# Patient Record
Sex: Female | Born: 1955 | Race: White | Marital: Married | Smoking: Former smoker
Health system: Northeastern US, Academic
[De-identification: ages and names within clinical notes are randomized; demographics above are authoritative.]

## PROBLEM LIST (undated history)

## (undated) DIAGNOSIS — N390 Urinary tract infection, site not specified: Secondary | ICD-10-CM

## (undated) DIAGNOSIS — N3010 Interstitial cystitis (chronic) without hematuria: Secondary | ICD-10-CM

## (undated) DIAGNOSIS — B019 Varicella without complication: Secondary | ICD-10-CM

## (undated) DIAGNOSIS — N189 Chronic kidney disease, unspecified: Secondary | ICD-10-CM

## (undated) DIAGNOSIS — H3581 Retinal edema: Secondary | ICD-10-CM

## (undated) HISTORY — DX: Chronic kidney disease, unspecified: N18.9

## (undated) HISTORY — PX: BUNIONECTOMY: SHX129

## (undated) HISTORY — DX: Varicella without complication: B01.9

## (undated) HISTORY — DX: Interstitial cystitis (chronic) without hematuria: N30.10

## (undated) HISTORY — PX: HX TONSILLECTOMY/ADENOIDECTOMY: SHX292

## (undated) HISTORY — DX: Retinal edema: H35.81

## (undated) HISTORY — PX: RETINAL DETACHMENT SURGERY: SHX105

## (undated) HISTORY — PX: OTHER SURGICAL HISTORY: SHX169

## (undated) HISTORY — PX: EYE SURGERY: SHX253

## (undated) HISTORY — DX: Urinary tract infection, site not specified: N39.0

---

## 2008-09-11 DIAGNOSIS — N3090 Cystitis, unspecified without hematuria: Secondary | ICD-10-CM | POA: Insufficient documentation

## 2009-05-16 ENCOUNTER — Encounter: Payer: Self-pay | Admitting: Cardiology

## 2009-05-16 HISTORY — PX: OTHER SURGICAL HISTORY: SHX169

## 2009-06-14 ENCOUNTER — Ambulatory Visit
Admit: 2009-06-14 | Discharge: 2009-06-14 | Disposition: A | Discharge status: Home / Self Care | Payer: Self-pay | Source: Ambulatory Visit | Attending: Primary Care | Admitting: Primary Care

## 2009-06-20 ENCOUNTER — Ambulatory Visit
Admit: 2009-06-20 | Discharge: 2009-06-20 | Disposition: A | Discharge status: Home / Self Care | Payer: Self-pay | Source: Ambulatory Visit | Attending: Retina Ophthalmology | Admitting: Retina Ophthalmology

## 2009-06-20 ENCOUNTER — Encounter: Payer: Self-pay | Admitting: Cardiovascular Disease

## 2009-06-20 ENCOUNTER — Ambulatory Visit
Admit: 2009-06-20 | Discharge: 2009-06-20 | Disposition: A | Discharge status: Home / Self Care | Payer: Self-pay | Source: Ambulatory Visit | Admitting: Retina Ophthalmology

## 2009-06-20 HISTORY — PX: OTHER SURGICAL HISTORY: SHX169

## 2009-06-21 ENCOUNTER — Ambulatory Visit
Admit: 2009-06-21 | Discharge: 2009-06-21 | Disposition: A | Discharge status: Home / Self Care | Payer: Self-pay | Source: Ambulatory Visit | Attending: Retina Ophthalmology | Admitting: Retina Ophthalmology

## 2009-06-25 ENCOUNTER — Ambulatory Visit: Payer: Self-pay | Admitting: Retina Ophthalmology

## 2009-07-01 ENCOUNTER — Ambulatory Visit: Payer: Self-pay | Admitting: Urology

## 2009-07-30 ENCOUNTER — Ambulatory Visit: Payer: Self-pay | Admitting: Retina Ophthalmology

## 2009-07-30 HISTORY — PX: OTHER SURGICAL HISTORY: SHX169

## 2009-08-27 ENCOUNTER — Ambulatory Visit: Payer: Self-pay | Admitting: Retina Ophthalmology

## 2009-10-08 ENCOUNTER — Ambulatory Visit: Payer: Self-pay | Admitting: Retina Ophthalmology

## 2009-10-18 ENCOUNTER — Encounter: Payer: Self-pay | Admitting: Gastroenterology

## 2009-10-18 ENCOUNTER — Ambulatory Visit: Payer: Self-pay | Admitting: Primary Care

## 2009-10-19 NOTE — H&P (Signed)
 Reason For Visit   Physical- patient also has cold sx.  cough,body aches, chills.  HPI   This patient is planning eye surgery on next Thursday.  Patient can  walk   up two flights of stairs without resting.  She does not smoke.  This   patient does not have a history of  chest   discomfort,cough,wheeze,edema,lightheadedness,palpitations.  She does not   have a history of heart failure, revascularization,cva,valvular heart   disease or lung disease.  She has never had profuse bleeding from small   wounds,after dental surgery or after surgery,excessive bruising, bleeding   into a muscle, frequent nosebleeds, bleeding into a joint, f amily members   with history of bleeding disorders or who bleed easily.  Medications and   supplements were reviewed.  There are no supplements taken except usana   products biomega, mega antioxidents, chelated minerals, visonex.  There are   no family members with severe fevers after anesthesia or other anesthesia   problems and this patient has had no post operative fevers.  [name] is   a 54 year woman who is here for a physical..  She never had trouble with   her blood pressure.  Her cholesterol has never been checked.  She never had   broken bones as an adult. Her height has not changed, it was 5'2".  Her   periods are regular. She uses partner vasectomy to prevent pregnancy.. She   has no trouble falling asleep and no trouble staying asleep. She has   nocturia : 1-2 times.     She is in a relationship.  She has 2  child, She has  pets 1 dog.  She is   currently sexually active with men.       [name] last had a pap in 2008.  She last had a mammogram in 2009.  She   has never had a dexa.  She last had a colonoscopy at about 46.  She is   supposed to have one every 10 years.      She works asoffice work.  She spends her free time walk dog, skidoo, read,   new house projects.   She always wears her seatbelt.  She last had a   tetanus shot 2002. She has never had a pneumovax.   She has never had hpv   vaccine.  [name] has quit smoking. She quit smoking 30    ago after   starting at age 33 and smoking 1/2 ppd. She drinks  alcohol weekly.  She   typically has 1-2 drinks at one time.  She uses no substances.  She    exercises  by walking two miles a day.     She denies depression or anxiety.  She has no history of interpersonal   violence.       She has not fallen since her last visit to the doctor.  Allergies   No Known Drug Allergy.  Current Meds   Elmiron 100 MG Capsule;TAKE 1 CAPSULE 3 TIMES DAILY.; Rx  Trimethoprim 100 MG Tablet;TAKE 1 TABLET DAILY; Rx  Tamsulosin HCl 0.4 MG Capsule;TAKE 1 CAPSULE DAILY; Rx  Detrol LA 4 MG Capsule Extended Release 24 Hour;TAKE 1 CAPSULE DAILY.; Rx  PrednisoLONE Acetate 1 % Suspension;; RPT  Durezol 0.05 % Emulsion;; RPT  HYDROmorphone HCl 2 MG Tablet;; RPT  Hydrocodone-Acetaminophen 5-500 MG Tablet;; RPT  Atropine Sulfate 1 % Solution;; RPT  Polymyxin B-Trimethoprim 10000-0.1 UNIT/ML-% Solution;; RPT.  Active Problems   Cystitis (595.9).  PSH   Cesarean Section  Hallux Valgus (Bunion) Correction; Bilateral  Tonsillectomy.  Family Hx   Paternal history of Diabetes Mellitus  Family history of Heart Disease  Family history of Hypertension.  Health Mgmt Plan   Colonoscopy every 10 years; for HEALTH MAINTENANCE  Mammogram every 1 year; for HEALTH MAINTENANCE; Overdue  Pap Smear every 3 years; for HEALTH MAINTENANCE; Overdue  Td Immunization every 10 years; for HEALTH MAINTENANCE.  Vital Signs   Recorded by Sprague,Courtney on 18 Oct 2009 09:41 AM  BP:128/72,   HR: 64 b/min,   Weight: 192.3 lb.  Physical Exam   GENERAL APPEARANCE: Normal Habitus.  Well-developed, well groomed.  Appears   stated age.  No acute distress.  Color good.  MENTAL STATUS: Appears alert and oriented.  Affect appropriate.  SKIN: Skin color and turgor normal.  No suspicious lesions, masses, rashes,   or ulcerations.  Nails and hair appear normal.  HEAD: Normocephalic.  EYE: normal  conjunctiva, pupils equal  EAR: External ear without scars, masses or lesions.   Acuity to   conversational tones good.  OROPHARYNX: Moist   NECK: Symmetric, trachea midline.  Full range of motion without pain or   tenderness.  Thyroid nontender without enlargement or masses.  Carotid   pulses normal without bruits.  No cervical lymph adenopathy.  CHEST: Respirations unlabored with normal diaphragmatic excursion.  Chest   wall symmetric with no masses.  Breath sounds clear bilaterally without   wheezes, rubs, rales, or rhonchi.  CV: Normal S1 and S2 without murmur or gallop or click.  Capillary refill   within two seconds.  No clubbing, cyanosis, or edema.    EXTREMITIES: Joints with full range of motion, without tenderness,   crepitance, or contracture.  No obvious joint deformities or effusions.  NEUROLOGICAL: Cranial nerves II through XII intact.  Motor strength   symmetrical with no obvious weakness.  Superficial sensation intact   bilaterally to light touch.  Observed dexterity without ataxia or tremor.    Gait coordinated and smooth.  Results   EKG NSR at 80 PR 0.16 QRS 0.08 STT nl st waves.  Assessment   DISCUSSION 54 year old with VIRAL URI, INTERSTITIAL CYSTITIS, CATARACTS     ASSESSMENT/PLAN      DISCUSSION VIRAL SYNDROME      cough  Xopenex two puffs  QID  MDI 2 puffs QIDspacer given  nighttime cough - Promethazine with codeine one to two teas every four to   six hours while trying to sleep     Ibuprofen 800 mg po Q8 hrs  as needed for discomfort or     Tylenol 650 mg po Q6 hrs  as needed for discomfort     Follow-up immediately for increased work of breathing, being out of it,   pain, or if without gradual improvement after 7 days.     INTERSTIAL CYTITIS - managment per urology, good control currently     CATARACTS - okay for surgery     HEALTH CARE MAINTENANCE  Cardiac Risk Factors -- there is no known coronary disease or no coronary   disease equivalent, no diabetes, no hyperglycemia, no hypertension,  no   family history of coronary artery disease, and no high cholesterol, no   tobacco or history of tobacco.  We will check a BMP/lipids/LFTs.     Vaccines -- last tetanus was 2002,  This  patient is too old for Gardisil and too young  for Pneumovax.       Exposures -- there is no history of sexually transmitted disease, no IV   drug abuse, no inhaled drugs, no incarceration, no homelessness, no   immigration, no group home living, no end-stage renal disease, no   gastrectomy.  Therefore there is no need for a PPD, HIV, hepatitis B or   hepatitis C check.       Female 40 to 64 with normal paps and single partner  Pap and Breast exam, Annual blood pressure, Random blood sugar, annual   lipids, Mammogram and dexa needed.     Patient is to follow-up soon for pap     Educational handouts given.  Nurse, mental health   Electronically signed by: Karen Kays  M.D.; 10/19/2009 1:55 PM EST.

## 2009-10-24 ENCOUNTER — Ambulatory Visit: Payer: Self-pay | Admitting: Retina Ophthalmology

## 2009-10-24 ENCOUNTER — Ambulatory Visit
Admit: 2009-10-24 | Discharge: 2009-10-24 | Disposition: A | Discharge status: Home / Self Care | Payer: Self-pay | Source: Ambulatory Visit | Attending: Retina Ophthalmology | Admitting: Retina Ophthalmology

## 2009-10-24 HISTORY — PX: CATARACT REMOVAL: SUR2

## 2009-10-24 HISTORY — PX: OTHER SURGICAL HISTORY: SHX169

## 2009-10-25 ENCOUNTER — Ambulatory Visit: Payer: Self-pay | Admitting: Retina Ophthalmology

## 2009-10-25 LAB — POCT URINE PREGNANCY (LAB): Preg Test,UR POC: NEGATIVE m[IU]/mL

## 2009-10-28 MED FILL — Atropine Sulfate Ophth Soln 1%: OPHTHALMIC | Qty: 2 | Status: AC

## 2009-10-29 ENCOUNTER — Ambulatory Visit
Admit: 2009-10-29 | Discharge: 2009-10-29 | Disposition: A | Discharge status: Home / Self Care | Payer: Self-pay | Source: Ambulatory Visit | Attending: Primary Care | Admitting: Primary Care

## 2009-10-29 LAB — HEPATIC FUNCTION PANEL
ALT: 27 U/L (ref 0–35)
AST: 27 U/L (ref 0–35)
Albumin: 4.2 g/dL (ref 3.5–5.2)
Alk Phos: 84 U/L (ref 35–105)
Bilirubin,Direct: <0.2 mg/dL (ref 0.0–0.3)
Bilirubin,Total: 0.4 mg/dL (ref 0.0–1.2)
Total Protein: 6.7 g/dL (ref 6.3–7.7)

## 2009-10-29 LAB — LIPID PANEL
Chol/HDL Ratio: 4.5
Cholesterol: 207 mg/dL — AB
HDL: 46 mg/dL
LDL Calculated: 117 mg/dL
Non HDL Cholesterol: 161 mg/dL
Triglycerides: 221 mg/dL — AB

## 2009-10-29 LAB — BASIC METABOLIC PANEL
Anion Gap: 11 (ref 7–16)
CO2: 25 mmol/L (ref 20–28)
Calcium: 9 mg/dL (ref 8.6–10.2)
Chloride: 103 mmol/L (ref 96–108)
Creatinine: 0.76 mg/dL (ref 0.51–0.95)
GFR,Black: >59 *
GFR,Caucasian: >59 *
Glucose: 92 mg/dL (ref 74–106)
Lab: 9 mg/dL (ref 6–20)
Potassium: 4.5 mmol/L (ref 3.3–5.1)
Sodium: 139 mmol/L (ref 133–145)

## 2009-10-30 LAB — HEMOGLOBIN A1C: Hemoglobin A1C: 6 % (ref 4.0–6.0)

## 2009-11-01 ENCOUNTER — Ambulatory Visit: Payer: Self-pay | Admitting: Retina Ophthalmology

## 2009-11-04 ENCOUNTER — Ambulatory Visit: Payer: Self-pay | Admitting: Primary Care

## 2009-11-04 NOTE — Progress Notes (Signed)
Reason For Visit   Veronica Bishop is a 54 year who presents for well Gyn visit.  Current Meds   Xopenex HFA 45 MCG/ACT Aerosol;INHALE 2 PUFFS EVERY 6 HOURS AS NEEDED.; Rx  Promethazine-Codeine 6.25-10 MG/5ML Syrup;1 to 2 teaspoons every four to   six hours while trying to sleep for cough; Rx  Detrol LA 4 MG Capsule Extended Release 24 Hour;TAKE 1 CAPSULE DAILY.; Rx  Tamsulosin HCl 0.4 MG Capsule;TAKE 1 CAPSULE DAILY; Rx  Elmiron 100 MG Capsule;TAKE 1 CAPSULE 3 TIMES DAILY.; Rx  Trimethoprim 100 MG Tablet;TAKE 1 TABLET DAILY; Rx.  Allergies   No Known Drug Allergy.  OB/GYN Hx   CATAMENIA:   --Cycle:   --Flow:      MENOPAUSE:   ; natural     SEXUAL HISTORY:  active   --# partners last 6 months: 1   --Current # of partners: 1   --Habits: female; vaginal   --Symptoms: none     STD HISTORY: none  HIV HISTORY: never   HIV RISK FACTORS: none      GU SYMPTOMS: none              --dysuria       --Incontinence - mixed   ; normal     BREAST SYMPTOMS:none  ; normal     ENDO SYMPTOMS: none.  Family Hx   Paternal history of Diabetes Mellitus  Family history of Heart Disease  Family history of Hypertension.  Vital Signs   Recorded by amccoy on 04 Nov 2009 07:55 AM  BP:120/62,   HR: 71 b/min,   Height: 62 in, Weight: 194.4 lb, BMI: 35.6 kg/m2,   O2 Sat: 96 (%SpO2).  Physical Exam   GENERAL APPEARANCE: Normal Habitus.  Well-developed, well groomed.  Appears   stated age.  No acute distress.  Color good.  MENTAL STATUS: Appears alert and oriented.  Affect appropriate.  SKIN: Skin color and turgor normal.  No suspicious lesions, masses, rashes,   or ulcerations.  Nails and hair appear normal.  HEAD: Normocephalic.  EYE: normal conjunctiva, pupils equal and reactive, fundi benign  EAR: External ear without scars, masses or lesions.  External auditory   canal intact, clear, and without lesions.  Tympanic membranes intact with   normal light reflex and landmarks.  Acuity to conversational tones good.  MOUTH: Teeth in good repair.  Gums pink without  lesions.  Normal appearing   mucosa, palate, and tongue.  OROPHARYNX: Moist without exudate, erythema, or swelling.  NECK: Symmetric, trachea midline.  Full range of motion without pain or   tenderness.  Thyroid nontender without enlargement or masses.  Carotid   pulses normal without bruits.  No cervical lymph adenopathy.  CHEST: Respirations unlabored with normal diaphragmatic excursion.  Chest   wall symmetric with no masses.  Breath sounds clear bilaterally without   wheezes, rubs, rales, or rhonchi.  BREAST: normal without masses or discharge, without skin changes  CV: Normal S1 and S2 without murmur or gallop or click.  Capillary refill   within two seconds.  No clubbing, cyanosis, or edema.  No varicosities.    Radial, femoral, dorsalis pedis, and posterior tibial pulses full and   symmetrical.  GI/ABDOMEN: Abdomen soft with normal bowel sounds.  No guarding or rebound.    No palpable masses or tenderness.  Liver and spleen are without tenderness   or enlargement.  No aortic widening.  No inguinal adenopathy.  GU: Normal vulva and introitus without masses lesions, rashes or  swelling.    Vaginal mucosa and cervix pink and moist without exudate, lesions, or   ulcerations.  Uterus and adnexae palpable and not enlarged, nontender, and   without masses.    RECTAL: Perineum and anus without lesions, masses or hemorrhoids.    EXTREMITIES: Joints with full range of motion, without tenderness,   crepitance, or contracture.  No obvious joint deformities or effusions.  NEUROLOGICAL: Cranial nerves II through XII intact.  Motor strength   symmetrical with no obvious weakness.  Superficial sensation intact   bilaterally to light touch.  Observed dexterity without ataxia or tremor.    Gait coordinated and smooth.     .  Results   BASIC METABOLIC PROFILE - BMP   29 Oct 2009 08:41 AM  -   SODIUM: 139 mmol/L  -   UREA NITROGEN: 9 mg/dL  -   CREATININE: 1.61 mg/dL  -   GLUCOSE: 92 mg/dL  -   CALCIUM: 9.0 mg/dL  HEPATIC  FUNCTION PROFILE - LIVER   29 Oct 2009 08:41 AM  -   AST: 27 u/l  -   ALT: 27 u/l  LIPID PROFILE - LIPID   29 Oct 2009 08:41 AM  -   CHOLESTEROL: 207 mg/dL  -   TRIGLYCERIDES: 096 mg/dL  -   HDL: 46 mg/dL  -   LDL (CALC): 045 mg/dL  -   CHOL/HDL RATIO: 4.5   -   NON HDL CHOLESTEROL: 161 mg/dL  HEMOGLOBIN W0J - [id number]   29 Oct 2009 08:41 AM  -   HGB A1C: 6.0 %.  Assessment/Plan   DISCUSSION 54 year old with INTERSTITIAL CYSTITIS, RETINAL DETATCHMENTS,   CATARACTS     pap done.       preDM - encouraged to watch carbs and exercise.  Will recheck HgA1c and   lipids in six months.  LDL goal would be less than 90.  currently 117.  Signature   Electronically signed by: Karen Kays  M.D.; 11/04/2009 11:27 AM EST.

## 2009-11-06 LAB — GYN CYTOLOGY

## 2009-12-09 ENCOUNTER — Ambulatory Visit
Admit: 2009-12-09 | Discharge: 2009-12-09 | Disposition: A | Discharge status: Home / Self Care | Payer: Self-pay | Source: Ambulatory Visit | Attending: Urology | Admitting: Urology

## 2009-12-10 ENCOUNTER — Ambulatory Visit: Payer: Self-pay | Admitting: Retina Ophthalmology

## 2009-12-11 LAB — AEROBIC CULTURE

## 2009-12-30 ENCOUNTER — Ambulatory Visit: Payer: Self-pay | Admitting: Urology

## 2009-12-31 LAB — AEROBIC CULTURE: Aerobic Culture: 0

## 2010-01-17 ENCOUNTER — Ambulatory Visit: Payer: Self-pay

## 2010-01-17 ENCOUNTER — Ambulatory Visit: Payer: Self-pay | Admitting: Retina Ophthalmology

## 2010-02-26 ENCOUNTER — Ambulatory Visit
Admit: 2010-02-26 | Discharge: 2010-02-26 | Disposition: A | Discharge status: Home / Self Care | Payer: Self-pay | Source: Ambulatory Visit | Attending: Urology | Admitting: Urology

## 2010-02-28 ENCOUNTER — Ambulatory Visit: Payer: Self-pay | Admitting: Retina Ophthalmology

## 2010-02-28 LAB — AEROBIC CULTURE

## 2010-03-13 ENCOUNTER — Ambulatory Visit
Admit: 2010-03-13 | Discharge: 2010-03-13 | Disposition: A | Discharge status: Home / Self Care | Payer: Self-pay | Source: Ambulatory Visit | Attending: Urology | Admitting: Urology

## 2010-03-15 LAB — AEROBIC CULTURE

## 2010-04-03 ENCOUNTER — Ambulatory Visit: Payer: Self-pay | Admitting: Primary Care

## 2010-04-03 ENCOUNTER — Ambulatory Visit
Admit: 2010-04-03 | Discharge: 2010-04-03 | Disposition: A | Discharge status: Home / Self Care | Payer: Self-pay | Source: Ambulatory Visit | Attending: Primary Care | Admitting: Primary Care

## 2010-04-03 LAB — BASIC METABOLIC PANEL
Anion Gap: 8 (ref 7–16)
CO2: 27 mmol/L (ref 20–28)
Calcium: 9.2 mg/dL (ref 8.6–10.2)
Chloride: 105 mmol/L (ref 96–108)
Creatinine: 0.77 mg/dL (ref 0.51–0.95)
GFR,Black: >59 *
GFR,Caucasian: >59 *
Glucose: 88 mg/dL (ref 74–106)
Lab: 9 mg/dL (ref 6–20)
Potassium: 4.4 mmol/L (ref 3.3–5.1)
Sodium: 140 mmol/L (ref 133–145)

## 2010-04-04 LAB — HEMOGLOBIN A1C: Hemoglobin A1C: 5.9 % (ref 4.0–6.0)

## 2010-04-11 ENCOUNTER — Ambulatory Visit: Payer: Self-pay | Admitting: Primary Care

## 2010-04-12 NOTE — Progress Notes (Signed)
Reason For Visit   Follow up lab results.  HPI   Daughter got engaged.  Moving to [place].  Got laid off in May.  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  Had anaphylaxis.    She thinks it was the soap in a towel.     This patient is here to follow up on the following chronic problems:     DYSLIPIDEMIA   She is watching her diet.     preDIABETES    She saw ophthalmology and is on multiple eye drops.  She has trouble with   her vision.  She is exercising more.  She is watching her diet carefully,   her fiance is watching his diet due to htn.        She sees Dr. Ron Agee for her bladder problems and is developing increasing   microbladder and will have to have urethral dilitation but needs dense   sedation for this.  Allergies   Sulfa Drugs.  Current Meds   Promethazine-Codeine 6.25-10 MG/5ML Syrup;1 to 2 teaspoons every four to   six hours while trying to sleep for cough; Rx  Xopenex HFA 45 MCG/ACT Aerosol;INHALE 2 PUFFS EVERY 6 HOURS AS NEEDED.; Rx  Ciprofloxacin HCl 500 MG Tablet;TAKE 1 TABLET EVERY 12 HOURS DAILY.; Rx  Ciprofloxacin HCl 500 MG Tablet;TAKE 1 TABLET EVERY 12 HOURS; Rx  Ciprofloxacin HCl 500 MG Tablet;TAKE 1 TABLET EVERY 12 HOURS; Rx  Nitrofurantoin Monohyd Macro 100 MG Capsule;TAKE 1 CAPSULE EVERY 12 HOURS   DAILY.; Rx  Phenazopyridine HCl 200 MG Tablet;TAKE 1 TABLET 3 TIMES DAILY AFTER MEALS.;   Rx  Trimethoprim 100 MG Tablet;TAKE 1 TABLET DAILY; Rx  Ketorolac Tromethamine 0.5 % Solution;; RPT  Tamsulosin HCl 0.4 MG Capsule;TAKE 1 CAPSULE DAILY; Rx  Detrol LA 4 MG Capsule Extended Release 24 Hour;TAKE 1 CAPSULE DAILY; Rx  Elmiron 100 MG Capsule;TAKE 1 CAPSULE 3 TIMES DAILY.; Rx.  Active Problems   Cystitis (595.9).  ROS   This patient denies side effects  Does NOT check blood pressure.  Has NO chest pain  Has NO sob  Labs were drawn.  Has nocturia zero to one times.  No foot pain, no broken skin or sores on the feet.  .  Vital Signs   Recorded by Martorana,Christina on 11 Apr 2010 02:06 PM  BP:122/72,   HR: 80 b/min,  L Radial, Normal,   Height: 62 in, Weight: 194.2 lb, BMI: 35.5 kg/m2.  Results   BASIC METABOLIC PROFILE - BMP   03 Apr 2010 09:43 AM  -   UREA NITROGEN: 9 mg/dL  -   CREATININE: 1.61 mg/dL  -   GLUCOSE: 88 mg/dL  HEMOGLOBIN W9U - EAV4U   03 Apr 2010 09:43 AM  -   HGB A1C: 5.9 %  BASIC METABOLIC PROFILE - BMP   29 Oct 2009 08:41 AM  -   UREA NITROGEN: 9 mg/dL  -   CREATININE: 9.81 mg/dL  HEPATIC FUNCTION PROFILE - LIVER   29 Oct 2009 08:41 AM  -   AST: 27 u/l  -   ALT: 27 u/l  LIPID PROFILE - LIPID   29 Oct 2009 08:41 AM  -   CHOLESTEROL: 207 mg/dL  -   TRIGLYCERIDES: 191 mg/dL  -   HDL: 46 mg/dL  -   LDL (CALC): 478 mg/dL  -   CHOL/HDL RATIO: 4.5   -   NON HDL CHOLESTEROL: 161 mg/dL  HEMOGLOBIN A1C - HBA1C   29 Oct 2009 08:41 AM  -   HGB A1C: 6.0 %.  Assessment   DISCUSSION 54 YO with ANAPHYLAXIS, CHRONIC CYSTITIS, DYSLIPIDEMIA, preDM     ANAPHYLAXIS - epipen prescribed.  It isn't clear what she is allergic to.     CHRONIC CYSTITIS - evaluation per Dr. Ron Agee; may need dilitation of   urethra     DYSLIPIDEMIA - triglycerides are a bit high and watch diet and exercise.     pre DM - watch diet and exercise.  Slight improvement 5.9 from 6.0.    Recheck in six months.     HCM - needs mamogram.  Signature   Electronically signed by: Karen Kays  M.D.; 04/12/2010 4:48 PM EST.

## 2010-04-14 ENCOUNTER — Ambulatory Visit
Admit: 2010-04-14 | Discharge: 2010-04-14 | Disposition: A | Discharge status: Home / Self Care | Payer: Self-pay | Source: Ambulatory Visit | Attending: Urology | Admitting: Urology

## 2010-04-15 LAB — AEROBIC CULTURE

## 2010-05-01 ENCOUNTER — Ambulatory Visit
Admit: 2010-05-01 | Discharge: 2010-05-01 | Disposition: A | Discharge status: Home / Self Care | Payer: Self-pay | Source: Ambulatory Visit | Attending: Urology | Admitting: Urology

## 2010-05-04 LAB — AEROBIC CULTURE

## 2010-05-14 ENCOUNTER — Ambulatory Visit: Payer: Self-pay | Admitting: Urology

## 2010-05-15 LAB — AEROBIC CULTURE: Aerobic Culture: 0

## 2010-05-30 ENCOUNTER — Ambulatory Visit: Payer: Self-pay | Admitting: Retina Ophthalmology

## 2010-05-30 HISTORY — PX: OTHER SURGICAL HISTORY: SHX169

## 2010-06-25 ENCOUNTER — Ambulatory Visit: Payer: Self-pay | Admitting: Urology

## 2010-07-02 ENCOUNTER — Ambulatory Visit: Payer: Self-pay | Admitting: Urology

## 2010-07-11 ENCOUNTER — Ambulatory Visit: Payer: Self-pay | Admitting: Retina Ophthalmology

## 2010-07-11 ENCOUNTER — Encounter: Payer: Self-pay | Admitting: Retina Ophthalmology

## 2010-07-21 ENCOUNTER — Ambulatory Visit
Admit: 2010-07-21 | Discharge: 2010-07-21 | Disposition: A | Discharge status: Home / Self Care | Payer: Self-pay | Source: Ambulatory Visit | Attending: Urology | Admitting: Urology

## 2010-07-22 LAB — AEROBIC CULTURE: Aerobic Culture: 0

## 2010-08-11 ENCOUNTER — Ambulatory Visit: Payer: Self-pay | Admitting: Urology

## 2010-08-12 LAB — AEROBIC CULTURE: Aerobic Culture: 0

## 2010-09-01 NOTE — Miscellaneous (Unsigned)
 Continuity of Care Record  Created: todo  From: ,   From:   From: [employer], EHR v10.2.7.53  To: Warnell Bureau  Purpose: Patient Use;       Problems  Diagnosis: Cystitis (595.9)     Family History  Paternal history of Diabetes Mellitus (V18.0)   Family history of Heart Disease (V17.49)   Family history of Hypertension (V17.49)     Alerts  Allergy - Sulfa Drugs     Medications  ALPRAZolam 0.25 MG Tablet; 1 tablet night before procedure.  Repeat morning   of procedure. ; Rx   Cephalexin 250 MG Capsule; TAKE 1 TABLET ONCE DAILY. ; Rx   Detrol LA 4 MG Capsule Extended Release 24 Hour; TAKE 1 CAPSULE DAILY. ; Rx   Elmiron 100 MG Capsule; TAKE 1 CAPSULE 3 TIMES DAILY. ; Rx   EpiPen 0.3 MG/0.3ML Device; INJECT 0.3ML INTRAMUSCULARLY AS DIRECTED ; Rx   Ketorolac Tromethamine 0.5 % Solution ; RPT   Monistat 3 200-2 MG-% (9GM) Kit; INSERT 1 APPLICATOR DAILY ; Rx   Nitrofurantoin Monohyd Macro 100 MG Capsule; TAKE 1 CAPSULE TWICE DAILY. ;   Rx   Promethazine-Codeine 6.25-10 MG/5ML Syrup; 1 to 2 teaspoons every four to   six hours while trying to sleep for cough ; Rx   Tamsulosin HCl 0.4 MG Capsule; TAKE 1 CAPSULE DAILY ; Rx   Xopenex HFA 45 MCG/ACT Aerosol; INHALE 2 PUFFS EVERY 6 HOURS AS NEEDED. ;   Rx     Immunizations  Tdap (Adacel)

## 2010-09-01 NOTE — Letter (Signed)
May 14, 2010    Karen Kays, MD  [address]  [address]  [place], [place]  [address]      RE:   Veronica Bishop, Veronica Bishop  DOB:  1956-01-12  Unit#: [id number]    Dear Dr. Erling Cruz:    I saw [name] in the office on May 14, 2010, for follow-up of her  urinary tract infections and interstitial cystitis.  She was feeling that  she did need to be dilated.  Because of her tremendous anxiety over this  procedure, I did have her premedicate with Xanax.  She wound up tolerating  urethral dilation quite well.  Findings showed no evidence of any specific  intravesical pathology.    She is not on Elmiron at this point due to cost and lack of perceived  benefit.  She is using tamsulosin, which she does find to be helpful.  Urine cultures have recently shown recurrent infections with different  organisms, including enterococcus, klebsiella, and E. coli.  She is on  Trimpex every other day.  This obviously is not helping prevent infection.  I have suggested to her that she discontinue Trimpex and begin Keflex at  250 mg daily.  I have sent a repeat urine culture.  She is currently on  Macrobid for an established urinary tract infection.  She will begin the  Keflex once Trimpex is completed.    Thank you again, Dr. Erling Cruz, for the opportunity to continue to assist in  [name]'s care.  I will keep you advised of her progress.      With Best Personal Regards,              Electronically Signed and Finalized by  [name], MD 05/19/2010 05:42  ____________________________________  [name], MD      DD:   05/15/2010  DT:   05/15/2010 12:45 P  DVI:  [id number]  [id number]    cc:   Karen Kays, MD

## 2010-09-01 NOTE — Letter (Signed)
 August 11, 2010    Karen Kays, MD  [address]  [address]  [place], [place]  [address]      RE:   Veronica Bishop, Veronica Bishop  DOB:  1956-08-23  Unit#: [id number]    Dear Dr. Erling Cruz:    I saw Veronica Bishop in the office on 08/11/2010 for followup of her urinary  tract infections and her interstitial cystitis.  She has had no  breakthrough infections on Keflex at 250 mg once daily.  We will,  therefore, begin tapering her antibiotics.  She will drop down to every  other day at this point.  If after 1 month she is doing well, she will  decrease this further to Mondays and Thursdays.    With respect to her interstitial cystitis, she very definitely feels that  hydrodistention of her bladder was helpful.  She is currently using  tamsulosin and Detrol, both of which she finds useful.  She was requesting  a repeat trial of Elmiron.  I have provided her with a prescription.  I  will follow up with her in 3 months.    Thank you very much, Dr. Erling Cruz, for the opportunity to continue to assist  in [name] care.  I will continue to update you on her progress.      Sincerely,              Electronically Signed and Finalized by  [name], MD 08/14/2010 21:07  ____________________________________  [name], MD      DD:   08/12/2010  DT:   08/13/2010 10:56 A  DVI:  [id number]  [id number]    cc:   Karen Kays, MD

## 2010-09-01 NOTE — Letter (Signed)
Dec 30, 2009    Karen Kays, MD  [address]  [address]  [place], [place]  [address]      RE:   Veronica Bishop, [name]  DOB:  Sep 22, 1955  Unit#: [id number]    Dear Dr. Erling Cruz:    [name] was in the office today in follow-up for interstitial cystitis,  overactive bladder, elevated residual urine, and urinary tract infections.  She is, at this point, on a combination of Elmiron at 100 mg 3 times daily,  Detrol LA 4 mg once daily, and tamsulosin at 0.4 mg daily.  This  combination is keeping her symptoms under good control.  Of the 3  medications, she finds the Detrol to be the most critical.    She is experiencing rare episodes of dyspareunia.  Frequency and urgency  are under good control if she remains on the medications.  Nocturia is  averaging once per night.  Since her last visit with me in November, she  has had only a single urinary tract infection with E. coli on December 09, 2009.  This did follow sexual activity, she believes.  She has been using  Trimpex at 100 mg every other day.    Physical examination reveals no CVA tenderness.   Her abdomen is soft and  benign.  There is no evidence of bladder distention.  Her residual urine by  bladder scan was 103 cc, which is basically stable.    I have suggested to [name] that she continue the medications as  outlined above.  She does go on camping trips.  I have provided her with  prescriptions for Cipro, Macrobid, and Pyridium for those times, in case  she develops an infection while out of the [place] area.    Thank you again, Dr. Erling Cruz, for the opportunity to continue to assist in  Veronica Bishop's care.      With Best Personal Regards,              Electronically Signed and Finalized by  [name], MD 01/01/2010 06:48  ____________________________________  [name], MD      DD:   12/30/2009  DT:   12/31/2009  1:53 P  DVI:  [id number]  [id number]    cc:   Karen Kays, MD

## 2010-09-10 ENCOUNTER — Encounter: Payer: Self-pay | Admitting: Primary Care

## 2010-09-10 ENCOUNTER — Ambulatory Visit: Payer: Self-pay | Admitting: Primary Care

## 2010-09-14 NOTE — Progress Notes (Signed)
Message   Patient is here for swollen gland no sore  throat, and feeling of run down,   ears are plugged.  HPI   Veronica Bishop has popping ears, swollen glands pain with opening mouth and with   swallowing.  She has no fever, no runny nose, no headache.  She has no   cough, no n/v/d.  Current Meds   Promethazine-Codeine 6.25-10 MG/5ML Syrup;1 to 2 teaspoons every four to   six hours while trying to sleep for cough; Rx  Xopenex HFA 45 MCG/ACT Aerosol;INHALE 2 PUFFS EVERY 6 HOURS AS NEEDED.; Rx  Elmiron 100 MG Capsule;TAKE 1 CAPSULE 3 TIMES DAILY.; Rx  Tamsulosin HCl 0.4 MG Capsule;TAKE 1 CAPSULE DAILY; Rx  Detrol LA 4 MG Capsule Extended Release 24 Hour;TAKE 1 CAPSULE DAILY.; Rx  Cephalexin 250 MG Capsule;TAKE 1 TABLET ONCE DAILY.; Rx  Monistat 3 200-2 MG-% (9GM) Kit;INSERT 1 APPLICATOR DAILY; Rx  ALPRAZolam 0.25 MG Tablet;1 tablet night before procedure.  Repeat morning   of procedure.; Rx  Ketorolac Tromethamine 0.5 % Solution;; RPT  EpiPen 0.3 MG/0.3ML DEVI;INJECT 0.3ML INTRAMUSCULARLY AS DIRECTED; Rx.  Active Problems   Cystitis (595.9).  Family Hx   Paternal history of Diabetes Mellitus  Family history of Heart Disease  Family history of Hypertension.  Vital Signs   Recorded by amccoy on 10 Sep 2010 08:09 AM  BP:110/70,   HR: 81 b/min,   Height: 62 in, Weight: 197.8 lb, BMI: 36.2 kg/m2,   O2 Sat: 97 (%SpO2).  Physical Exam   GENERAL APPEARANCE: appears stated age, tired  EYES: conjunctiva benign   EARS: canals normal TM gray and shiny  MOUTH: wet, no exudate, oropharynx clear. Parotid glands swollen and tender   bilaterally.  NECK: supple, 2 to 2 cm adenapathy in the anterior cervical chain  LUNGS: Clear to auscultation, no wheezing or crackles. No retractions.    HEART: regular rhythm and rate with no murmurs, JVP flat  EXTREMITIES: Without clubbing, cyanosis, or edema  NEUROLOGIC: Alert and oriented times three, cranial nerves II through XII   intact by observation, motor/sensory exam appear normal, normal gait.  SKIN:   no rash  PSYCH: alert, well groomed, good eye contact.  Assessment   DISCUSSION 55 yo with bilateral PAROTITIS cw VIRAL ETIOLOGY     Ibuprofen 800 tid prn, fu for fever, redness of glands, increased pain     HCM - tetanus given, needs mammogram.  Health Mgmt Plan   HIV Testing-declined every 1 year; for HEALTH MAINTENANCE.  Signature   Electronically signed by: Karen Kays  M.D.; 09/14/2010 4:06 PM EST.

## 2010-10-10 ENCOUNTER — Ambulatory Visit: Payer: Self-pay | Admitting: Retina Ophthalmology

## 2010-11-10 ENCOUNTER — Ambulatory Visit: Payer: Self-pay | Admitting: Urology

## 2010-12-05 ENCOUNTER — Ambulatory Visit: Payer: Self-pay | Admitting: Retina Ophthalmology

## 2010-12-25 ENCOUNTER — Ambulatory Visit: Payer: Self-pay

## 2010-12-25 ENCOUNTER — Encounter: Payer: Self-pay | Admitting: Retina Ophthalmology

## 2010-12-28 LAB — AEROBIC CULTURE

## 2011-01-02 ENCOUNTER — Ambulatory Visit: Payer: Self-pay | Admitting: Retina Ophthalmology

## 2011-02-16 ENCOUNTER — Encounter: Payer: Self-pay | Admitting: Urology

## 2011-02-16 ENCOUNTER — Ambulatory Visit: Payer: Self-pay | Admitting: Urology

## 2011-02-16 DIAGNOSIS — N3010 Interstitial cystitis (chronic) without hematuria: Secondary | ICD-10-CM

## 2011-02-16 DIAGNOSIS — N3281 Overactive bladder: Secondary | ICD-10-CM

## 2011-02-16 DIAGNOSIS — N390 Urinary tract infection, site not specified: Secondary | ICD-10-CM

## 2011-02-16 LAB — POCT URINALYSIS DIPSTICK
Bilirubin,Ur: NEGATIVE
Blood,UA: NEGATIVE
Glucose,UA: NORMAL
Ketones, UA: NEGATIVE
Leuk Esterase,UA: NEGATIVE
Lot #: 21002501
Nitrite,UA POCT: NEGATIVE
PH,Ur: 5
Protein,UA: NEGATIVE
Specific gravity,UA POCT: 1.025 (ref 1.002–1.030)
Urobilinogen,UA: NORMAL

## 2011-02-16 LAB — POCT BLADDER SCAN PVR: Residual mL: 126

## 2011-02-16 MED ORDER — NITROFURANTOIN MONOHYD MACRO 100 MG PO CAPS *I*
100.0000 mg | ORAL_CAPSULE | Freq: Two times a day (BID) | ORAL | Status: DC
Start: 2011-02-16 — End: 2011-06-29

## 2011-02-16 NOTE — Progress Notes (Signed)
Visit Diagnosis(es) or CC:  UTIs, IC, OAB    HPI:  Had two UTIs in [place] in February/March.  Put on antibiotics.  Did not improve.  When returned to [place], we placed her on macrobid.  "I've been fine since."  "I think they didn't check the cultures."  Using antibiotic after intercourse only.  Latest culture showed E-Coli and Enterococcus, both of which were sensitive to macrodantin.  Also on elmiron, tamsulosin, and detrol.  Voiding symptoms are "truly minimal.  This is working."  On keflex 250 mgs after intercourse only.        PMH/PSH:  No changes reported      FH:  No new cancer diagnoses  SH:  Getting married on 02-21-11.  Going to [place] for honeymoon      Review of Systems:    Systemic: Denies recent weight loss, weight gain, or fatigue.  Eyes: Denies change in vision.  ENT: Denies hearing problems, or loss. Denies swollen glands or stiff neck.  Denies sore throat.  Respiratory: Denies recent cold, flu or upper respiratory illness. Denies cough or dyspnea.  Denies SOB.  Heart: Denies recent "heart trouble," chest pain, or palpitations.  GI: Denies constipation, diarrhea, acid reflux, or bloody stool.  GU: Denies urogenital complaints, apart from as documented in HPI.  Denies hematuria.  Musculoskeletal: Denies any new muscle or joint pain, stiffness, or arthritis.  Skin: Denies rash, ulcers, or skin problems.  Neuro: Denies numbness or tingling of the extremities.  Denies dizziness or headaches.  Pysch: Denies depression, anxiety, or suicidal thoughts.  Endocrine: Denies diabetes, excessive thirst, or excessive urination.  Hematology: Denies recent bleeding, bruising, or anemia.  Allergy/Immunology: Denies runny nose, allergic rhinitis, or itching eyes.     Objective:    GENERAL:  No acute distress.  Well developed, well nourished.  NEUROLOGIC:  Oriented to person, place, time, and situation  PSYCHIATRIC:  Normal mood and affect  HEENT:  Normocephalic, atraumatic.  Sclerae anicteric.  Nose/mouth  without lesions.  NECK:  Trachea midline.  No JVD in sitting position.    SKIN:  Normal color, turgor, texture, hydration  LUNGS:  Respirations unlabored.    HEART:  Regular rate and rhythm    BACK/ORTHO:  No costovertebral angle tenderness.  No obvious back deformities.  No tenderness of the axial skeleton.    ABDOMEN:  Abdomen soft, nontender, nondistended, and without masses.  No evidence of bladder distention.  No inguinal hernias.  EXTREMITIES:  No evidence of cyanosis, clubbing, or edema. Calves nontender    PVR   PVR = 126 ccs       Assessment:   IC - compensated   OAB - compensated   UTIs - persistent      Plan:   Check urine culture   Rx for macrobid to take to [place].   Continue other medications above.      Follow-Up:   3 months.    LOS:      [id number], [id number]    Time spent in counseling and coordination of care:

## 2011-02-17 ENCOUNTER — Encounter: Payer: Self-pay | Admitting: Urology

## 2011-02-17 DIAGNOSIS — N3281 Overactive bladder: Secondary | ICD-10-CM | POA: Insufficient documentation

## 2011-02-17 DIAGNOSIS — N390 Urinary tract infection, site not specified: Secondary | ICD-10-CM | POA: Insufficient documentation

## 2011-02-17 LAB — AEROBIC CULTURE: Aerobic Culture: 0

## 2011-02-20 ENCOUNTER — Encounter: Payer: Self-pay | Admitting: Retina Ophthalmology

## 2011-02-20 ENCOUNTER — Ambulatory Visit: Payer: Self-pay | Admitting: Retina Ophthalmology

## 2011-02-20 DIAGNOSIS — H35352 Cystoid macular degeneration, left eye: Secondary | ICD-10-CM | POA: Insufficient documentation

## 2011-02-20 NOTE — Assessment & Plan Note (Signed)
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   NO RESPONSE TO TOPICALS OR IVT.  Visual potential about 20/150  F/u 4-6 weeks check photos, FA, possible Avastin OS

## 2011-03-24 ENCOUNTER — Ambulatory Visit: Payer: Self-pay

## 2011-03-24 DIAGNOSIS — N390 Urinary tract infection, site not specified: Secondary | ICD-10-CM

## 2011-03-24 DIAGNOSIS — Z1389 Encounter for screening for other disorder: Secondary | ICD-10-CM

## 2011-03-24 LAB — POCT URINALYSIS DIPSTICK
Bilirubin,Ur: NEGATIVE
Blood,UA POCT: NEGATIVE
Glucose,UA POCT: NORMAL
Ketones,UA POCT: NEGATIVE
Leuk Esterase,UA POCT: 2 — AB
Lot #: 21155402
Nitrite,UA POCT: NEGATIVE
PH,UA POCT: 5 (ref 5–8)
Specific gravity,UA POCT: 1.02 (ref 1.002–1.03)
Urobilinogen,UA: NORMAL

## 2011-03-25 LAB — AEROBIC CULTURE: Aerobic Culture: 0

## 2011-03-27 ENCOUNTER — Ambulatory Visit: Payer: Self-pay | Admitting: Retina Ophthalmology

## 2011-03-27 ENCOUNTER — Encounter: Payer: Self-pay | Admitting: Retina Ophthalmology

## 2011-03-27 ENCOUNTER — Ambulatory Visit: Payer: Self-pay

## 2011-03-27 DIAGNOSIS — H35352 Cystoid macular degeneration, left eye: Secondary | ICD-10-CM

## 2011-03-27 DIAGNOSIS — H527 Unspecified disorder of refraction: Secondary | ICD-10-CM

## 2011-03-27 HISTORY — DX: Unspecified disorder of refraction: H52.7

## 2011-03-27 NOTE — Progress Notes (Signed)
Subjective:   Subjective8/10/2010   Chief Complaint   Patient presents with   . Other     Pt reported Va the same+ sometimes see like a window shade coming down VA OS Started in the last 2 months         Current Outpatient Rx   Name Route Sig Dispense Refill   . NITROFURANTOIN MONOHYD MACRO 100 MG PO CAPS   *SH* Oral Take 1 capsule (100 mg total) by mouth 2 times daily   30 capsule 3   . CEPHALEXIN 250 MG PO CAPS   *SH* Oral TAKE 1 TABLET ONCE DAILY. 90 4     Created by Conversion   . DETROL LA 4 MG PO CP24 Oral TAKE 1 CAPSULE DAILY. 90 4     Created by Conversion   . TAMSULOSIN HCL 0.4 MG PO CAPS   *SH* Oral TAKE 1 CAPSULE DAILY 90 4     Created by Conversion   . ELMIRON 100 MG PO CAPS Oral TAKE 1 CAPSULE 3 TIMES DAILY. 270 4     Created by Conversion   . MONISTAT 3 200-2 MG-% (9GM) VA KIT Vaginal INSERT 1 APPLICATOR DAILY 7 2     Created by Conversion   . ALPRAZOLAM 0.25 MG PO TABS   *SH* Oral 1 tablet night before procedure.  Repeat morning of procedure. 2 0     Created by Conversion   . EPIPEN 0.3 MG/0.3ML IJ DEVI Injection INJECT 0.3ML INTRAMUSCULARLY AS DIRECTED 2 5     Created by Conversion   . KETOROLAC TROMETHAMINE 0.5 % OP SOLN   *SH* Ophthalmic Apply  to eye. 5 0     Dispense as written.     Created by Conversion   . PROMETHAZINE-CODEINE 6.25-10 MG/5ML PO SYRP *A* Oral 1 to 2 teaspoons every four to six hours while trying to sleep for cough 300 0     Created by Conversion   . XOPENEX HFA 45 MCG/ACT IN AERO Inhalation INHALE 2 PUFFS EVERY 6 HOURS AS NEEDED. 15 0     Created by Conversion      Sulfa drugs   Past Medical History   Diagnosis Date   . Macular edema      PERSISITENT PSEUDOPHAKIC OS   . Cataract    . Refractive error 03/27/2011      Past Surgical History   Procedure Date   . Hx tonsillectomy/adenoidectomy      Tonsillectomy Conversion Data    . Cesarean section, classic      Cesarean Section Conversion Data    . Bunionectomy      Hallux Valgus (Bunion) Correction Conversion Data    . Cataract  removal 10/24/09     PC IOL OS   . Retinal detachment surgery      X 3   . 25g ppv/ivt/afx/el/22%sf6 os 05/16/09   . Sb/20g ppv/mo/pfc/rtx/afx/15% c3 f8 os 06/20/09   . Barrier lpc os 07/30/09   . Phaco/iol/25g ppv/pcox/mp/afx/icg/ilmx/14% c3 f8 os 10/24/09   . Intra vitreal triesence os 05/30/10     4MG       History   Smoking status   . Former Smoker   . Types: Cigarettes   . Quit date: 02/15/1981   Smokeless tobacco   . Not on file      History   Alcohol Use   . Yes      History   Drug Use No      Specialty Problems  Ophthalmology Problems    Cataract        Cystoid macular edema of left eye               ROS     Positive for: Eyes ( phakic ME OS, RD x3, NVS Cat OD), Allergic/Imm (   sulfa)    Negative for: Constitutional, Gastrointestinal, Neurological, Skin,   Genitourinary, Musculoskeletal, HENT, Endocrine, Cardiovascular,   Respiratory, Psychiatric, Heme/Lymph       Objective:   Objective  Filed Vitals:    03/27/11 1041   BP: 119/78   Pulse: 83   Height: 1.575 m (5\' 2" )   Weight: 81.647 kg (180 lb)       Base Ophthalmology Exam        Visual Acuity    Right Left   Dist cc 20/20 20/200 -1   Method: Snellen - Linear   Correction: Glasses   Tonometry    Right Left   Pressure 20 17   Method: Tonopen   Time: 10:47 AM   Dilation   Left eye: 2.5% Phenylephrine, 1.0% Tropicamide @ 10:47 AM   Pupils    Pupils Dark   Right PERRLA 4   Left PERRLA 4.5      Main Ophthalmology Exam        External Exam    Right Left   External Normal Normal   Slit Lamp Exam    Right Left   Lids/Lashes Normal Normal   Conjunctiva/Sclera White and quiet White and quiet   Cornea Clear Clear   Anterior Chamber Deep and quiet Deep and quiet   Iris Round and reactive Round and reactive   Lens 1+ Nuclear sclerosis Posterior chamber intraocular lens   Vitreous Normal clear   Fundus Exam    Right Left   Disc Normal Normal   C/D Ratio 0.3 0.5   Macula Normal SR band, decreased FME   Vessels Normal attenuated   Periphery Normal inf 180 Retx;  attached       Neuro/Psych   Oriented x3: Yes   Mood/Affect: Normal                  No annotated images are attached to the encounter.         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   Poor reponse to topical gtts and IVT  Spontaneous improvement today  F/u 28m  Consider Avastin if worsens in future  Visual potential about 20/150

## 2011-03-27 NOTE — Assessment & Plan Note (Signed)
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   Poor reponse to topical gtts and IVT  Spontaneous improvement today  F/u 34m  Consider Avastin if worsens in future  Visual potential about 20/150

## 2011-05-18 ENCOUNTER — Ambulatory Visit: Payer: Self-pay | Admitting: Urology

## 2011-05-29 ENCOUNTER — Encounter: Payer: Self-pay | Admitting: Gastroenterology

## 2011-05-29 ENCOUNTER — Encounter: Payer: Self-pay | Admitting: Retina Ophthalmology

## 2011-05-29 NOTE — Progress Notes (Signed)
 This encounter was created in error - please disregard.

## 2011-06-17 ENCOUNTER — Other Ambulatory Visit: Payer: Self-pay

## 2011-06-17 MED ORDER — TOLTERODINE TARTRATE 4 MG PO CP24 *I*
ORAL_CAPSULE | ORAL | Status: DC
Start: 2011-06-17 — End: 2011-11-20

## 2011-06-17 NOTE — Telephone Encounter (Signed)
Pt needs new prescription for Detrol LA 4MG  (90 day supply) sent to [facility] ([place]/ph: [phone]). Pt cannot go through [facility] anymore. Pt can be reached at [phone].

## 2011-06-26 ENCOUNTER — Encounter: Payer: Self-pay | Admitting: Gastroenterology

## 2011-06-26 ENCOUNTER — Ambulatory Visit: Payer: Self-pay | Admitting: Retina Ophthalmology

## 2011-06-26 ENCOUNTER — Encounter: Payer: Self-pay | Admitting: Primary Care

## 2011-06-29 ENCOUNTER — Ambulatory Visit: Payer: Self-pay | Admitting: Urology

## 2011-06-29 ENCOUNTER — Encounter: Payer: Self-pay | Admitting: Urology

## 2011-06-29 VITALS — BP 126/81 | HR 83 | Ht 62.0 in | Wt 185.0 lb

## 2011-06-29 DIAGNOSIS — N3010 Interstitial cystitis (chronic) without hematuria: Secondary | ICD-10-CM

## 2011-06-29 DIAGNOSIS — R338 Other retention of urine: Secondary | ICD-10-CM

## 2011-06-29 DIAGNOSIS — N3281 Overactive bladder: Secondary | ICD-10-CM

## 2011-06-29 DIAGNOSIS — Z1389 Encounter for screening for other disorder: Secondary | ICD-10-CM

## 2011-06-29 DIAGNOSIS — N390 Urinary tract infection, site not specified: Secondary | ICD-10-CM

## 2011-06-29 LAB — POCT URINALYSIS DIPSTICK
Blood,UA POCT: NEGATIVE
Glucose,UA POCT: NORMAL
Ketones,UA POCT: NEGATIVE
Leuk Esterase,UA POCT: NEGATIVE
Lot #: 21421202
Nitrite,UA POCT: POSITIVE — AB
PH,UA POCT: 7 (ref 5–8)
Specific gravity,UA POCT: 1.015 (ref 1.002–1.03)

## 2011-06-29 LAB — POCT BLADDER SCAN PVR

## 2011-06-29 MED ORDER — NITROFURANTOIN MONOHYD MACRO 100 MG PO CAPS *I*
100.0000 mg | ORAL_CAPSULE | Freq: Two times a day (BID) | ORAL | Status: DC
Start: 2011-06-29 — End: 2012-02-02

## 2011-06-29 MED ORDER — TOLTERODINE TARTRATE 4 MG PO CP24 *I*
4.0000 mg | ORAL_CAPSULE | Freq: Every day | ORAL | Status: DC
Start: 2011-06-29 — End: 2011-11-20

## 2011-06-29 MED ORDER — TAMSULOSIN HCL 0.4 MG PO CAPS *I*
0.4000 mg | ORAL_CAPSULE | Freq: Every evening | ORAL | Status: DC
Start: 2011-06-29 — End: 2012-07-18

## 2011-06-29 MED ORDER — CEPHALEXIN 250 MG PO CAPS *I*
ORAL_CAPSULE | ORAL | Status: DC
Start: 2011-06-29 — End: 2012-02-02

## 2011-06-29 MED ORDER — CIPROFLOXACIN HCL 500 MG PO TABS *I*
500.0000 mg | ORAL_TABLET | Freq: Two times a day (BID) | ORAL | Status: DC
Start: 2011-06-29 — End: 2012-02-02

## 2011-06-29 NOTE — Progress Notes (Signed)
Visit Diagnosis(es) or CC:  UTIs, OAB Symptoms, IC    HPI:  Has had no UTIs since her last visit.  About one month ago she had typical symptoms, but she treated that on her own with macrobid.    Using antibiotic after intercourse only - keflex 250 mgs.  Uses Macrobid or Cipro for breakthrough infections.  In [place] most of the year.    Also on elmiron, tamsulosin, and detrol.  "I know all of these medications help.  If I miss any of them, I'm in trouble."  Voiding symptoms are "truly minimal.  This is working."    Some recent increased urgency and pelvic discomfort when on the treadmill.  Following an IC diet.     PMH/PSH:  No changes reported    FH:  No new cancer diagnoses  SH:  No changes reported    Review of Systems:    Systemic: Denies recent weight loss, weight gain, or fatigue.  Eyes: Denies change in vision.  ENT: Denies hearing problems, or loss. Denies swollen glands or stiff neck.  Denies sore throat.  Respiratory: Denies recent cold, flu or upper respiratory illness. Denies cough or dyspnea.  Denies SOB.  Heart: Denies recent "heart trouble," chest pain, or palpitations.  GI: Denies constipation, diarrhea, acid reflux, or bloody stool.  GU: Denies urogenital complaints, apart from as documented in HPI.  Denies hematuria.  Musculoskeletal: Denies any new muscle or joint pain, stiffness, or arthritis.  Skin: Denies rash, ulcers, or skin problems.  Neuro: Denies numbness or tingling of the extremities.  Denies dizziness or headaches.  Pysch: Denies depression, anxiety, or suicidal thoughts.  Endocrine: Denies diabetes, excessive thirst, or excessive urination.  Hematology: Denies recent bleeding, bruising, or anemia.  Allergy/Immunology: Denies runny nose, allergic rhinitis, or itching eyes.     Objective:    GENERAL:  No acute distress.  Well developed, well nourished.  NEUROLOGIC:  Oriented to person, place, time, and situation  PSYCHIATRIC:  Normal mood and affect  HEENT:  Normocephalic,  atraumatic.  Sclerae anicteric.  Nose/mouth without lesions.  NECK:  Trachea midline.  No JVD in sitting position.    SKIN:  Normal color, turgor, texture, hydration  LUNGS:  Respirations unlabored.    HEART:  Regular rate and rhythm    BACK/ORTHO:  No costovertebral angle tenderness.  No obvious back deformities.  No tenderness of the axial skeleton.    ABDOMEN:  Abdomen soft, nontender, nondistended, and without masses.  No evidence of bladder distention.  No inguinal hernias.     PVR:   106 ccs    Assessment:   UTIs    Clear currently   IC    Compensated satisfactorily   OAB    Compensated satisfactorily      Plan:   UTIs    Check urine culture    If negative, consider changing Detrol   IC    Continue above medications for now   OAB    Continue above medications for now    RXS.   For Detrol, Keflex, Macrobid, Cipro, Tamsulosin    Follow-Up:   6 months    LOS:      99213, [id number]    Time spent in counseling and coordination of care:

## 2011-06-30 ENCOUNTER — Encounter: Payer: Self-pay | Admitting: Urology

## 2011-06-30 DIAGNOSIS — N3010 Interstitial cystitis (chronic) without hematuria: Secondary | ICD-10-CM | POA: Insufficient documentation

## 2011-06-30 DIAGNOSIS — N3281 Overactive bladder: Secondary | ICD-10-CM | POA: Insufficient documentation

## 2011-06-30 LAB — AEROBIC CULTURE

## 2011-07-01 ENCOUNTER — Telehealth: Payer: Self-pay

## 2011-07-01 NOTE — Telephone Encounter (Signed)
I discussed the scripts that are at the pharmacy waiting for the patient.  All set.

## 2011-07-01 NOTE — Telephone Encounter (Signed)
Patient informed, script called in.

## 2011-07-01 NOTE — Telephone Encounter (Signed)
Please look at this result, it is positive.  Thanks, [employer]

## 2011-07-01 NOTE — Telephone Encounter (Signed)
Veronica Bishop-patient wants to know if her results are in from her urine sample? Patient can be reached at [phone]

## 2011-07-01 NOTE — Telephone Encounter (Signed)
Veronica Bishop with [facility]) wondering why another Ciprofloxacin prescription was called in. Veronica Bishop states there are multiple antibiotics still waiting for pt at Pharmacy and there are drug interactions. Please call to clarify: [phone]

## 2011-07-01 NOTE — Telephone Encounter (Signed)
If no allergic, Cipro 500 BID for 7 days.  Thanks,  Theodoro Grist

## 2011-08-07 ENCOUNTER — Ambulatory Visit: Payer: Self-pay | Admitting: Retina Ophthalmology

## 2011-08-14 ENCOUNTER — Encounter: Payer: Self-pay | Admitting: Retina Ophthalmology

## 2011-08-14 ENCOUNTER — Ambulatory Visit: Payer: Self-pay

## 2011-08-14 ENCOUNTER — Ambulatory Visit: Payer: Self-pay | Admitting: Retina Ophthalmology

## 2011-08-14 VITALS — BP 131/82 | HR 82 | Ht 62.0 in | Wt 185.0 lb

## 2011-08-14 DIAGNOSIS — H35352 Cystoid macular degeneration, left eye: Secondary | ICD-10-CM

## 2011-08-14 MED ORDER — BEVACIZUMAB 1.25 MG/0.05ML OPHTHALMIC INJ *I*
1.2500 mg | Freq: Once | INTRAMUSCULAR | Status: AC
Start: 2011-08-14 — End: 2011-08-14
  Administered 2011-08-14: 1.25 mg via INTRAVITREAL

## 2011-08-19 ENCOUNTER — Telehealth: Payer: Self-pay

## 2011-09-18 ENCOUNTER — Ambulatory Visit: Payer: Self-pay

## 2011-09-18 ENCOUNTER — Ambulatory Visit: Payer: Self-pay | Admitting: Retina Ophthalmology

## 2011-09-18 ENCOUNTER — Encounter: Payer: Self-pay | Admitting: Retina Ophthalmology

## 2011-09-18 VITALS — BP 130/77 | HR 80 | Ht 62.0 in | Wt 185.0 lb

## 2011-09-18 DIAGNOSIS — H35352 Cystoid macular degeneration, left eye: Secondary | ICD-10-CM

## 2011-10-02 ENCOUNTER — Telehealth: Payer: Self-pay | Admitting: Urology

## 2011-10-02 NOTE — Telephone Encounter (Signed)
Veronica Bishop,   It will probably be a week or more before I can get her authorization done. If she would like to pick up samples, we have placed one months worth under the front desk. Thanks!

## 2011-10-02 NOTE — Telephone Encounter (Signed)
Pt will pick up smaples

## 2011-10-02 NOTE — Telephone Encounter (Signed)
Detrol LA 4mg  pt needs prior auth, she is also going out on town and has no meds.  thanks

## 2011-10-08 ENCOUNTER — Other Ambulatory Visit: Payer: Self-pay

## 2011-10-08 NOTE — Telephone Encounter (Signed)
Husband came by office to pick up samples while they wait for Detrol LA to be approved, called [facility]. To speak with Kennyth Arnold because first messages was between [place] and everyone was in with patients, spoke to Dr. Silvestre Gunner and he said to give patient Toviaz 4mg  and let them know this medication isn't as long lasting but a good substitute while they're waiting for the Detrol to be approved. Samples were picked up

## 2011-10-16 ENCOUNTER — Telehealth: Payer: Self-pay | Admitting: Urology

## 2011-10-16 NOTE — Telephone Encounter (Signed)
Husband Riley Lam called to check status of Detrol Prior Authorization. Per the pharmacy there is no authorization to fill the script. Hanley has only 5 pills left of the samples she received. Please contact Ruthell Rummage at [phone].

## 2011-10-16 NOTE — Telephone Encounter (Signed)
Veronica Bishop,   This patient believed she needed prior authorization for her Detrol. When I called her insurance company, they said they do not cover Detrol, that she needs to use Vesicare or Gelnique. Is this appropriate?   Thanks,   Kennyth Arnold

## 2011-10-16 NOTE — Telephone Encounter (Signed)
Veronica Bishop,     I just spoke with this patient on the phone.  She was calling because she was wondering why she was given Tovias (if that is the correct name) when she is supposed to be getting Detrol.  I told her we did not carry Detrol samples in the office (she had Riley Lam pick the samples up from [place] when they were supposed to go to [facility], so we gave them Tovias as an alternative).  She is down to 5 pills and is wondering why she has not heard anything yet.  Looking at the note below, it seems her insurance will not cover the Detrol.  I told her that one of the providers will be looking into an alternative and getting back to her probably this evening or Monday.  I am not sure how you want to proceed if Detrol is not covered.  Please address.    Thanks,  [employer]

## 2011-10-16 NOTE — Telephone Encounter (Signed)
Veronica Bishop,    Please inform the patient about the requirements from her insurance company.  She could have samples of either.  Let her know of the options.  She could try Vesicare 5 mg samples, one pill once a day.  She also has the option of Gelnique.  Explain to her that this is a topical medicine the gets absorbed through the skin. Sometimes this has less of a chance of causing dry mouth and constipation. If she wants to try that she would apply 3 pumps (one at a time) to the skin each day.  She can pickup samples of either.  If they work that we would provide her with a prescription.  If she goes with the Vesicare week and also increase the dose in the future if that does not work.    Thanks.

## 2011-10-19 NOTE — Telephone Encounter (Signed)
Pt is going to continue Toviaz for now, she does have some detrol in case she needs it and the Bari Edward is not working. Pt will call and let us know how she is doing.

## 2011-10-19 NOTE — Telephone Encounter (Signed)
lmom for pt to call the office °

## 2011-11-20 ENCOUNTER — Telehealth: Payer: Self-pay | Admitting: Urology

## 2011-11-20 ENCOUNTER — Other Ambulatory Visit: Payer: Self-pay

## 2011-11-20 MED ORDER — FESOTERODINE FUMARATE 4 MG PO TB24 *I*
4.0000 mg | ORAL_TABLET | Freq: Every day | ORAL | Status: DC
Start: 2011-11-20 — End: 2012-08-22

## 2011-11-20 NOTE — Telephone Encounter (Signed)
Sent refill encounter to [facility]

## 2011-11-20 NOTE — Addendum Note (Signed)
Addended by: Frankey Shown on: 11/20/2011 02:48 PM     Modules accepted: Orders

## 2011-11-20 NOTE — Telephone Encounter (Signed)
Patient called requesting prescription for Toviaz 4 mg to be [facility], please send today.

## 2011-11-20 NOTE — Telephone Encounter (Signed)
Per pharmacist Gala Murdoch needs prior auth or step therapy.  The step Therapy is Vesicare, then Gelnique.  I have left a message for patient to call back

## 2011-11-20 NOTE — Telephone Encounter (Signed)
Dr. Ron Agee, [employer] insurance company wants her to step Therapy Vesicare, and Gelnique.  I have set aside Vesicare 5mg  for her to pick up at [facility] on Monday.  If this works for her she will call for a script (that should be covered...) FYI  Thanks, [employer]

## 2011-11-23 ENCOUNTER — Ambulatory Visit: Payer: Self-pay

## 2011-11-23 DIAGNOSIS — N390 Urinary tract infection, site not specified: Secondary | ICD-10-CM

## 2011-11-25 ENCOUNTER — Telehealth: Payer: Self-pay | Admitting: Urology

## 2011-11-25 ENCOUNTER — Telehealth: Payer: Self-pay

## 2011-11-25 NOTE — Telephone Encounter (Signed)
Cancelled this prior authorization due to this patient trying Vesicare

## 2011-11-25 NOTE — Telephone Encounter (Signed)
Veronica Bishop called for her urine results, they are in the preliminary status.   She is going to start some Macrobid she has on hand as she is in [place] right now.  She would like me to call her husband Veronica Bishop tomorrow with the final result and he will bring the medication to her when he meets her in [place] on Saturday.

## 2011-11-25 NOTE — Telephone Encounter (Signed)
See previous encounter. Pharmacy called regarding the prior auth for toviaz, please call with the status of this. [phone]

## 2011-11-26 ENCOUNTER — Other Ambulatory Visit: Payer: Self-pay

## 2011-11-26 LAB — AEROBIC CULTURE

## 2011-11-26 MED ORDER — NITROFURANTOIN MONOHYD MACRO 100 MG PO CAPS *I*
100.0000 mg | ORAL_CAPSULE | Freq: Two times a day (BID) | ORAL | Status: DC
Start: 2011-11-26 — End: 2012-02-02

## 2011-11-26 NOTE — Telephone Encounter (Signed)
Please review her positive urine culture results and advise. [facility]

## 2011-11-26 NOTE — Telephone Encounter (Signed)
Continue MACROBID 100 mg po bid for 7 days total.  See if she has enough for 7 days. If not, I can fax in the remainder to her pharmacy.    Thanks.

## 2011-11-26 NOTE — Telephone Encounter (Signed)
I sent script to Theodoro Grist to Sign.  Will call patient's husband in am.

## 2011-11-27 NOTE — Telephone Encounter (Signed)
Script sent to pharmacy. Doug informed

## 2011-12-29 ENCOUNTER — Telehealth: Payer: Self-pay | Admitting: Urology

## 2011-12-29 NOTE — Telephone Encounter (Signed)
Patient is complaining that Vesicare that was previously prescribed is not working and would like to receive something else to try. Patient requests samples to try before being prescribed a new medication due to insurance changes. Please call  .

## 2011-12-29 NOTE — Telephone Encounter (Signed)
LMOM for patient to call the office. TB/RN

## 2011-12-31 NOTE — Telephone Encounter (Signed)
Spoke with pt she was taking Vesicare 5 mg, with no results. Pt will increase this to Vesicare 10 mg and see how she does, as she has to do the step therapy in order to cover the Detrol. Rosanne made aware and will leave samples at the front desk for pt to pick up. Pt will call the office with any further questions or concerns.

## 2012-01-11 ENCOUNTER — Ambulatory Visit: Payer: Self-pay | Admitting: Urology

## 2012-01-26 ENCOUNTER — Telehealth: Payer: Self-pay | Admitting: Urology

## 2012-01-26 NOTE — Telephone Encounter (Signed)
Per the step therapy, patient will try the Gelnique next.  I have set samples of  Gelnique and Toviaz 4mg  aside for pick up

## 2012-01-26 NOTE — Telephone Encounter (Signed)
Patient is requesting samples of Toviaz . Insurance company will not cover the Veronica Bishop and [name] is not working. Patients insurance company needs her to try different medications before they will approve the Toviaz. Other medication samples might be needed as well.    Please call to advise patient. Patient would like to know if her husband could stop by later this week to pick up the samples.

## 2012-02-02 ENCOUNTER — Ambulatory Visit: Payer: Self-pay

## 2012-02-02 ENCOUNTER — Telehealth: Payer: Self-pay

## 2012-02-02 DIAGNOSIS — N390 Urinary tract infection, site not specified: Secondary | ICD-10-CM

## 2012-02-02 NOTE — Telephone Encounter (Signed)
Patient would like a call with her urine culture results on Thursday.  She is out of town and has Macrobid and Cipro on hand

## 2012-02-02 NOTE — Progress Notes (Signed)
Patient dropped urine off for dip and send.  Urine dips trace leuks and positive nitrites.  She is symptomatic and would like to have Macrobid called into her pharmacy as she is leaving town for 3 weeks.  Ok's per Dr. Lorel Monaco

## 2012-02-03 LAB — AEROBIC CULTURE: Aerobic Culture: 0

## 2012-02-04 NOTE — Telephone Encounter (Signed)
Left message telling patient urine culture did not grow, it is negative

## 2012-02-24 ENCOUNTER — Ambulatory Visit: Payer: Self-pay | Admitting: Urology

## 2012-02-24 ENCOUNTER — Encounter: Payer: Self-pay | Admitting: Urology

## 2012-02-24 VITALS — BP 147/83 | HR 78 | Ht 62.0 in | Wt 186.0 lb

## 2012-02-24 LAB — POCT URINALYSIS DIPSTICK
Blood,UA POCT: NEGATIVE
Glucose,UA POCT: NORMAL
Ketones,UA POCT: NEGATIVE
Leuk Esterase,UA POCT: 1 — AB
Lot #: 21927302
Nitrite,UA POCT: NEGATIVE
PH,UA POCT: 6 (ref 5–8)
Protein,UA POCT: NEGATIVE mg/dL
Specific gravity,UA POCT: 1.015 (ref 1.002–1.03)

## 2012-02-24 MED ORDER — CIPROFLOXACIN HCL 500 MG PO TABS *I*
500.0000 mg | ORAL_TABLET | Freq: Two times a day (BID) | ORAL | Status: DC
Start: 2012-02-24 — End: 2012-08-22

## 2012-02-24 MED ORDER — NITROFURANTOIN MONOHYD MACRO 100 MG PO CAPS *I*
100.0000 mg | ORAL_CAPSULE | Freq: Two times a day (BID) | ORAL | Status: DC
Start: 2012-02-24 — End: 2012-08-22

## 2012-02-24 MED ORDER — CEPHALEXIN 250 MG PO CAPS *I*
ORAL_CAPSULE | ORAL | Status: DC
Start: 2012-02-24 — End: 2012-08-22

## 2012-02-24 NOTE — Progress Notes (Signed)
Visit Diagnosis(es) or CC: UTIs, OAB, IC    HPI:  The patient comes to the office today in follow up for the diagnoses listed above in Visit Diagnosis(es).     UTIs  She has had no infections since her last visit.  She is on no regular antibiotic prophylaxis, although she is using antibiotics after intercourse (Keflex).  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  She goes to [place] often, so she likes to have the medication handy.   OAB/IC  Generally doing very well.  She is on flomax once daily only at this point, which she finds very helpful.  She has been off elmiron.  She feels, however, that she is having increased bladder discomfort.  Many anti-cholinergics and anti-muscarinics have been unsuccessful or have been poorly tolerated.  Gala Murdoch did work fairly well, but Training and development officer was an issue.  Tricyclics have had some favorable impact, but tolerance has been limited.      PMH/PSH:   Root Canal this morning    FH:     No new cancer diagnoses      SH:    No changes reported    Review of Systems:    Systemic: Denies recent weight loss, weight gain, or fatigue.  Eyes: Denies change in vision.  Ears: Denies change in hearing.    Neck: Denies stiff neck, pain with head rotation  Respiratory: Denies recent cold, flu or upper respiratory illness. Denies cough or dyspnea.  Denies SOB.  Heart: Denies recent "heart trouble," chest pain, or palpitations.  GI: Denies change in bowel habits (New onset constipation or diarrhea).  Denies new or worsening acid reflux.  Denies very dark or bloody stool.  GU: Denies urogenital complaints, apart from as documented in HPI.    Musculoskeletal: Denies any new muscle or joint pain, stiffness, or arthritis.  Skin: Denies new rash or other skin problems.  Neuro: Denies numbness or tingling of the extremities.  Denies dizziness or headaches.     Objective:    GENERAL:  No acute distress.  Well developed, well  nourished.  NEUROLOGIC:  Oriented to person, place, time, and situation  PSYCHIATRIC:  Normal mood and affect  HEENT:  Normocephalic, atraumatic.  Sclerae anicteric.  Nose/mouth without lesions.  NECK:  Trachea midline.  No JVD in sitting position.    SKIN:  Normal color, turgor, texture, hydration  LUNGS:  Respirations unlabored.    HEART:  Regular rate and rhythm    BACK/ORTHO:  No CVA tenderness.  No obvious back deformities.  No tenderness of the axial skeleton.    ABDOMEN:  Soft, nontender, nondistended, and without masses.  No evidence of bladder distention.  No inguinal hernias.  EXTREMITIES:  No evidence of cyanosis or clubbing.  Edema not evident. Calves non-tender    PVR:   100 ccs      ASSESSMENT:   UTIs    Well controlled   OAB/IC    Inadequately compensated at present  PLAN:   Check urine culture   Continue Keflex 250 mgs after intercourse   New prescriptions for Keflex along with Cipro and Macrobid   Trial of Myrbetriq 25    I reviewed the mechanism of action, drug interactions, and common side effects of this medication.    FOLLOW-UP:   By phone in one month    LOS:      99213, [id number]    Time spent  in counseling and coordination of care (if used for LOS):

## 2012-02-25 ENCOUNTER — Encounter: Payer: Self-pay | Admitting: Urology

## 2012-02-25 DIAGNOSIS — N3010 Interstitial cystitis (chronic) without hematuria: Secondary | ICD-10-CM | POA: Insufficient documentation

## 2012-02-25 LAB — AEROBIC CULTURE

## 2012-03-24 ENCOUNTER — Telehealth: Payer: Self-pay | Admitting: Urology

## 2012-03-24 ENCOUNTER — Other Ambulatory Visit: Payer: Self-pay

## 2012-03-24 MED ORDER — MIRABEGRON ER 25 MG PO TB24 *I*
25.0000 mg | ORAL_TABLET | Freq: Every day | ORAL | Status: DC
Start: 2012-03-24 — End: 2012-07-11

## 2012-03-24 NOTE — Telephone Encounter (Signed)
Refill encounter sent to [facility]

## 2012-03-24 NOTE — Telephone Encounter (Signed)
Patient states Dr. Ron Agee gave her samples of Myrbetrig extended release 25 mg tablets to take once a day. Patient states this medication is working well and would like [facility]. Please call once it has been sent if possible.

## 2012-03-25 ENCOUNTER — Telehealth: Payer: Self-pay | Admitting: Urology

## 2012-03-25 NOTE — Telephone Encounter (Signed)
Patient would like to pick up a sample of Mybetrig extended release 25 mg tablet today. She will leaving on a plane on Monday and needs this. In the mean time can you also please do a Prior Authorization and call [phone] and then they will fax a form to be filled out. Please fax letter of Neccesity, that this medication is working and fax it back. Patient states Veronica Bishop knows about this.Then [facility]   [phone]

## 2012-03-25 NOTE — Telephone Encounter (Signed)
Erroneous encounter-disregard

## 2012-04-22 ENCOUNTER — Ambulatory Visit: Payer: Self-pay | Admitting: Retina Ophthalmology

## 2012-04-26 ENCOUNTER — Encounter: Payer: Self-pay | Admitting: Gastroenterology

## 2012-04-26 LAB — HM MAMMOGRAPHY

## 2012-04-29 ENCOUNTER — Encounter: Payer: Self-pay | Admitting: Primary Care

## 2012-05-20 ENCOUNTER — Ambulatory Visit: Payer: Self-pay | Admitting: Retina Ophthalmology

## 2012-05-20 ENCOUNTER — Encounter: Payer: Self-pay | Admitting: Retina Ophthalmology

## 2012-05-20 DIAGNOSIS — H35352 Cystoid macular degeneration, left eye: Secondary | ICD-10-CM

## 2012-05-20 NOTE — Assessment & Plan Note (Signed)
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   Spontaneous improvement from June to August 2012  Now with severe increase in ME and drop in vision  Poor reponse to topical gtts and IVT  Poor response to Avastin  X 1 OS (08/13/12)  rec observation from now on  F/u 6 month OCT and for re-evaluation.  if worsens in future  Visual potential about 20/150    Hx RD repair OS  RD repair X 3 (3/11)  See above    PCIOL OS  -PCO OS, follow for now    Lattice OD  - no tears, SSx of RD explained and understood

## 2012-05-20 NOTE — Progress Notes (Signed)
Subjective:   Subjective9/27/2013   Chief Complaint   Patient presents with   . 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   --S/P Avastin X 1 OS (08/13/12)  --RD repair X 3 (3/11)  --PCIOL OS, PCO OS      Pt states vision is the same. No changes since last visit.        Current Outpatient Rx   Name  Route  Sig  Dispense  Refill   . mirabegron (MYRBETRIQ) 25 MG 24 hr tablet    Oral    Take 1 tablet (25 mg total) by mouth daily    30 tablet    5     . nitrofurantoin monohydrate macrocrystal (MACROBID) 100 MG capsule    Oral    Take 1 capsule (100 mg total) by mouth 2 times daily    14 capsule    6     . tamsulosin (FLOMAX) 0.4 MG    Oral    Take 1 capsule (0.4 mg total) by mouth every evening      90 capsule    3     . tamsulosin (FLOMAX) 0.4 MG    Oral    TAKE 1 CAPSULE DAILY    90    4       Created by Conversion     . epinephrine (EPIPEN) 0.3 MG/0.3ML DEVI    Injection    INJECT 0.3ML INTRAMUSCULARLY AS DIRECTED    2    5       Created by Conversion     . cephalexin (KEFLEX) 250 MG capsule        One daily as directed    30 capsule    12     . ciprofloxacin (CIPRO) 500 MG tablet    Oral    Take 1 tablet (500 mg total) by mouth 2 times daily    14 tablet    6     . fesoterodine (TOVIAZ) 4 MG 24 hr tablet    Oral    Take 1 tablet (4 mg total) by mouth daily    90 tablet    3     . pentosan polysulfate (ELMIRON) 100 MG capsule    Oral    TAKE 1 CAPSULE 3 TIMES DAILY.    270    4       Created by Conversion        Sulfa drugs and No known latex allergy   No birth history on file.  Past Medical History   Diagnosis Date   . Macular edema      PERSISITENT PSEUDOPHAKIC OS   . Cataract    . Refractive error 03/27/2011      Past Surgical History   Procedure Laterality Date   . Hx tonsillectomy/adenoidectomy       Tonsillectomy Conversion Data    . Cesarean section, classic       Cesarean Section Conversion Data    .  Bunionectomy       Hallux Valgus (Bunion) Correction Conversion Data    . Cataract removal  10/24/09     PC IOL OS   . Retinal detachment surgery       X 3   . 25g ppv/ivt/afx/el/22%sf6 os  05/16/09   . Sb/20g ppv/mo/pfc/rtx/afx/15% c3 f8  os  06/20/09   . Barrier lpc os  07/30/09   . Phaco/iol/25g ppv/pcox/mp/afx/icg/ilmx/14% c3 f8 os  10/24/09   . Intra vitreal triesence os  05/30/10     4MG    . Avastin #1 os 08/14/11 consent 08/14/11 maxitrol        History   Smoking status   . Former Smoker   . Types: Cigarettes   . Quit date: 02/15/1981   Smokeless tobacco   . Not on file      History   Alcohol Use   . Yes      History   Drug Use No      Specialty Problems       Ophthalmology Problems    Cataract        Cystoid macular edema of left eye        Refractive error               ROS    Positive for: Eyes    Negative for: Constitutional, Gastrointestinal, Neurological, Skin,   Genitourinary, Musculoskeletal, HENT, Endocrine, Cardiovascular,   Respiratory, Psychiatric, Allergic/Imm, Heme/Lymph       Objective:   ObjectiveThere were no vitals filed for this visit.    Base Eye Exam       Visual Acuity    Right Left   Dist cc 20/20 20/400   Method: Snellen - Linear    Comments: OS- letter E flashes in and out.   Tonometry    Right Left   Pressure 20 14   Method: Tonopen   Time: 1:30 PM   Neuro/Psych   Oriented x3: Yes   Mood/Affect: Normal   Dilation   Both eyes: 2.5% Phenylephrine, 1.0% Tropicamide @ 1:30 PM      Slit Lamp and Fundus Exam       External Exam    Right Left   External Normal Normal   Slit Lamp Exam    Right Left   Lids/Lashes Normal Normal   Conjunctiva/Sclera White and quiet White and quiet   Cornea Clear Clear   Anterior Chamber Deep and quiet Deep and quiet   Iris Round and reactive Round and reactive   Lens 1+ Nuclear sclerosis Posterior chamber intraocular lens; 3+ PCO   Vitreous Normal clear   Fundus Exam    Right Left   Disc Normal Normal   C/D Ratio 0.3 0.5   Macula Normal SR band, stable FME   Vessels  Normal attenuated   Periphery Lattice ST and IT, no tears inf 180 Retx; attached                  No annotated images are attached to the encounter.         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   Spontaneous improvement from June to August 2012  Now with severe increase in ME and drop in vision  Poor reponse to topical gtts and IVT  Poor response to Avastin  X 1 OS (08/13/12)  rec observation from now on  F/u 6 month OCT and for re-evaluation.  if worsens in future  Visual potential about 20/150    Hx RD repair OS  RD repair X 3 (3/11)  See above    PCIOL OS  -PCO OS, follow for now    Lattice OD  - no tears, SSx of RD explained and understood

## 2012-07-11 ENCOUNTER — Other Ambulatory Visit: Payer: Self-pay

## 2012-07-11 MED ORDER — MIRABEGRON ER 25 MG PO TB24 *I*
25.0000 mg | ORAL_TABLET | Freq: Every day | ORAL | Status: DC
Start: 2012-07-11 — End: 2013-11-06

## 2012-07-18 ENCOUNTER — Other Ambulatory Visit: Payer: Self-pay | Admitting: Urology

## 2012-08-22 ENCOUNTER — Encounter: Payer: Self-pay | Admitting: Urology

## 2012-08-22 ENCOUNTER — Ambulatory Visit: Payer: Self-pay | Admitting: Urology

## 2012-08-22 VITALS — BP 140/92 | Ht 62.0 in | Wt 195.0 lb

## 2012-08-22 DIAGNOSIS — Z1389 Encounter for screening for other disorder: Secondary | ICD-10-CM

## 2012-08-22 DIAGNOSIS — N390 Urinary tract infection, site not specified: Secondary | ICD-10-CM

## 2012-08-22 DIAGNOSIS — R339 Retention of urine, unspecified: Secondary | ICD-10-CM | POA: Insufficient documentation

## 2012-08-22 DIAGNOSIS — N3281 Overactive bladder: Secondary | ICD-10-CM

## 2012-08-22 DIAGNOSIS — N3010 Interstitial cystitis (chronic) without hematuria: Secondary | ICD-10-CM

## 2012-08-22 LAB — POCT URINALYSIS DIPSTICK
Blood,UA POCT: NEGATIVE
Glucose,UA POCT: NORMAL
Ketones,UA POCT: NEGATIVE
Leuk Esterase,UA POCT: NEGATIVE
Lot #: 22078502
Nitrite,UA POCT: NEGATIVE
PH,UA POCT: 5 (ref 5–8)
Protein,UA POCT: NEGATIVE mg/dL
Specific gravity,UA POCT: 1.015 (ref 1.002–1.03)

## 2012-08-22 NOTE — Progress Notes (Signed)
Visit Diagnosis(es) or CC: UTIs, OAB, IC    HPI:  The patient comes to the office today in follow up for the above diagnosis (-es).   OAB/IC  "I'm doing great on that Myrbetriq."  "I'm so much better."  "I have no pain."  "I have can have sex without pain."  She is also on tamsulosin once daily.   UTIs  "I have had no infections."  She is on no antibiotic prophylaxis.  She likes having the Cipro and Macrobid on hand, however.    PMH:    No changes reported    PSH:  No changes reported    FH:   No new cancer diagnoses      SH:  No changes reported    Review of Systems:    Systemic: Denies recent weight loss, weight gain, fatigue, fever, chills.  Neuro: Denies headache, dizziness.  Heart:: Denies chest pain or palpitations.  Respiratory: Denies shortness of breath.  G.I.: Denies change in bowel habits, blood in stool, reflux.  GU: Denies urogenital complaints, apart from what is documented in the HPI.  Musculoskeletal: Denies new muscle or joint pain.  Right foot heal spur.    Eyes: Denies change in vision.  Ears: Denies change in hearing.    Neck: Denies stiff neck or neck pain.  Skin: Denies rash.  Psych: Denies depression.     Objective:    GENERAL:  No acute distress.  Well developed, well nourished.  NEUROLOGIC:  Oriented to person, place, time, and situation  PSYCHIATRIC:  Normal mood and affect  HEENT:  NC/AT.  Sclerae anicteric.  Nose/mouth/ears without obvious lesions.  NECK:  Trachea midline.  No JVD in sitting position.    SKIN:  Normal color, turgor, texture, hydration  LUNGS:  Respirations unlabored.    HEART:  Regular rate and rhythm    BACK/ORTHO:  No CVA tenderness.  No obvious back deformities.  No tenderness of the axial skeleton.    ABDOMEN:  Soft, nontender, nondistended, and without masses.  No evidence of bladder distention.  No inguinal hernias.         PVR:   102 ccs      ASSESSMENT:   UTIs    There is no clinical evidence of disease at this time.    OAB/IC    Well controlled with  Myrbetriq   Elevated PVR    Elevated but stable  PLAN:   Continue myrbetriq   Check urine culture      FOLLOW-UP:   One year rather than six months    LOS:      99213, 16109    Time spent in counseling and coordination of care (if used for LOS):

## 2012-08-23 LAB — AEROBIC CULTURE

## 2012-11-18 ENCOUNTER — Ambulatory Visit: Payer: Self-pay | Admitting: Retina Ophthalmology

## 2012-11-18 ENCOUNTER — Encounter: Payer: Self-pay | Admitting: Retina Ophthalmology

## 2012-11-18 DIAGNOSIS — H35352 Cystoid macular degeneration, left eye: Secondary | ICD-10-CM

## 2012-11-18 NOTE — Assessment & Plan Note (Signed)
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   Spontaneous improvement from June to August 2012  Now with severe increase in ME and drop in vision  Poor reponse to topical gtts and IVT  Poor response to Avastin  X 1 OS (08/13/12)  rec observation from now on  F/u 12 month OCT and for re-evaluation.  if worsens in future  Visual potential about 20/150    Hx RD repair OS  RD repair X 3 (3/11)  See above    PCIOL OS  -PCO OS, follow for now    Lattice OD  - no tears, SSx of RD explained and understood

## 2012-11-18 NOTE — Progress Notes (Signed)
Subjective:   Subjective3/28/2014   Chief Complaint   Patient presents with   . 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   --S/P Avastin X 1 OS (08/13/12)   --RD repair X 3 (3/11)   --PCIOL OS, PCO OS   Lattice OD    Pt states vision is the same as last visit. Flashes in OS once in a while.           Current Outpatient Rx   Name  Route  Sig  Dispense  Refill   . tamsulosin (FLOMAX) 0.4 MG        TAKE 1 CAPSULE (0.4 MG TOTAL) BY MOUTH EVERY EVENING    90 capsule    2     . mirabegron (MYRBETRIQ) 25 MG 24 hr tablet    Oral    Take 1 tablet (25 mg total) by mouth daily    30 tablet    5     . epinephrine (EPIPEN) 0.3 MG/0.3ML DEVI    Injection    INJECT 0.3ML INTRAMUSCULARLY AS DIRECTED    2    5       Created by Conversion        Sulfa drugs and No known latex allergy   No birth history on file.  Past Medical History   Diagnosis Date   . Macular edema      PERSISITENT PSEUDOPHAKIC OS   . Cataract    . Refractive error 03/27/2011      Past Surgical History   Procedure Laterality Date   . Hx tonsillectomy/adenoidectomy       Tonsillectomy Conversion Data    . Cesarean section, classic       Cesarean Section Conversion Data    . Bunionectomy       Hallux Valgus (Bunion) Correction Conversion Data    . Cataract removal  10/24/09     PC IOL OS   . Retinal detachment surgery       X 3   . 25g ppv/ivt/afx/el/22%sf6 os  05/16/09   . Sb/20g ppv/mo/pfc/rtx/afx/15% c3 f8 os  06/20/09   . Barrier lpc os  07/30/09   . Phaco/iol/25g ppv/pcox/mp/afx/icg/ilmx/14% c3 f8 os  10/24/09   . Intra vitreal triesence os  05/30/10     4MG    . Avastin #1 os 08/14/11 consent 08/14/11 maxitrol        History   Smoking status   . Former Smoker   . Types: Cigarettes   . Quit date: 02/15/1981   Smokeless tobacco   . Not on file      History   Alcohol Use   . Yes      History   Drug Use No      Specialty Problems      ICD-9-CM       Ophthalmology  Problems    Cataract        Cystoid macular edema of left eye        Refractive error               ROS    Positive for: Eyes    Negative for: Constitutional, Gastrointestinal, Neurological, Skin,   Genitourinary, Musculoskeletal, HENT, Endocrine, Cardiovascular,   Respiratory, Psychiatric, Allergic/Imm, Heme/Lymph       Objective:   ObjectiveThere were no vitals  filed for this visit.    Base Eye Exam       Visual Acuity    Right Left   Dist cc 20/20 20/400   Method: Snellen - Linear   Correction: Glasses    Comments: OS could make out the letter E a little bit.   Tonometry    Right Left   Pressure 16 15   Method: Tonopen   Time: 3:24 PM   Pupils    React APD   Right  None   Left Minimal None   Neuro/Psych   Oriented x3: Yes   Mood/Affect: Normal   Dilation   Both eyes: 2.5% Phenylephrine, 1.0% Tropicamide @ 3:24 PM      Slit Lamp and Fundus Exam       External Exam    Right Left   External Normal Normal   Slit Lamp Exam    Right Left   Lids/Lashes Normal Normal   Conjunctiva/Sclera White and quiet White and quiet   Cornea Clear Clear   Anterior Chamber Deep and quiet Deep and quiet   Iris Round and reactive Round and reactive   Lens 1+ Nuclear sclerosis Posterior chamber intraocular lens; 3+ PCO   Vitreous Normal clear   Fundus Exam    Right Left   Disc Normal Normal   C/D Ratio 0.3 0.5   Macula Normal SR band, increased FME   Vessels Normal attenuated   Periphery Lattice ST and IT, no tears inf 180 Retx; attached      Refraction       Wearing Rx    Sphere Cylinder Axis Add   Right -0.75 +0.75 142 +2.50   Left +2.50  180 +2.00                  No annotated images are attached to the encounter.         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   Spontaneous improvement from June to August 2012  Now with severe increase in ME and drop in vision  Poor reponse to topical gtts and IVT  Poor response to Avastin  X 1 OS (08/13/12)  rec  observation from now on  F/u 12 month OCT and for re-evaluation.  if worsens in future  Visual potential about 20/150    Hx RD repair OS  RD repair X 3 (3/11)  See above    PCIOL OS  -PCO OS, follow for now    Lattice OD  - no tears, SSx of RD explained and understood

## 2012-12-01 ENCOUNTER — Encounter: Payer: Self-pay | Admitting: Primary Care

## 2012-12-02 ENCOUNTER — Ambulatory Visit: Payer: Self-pay | Admitting: Primary Care

## 2012-12-02 ENCOUNTER — Encounter: Payer: Self-pay | Admitting: Primary Care

## 2012-12-02 VITALS — BP 128/76 | HR 89 | Resp 16 | Ht 62.0 in | Wt 192.6 lb

## 2012-12-02 DIAGNOSIS — Z139 Encounter for screening, unspecified: Secondary | ICD-10-CM

## 2012-12-02 DIAGNOSIS — Z8249 Family history of ischemic heart disease and other diseases of the circulatory system: Secondary | ICD-10-CM

## 2012-12-02 DIAGNOSIS — L821 Other seborrheic keratosis: Secondary | ICD-10-CM

## 2012-12-02 LAB — HEPATIC FUNCTION PANEL
ALT: 21 U/L (ref 0–35)
AST: 23 U/L (ref 0–35)
Albumin: 4.4 g/dL (ref 3.5–5.2)
Alk Phos: 82 U/L (ref 35–105)
Bilirubin,Direct: <0.2 mg/dL (ref 0.0–0.3)
Bilirubin,Total: 0.4 mg/dL (ref 0.0–1.2)
Total Protein: 6.8 g/dL (ref 6.3–7.7)

## 2012-12-02 LAB — LIPID PANEL
Chol/HDL Ratio: 3.5
Cholesterol: 227 mg/dL — AB
HDL: 64 mg/dL
LDL Calculated: 135 mg/dL
Non HDL Cholesterol: 163 mg/dL
Triglycerides: 138 mg/dL

## 2012-12-02 LAB — BASIC METABOLIC PANEL
Anion Gap: 5 — ABNORMAL LOW (ref 7–16)
CO2: 32 mmol/L — ABNORMAL HIGH (ref 20–28)
Calcium: 9.9 mg/dL (ref 8.6–10.2)
Chloride: 103 mmol/L (ref 96–108)
Creatinine: 0.78 mg/dL (ref 0.51–0.95)
GFR,Black: 99 *
GFR,Caucasian: 85 *
Glucose: 88 mg/dL (ref 60–99)
Lab: 10 mg/dL (ref 6–20)
Potassium: 4.6 mmol/L (ref 3.3–5.1)
Sodium: 140 mmol/L (ref 133–145)

## 2012-12-02 NOTE — Progress Notes (Signed)
Concern about HTN  Veronica Bishop is a 57 y.o. year who has no headache or palpitations.    She has no chest pains or sob.    She does check her blood pressure and  does watch her salt.    Her blood pressure was up so she started to swim daily and it came down.  She has a wrist cuff and it is high on the wrist cuff.      Veronica Bishop has no family members with dialysis or kidney transplant.  She has no diabetes,  kidney stones, use of daily NSAID.  Her parents died of stroke.    SKIN  She has has a skin mark that her friend told her should be checked.    Patients meds and allergies are reviewed today and there are no changes in family history.  See electronic record for details.    BP 128/76  Pulse 89  Resp 16  Ht 1.575 m (5\' 2" )  Wt 87.363 kg (192 lb 9.6 oz)  BMI 35.22 kg/m2  SpO2 96% Body mass index is 35.22 kg/(m^2).    GENERAL APPEARANCE: appears stated age, well appearing, no apparent distress.  EYES: conjunctiva benign, anicteric  MOUTH: wet, no exudate, oropharynx clear.  NECK: supple, no carotid bruits, no unusual lymphadenopathy, no thyromegaly or mass  LUNGS: Clear to auscultation, no wheezing or crackles. No retractions.  HEART: regular rhythm and rate with no murmurs, JVP flat  EXTREMITIES: Without clubbing, cyanosis, or edema  NEUROLOGIC: Alert and oriented times three, cranial nerves II through XII intact by observation, motor/sensory exam appear normal, normal gait.  SKIN:  no rash; 2cm x 2 cm multicolored dry, raised plaque on left wrist  PSYCH: cheerful, animated, well groomed, good eye contact.    DISCUSSION 57 y.o. year old female with HTN when checked with home wrist cuff    HTN   BP: 128/76 mmHg   --According to JNC 7 guidelines target BP:   less than 130/80  --patient currently is at goal.   Plan to reach goal includes:   Lifestyle modifications not needed   exercise plan   --Medication management: no changes made   --Follow up in 6 months     SKIN PLAQUE - appears to seb keratosis but  patient is concerned about melanoma.  Referred to derm    HCM  Immunization History   Administered Date(s) Administered   . Tdap 09/10/2010     Health Maintenance   Topic Date Due   . Hepatitis C Screening Offered  04/10/2005   . Colon Cancer Screening 10 Year Colonoscopy  08/26/2013   . Imm-influenza 9 Y + Up  04/24/2013   . Breast Cancer Screening  04/26/2013   . Hiv Testing Offered  Addressed       NEEDS Hep C check, pap    Follow up one year .    Greater than 50% of 25 minutes spent in discussion of pathophysiology and management of HTN

## 2012-12-03 LAB — HEMOGLOBIN A1C: Hemoglobin A1C: 5.4 % (ref 4.0–6.0)

## 2012-12-05 LAB — VITAMIN D
25-OH VIT D2: <4 ng/mL
25-OH VIT D3: 30 ng/mL
25-OH Vit Total: 30 ng/mL (ref 30–60)

## 2012-12-08 ENCOUNTER — Encounter: Payer: Self-pay | Admitting: Primary Care

## 2012-12-15 ENCOUNTER — Encounter: Payer: Self-pay | Admitting: Primary Care

## 2012-12-16 ENCOUNTER — Encounter: Payer: Self-pay | Admitting: Primary Care

## 2012-12-16 ENCOUNTER — Ambulatory Visit: Payer: Self-pay | Admitting: Primary Care

## 2012-12-16 ENCOUNTER — Telehealth: Payer: Self-pay

## 2012-12-16 VITALS — BP 128/76 | HR 75 | Temp 97.1°F | Resp 16 | Ht 62.0 in | Wt 195.0 lb

## 2012-12-16 DIAGNOSIS — Z0000 Encounter for general adult medical examination without abnormal findings: Secondary | ICD-10-CM

## 2012-12-16 DIAGNOSIS — IMO0001 Reserved for inherently not codable concepts without codable children: Secondary | ICD-10-CM

## 2012-12-16 DIAGNOSIS — M722 Plantar fascial fibromatosis: Secondary | ICD-10-CM

## 2012-12-16 DIAGNOSIS — Z139 Encounter for screening, unspecified: Secondary | ICD-10-CM

## 2012-12-16 MED ORDER — EPINEPHRINE 0.3 MG/0.3ML IJ SOAJ *I*
INTRAMUSCULAR | Status: DC
Start: 2012-12-16 — End: 2020-07-05

## 2012-12-16 MED ORDER — CLINDAMYCIN HCL 300 MG PO CAPS *I*
300.0000 mg | ORAL_CAPSULE | Freq: Three times a day (TID) | ORAL | Status: DC
Start: 2012-12-16 — End: 2013-01-02
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
## 2012-12-30 ENCOUNTER — Ambulatory Visit: Payer: Self-pay | Admitting: Dermatology

## 2012-12-30 ENCOUNTER — Encounter: Payer: Self-pay | Admitting: Dermatology

## 2012-12-30 VITALS — BP 128/78 | Ht 62.0 in | Wt 192.0 lb

## 2012-12-30 DIAGNOSIS — D489 Neoplasm of uncertain behavior, unspecified: Secondary | ICD-10-CM

## 2012-12-30 NOTE — Patient Instructions (Signed)
Postoperative Care of the Biopsy site:    • Keep the original dressing on and dry for 24-48 hours.  • Remove the dressing and wash with mild soap and water.  • Apply a thin layer of Vaseline/Aquaphor ointment (If you have steri-strips over the site do not use any ointment.)  • Cover the wound with a Band-aid or a piece of Telfa held in place with a Band-Aid or tape.  • The wound should be cleaned and the dressing changed once a day for one to two weeks.      Plain Tylenol or Extra Strength Tylenol should relieve any pain you may have as a result of the biopsy.      If you experience bleeding, apply firm pressure with gauze over the area for 15 minutes.    For emergencies after hours or on the weekend call the [facility] office at [phone] and a provider will contact you.

## 2012-12-30 NOTE — Progress Notes (Deleted)
Consulting MD: Karen Kays, MD    Chief Complaint   Patient presents with   . Skin Exam     spot L arm         HPI: Pt is a 57 y.o. female who presents for evaluation and treatment of     ROS: Pt is otherwise in normal state of health. No other skin concerns.      Allergies:  Allergies   Allergen Reactions   . Sulfa Drugs      Created by Conversion - 0;    . No Known Latex Allergy        Current Medications:  Patient's Medications   New Prescriptions    No medications on file   Previous Medications    ASPIRIN 81 MG TABLET    Take 81 mg by mouth daily    CLINDAMYCIN (CLEOCIN) 300 MG CAPSULE    Take 1 capsule (300 mg total) by mouth 3 times daily    EPINEPHRINE (EPIPEN) 0.3 MG/0.3ML SOAJ INJECTION    INJECT 0.3ML INTRAMUSCULARLY AS DIRECTED    MIRABEGRON (MYRBETRIQ) 25 MG 24 HR TABLET    Take 1 tablet (25 mg total) by mouth daily    NAPROXEN SODIUM (ANAPROX) 220 MG TABLET    Take 220 mg by mouth daily    TAMSULOSIN (FLOMAX) 0.4 MG    TAKE 1 CAPSULE (0.4 MG TOTAL) BY MOUTH EVERY EVENING   Modified Medications    No medications on file   Discontinued Medications    No medications on file         PMH:   Active Ambulatory Problems     Diagnosis Date Noted   . Cystitis 09/11/2008   . Overactive bladder 02/17/2011   . UTI (lower urinary tract infection) 02/17/2011   . Cataract    . Cystoid macular edema of left eye 02/20/2011   . Refractive error 03/27/2011   . IC (interstitial cystitis) 06/30/2011   . OAB (overactive bladder) 06/30/2011   . Interstitial cystitis 02/25/2012   . Urinary tract infection, site not specified 02/25/2012   . Incomplete bladder emptying 08/22/2012     Resolved Ambulatory Problems     Diagnosis Date Noted   . No Resolved Ambulatory Problems     Past Medical History   Diagnosis Date   . Macular edema    . Chronic kidney disease    . Varicella          Past Surgical History   Procedure Laterality Date   . Hx tonsillectomy/adenoidectomy       Tonsillectomy Conversion Data    . Cesarean section,  classic       Cesarean Section Conversion Data    . Bunionectomy       Hallux Valgus (Bunion) Correction Conversion Data    . Cataract removal  10/24/09     PC IOL OS   . Retinal detachment surgery       X 3   . 25g ppv/ivt/afx/el/22%sf6 os  05/16/09   . Sb/20g ppv/mo/pfc/rtx/afx/15% c3 f8 os  06/20/09   . Barrier lpc os  07/30/09   . Phaco/iol/25g ppv/pcox/mp/afx/icg/ilmx/14% c3 f8 os  10/24/09   . Intra vitreal triesence os  05/30/10     4MG    . Avastin #1 os 08/14/11 consent 08/14/11 maxitrol           FH:  Family History   Problem Relation Age of Onset   . Conversion Other      [id number]  Mellitus^250.00^Active^   . Conversion Other      [id number]^BJYNW E5977304.9^Active^   . Conversion Other      [id number]^Hypertension^401.9^Active^   . Glaucoma Mother    . Stroke Mother    . Glaucoma Father    . Cataracts Father    . Retinal detachment Father    . Diabetes Father    . Heart disease Father    . Stroke Father      Denies family history of skin cancer or skin conditions.    SocH:  History     Social History   . Marital Status: Married     Spouse Name: N/A     Number of Children: N/A   . Years of Education: N/A     Occupational History   . Not on file.     Social History Main Topics   . Smoking status: Former Smoker     Types: Cigarettes     Quit date: 02/15/1981   . Smokeless tobacco: Not on file   . Alcohol Use: Yes   . Drug Use: No   . Sexually Active: Not on file     Other Topics Concern   . Not on file     Social History Narrative   . No narrative on file         PE  Filed Vitals:    12/30/12 1528   BP: 128/78   Height: 1.575 m (5\' 2" )   Weight: 87.091 kg (192 lb)     General: Awake and alert   All of the following were examined and found to be within normal limits except as noted below:  -Face/Neck/Scalp:  -Chest/Abdomen/Back:  -BUE/hands:  -BLE/feet:  -Buttocks/Groin:  -Nails/Hair:      Barriers to learning: None.    Assessment/Plan:  1.         Francisco Capuchin, MD

## 2012-12-30 NOTE — Progress Notes (Addendum)
Consulting MD: Karen Kays, MD    Chief Complaint   Patient presents with   . Skin Exam     spot L arm       HPI: Pt is a 57 y.o. female who presents for evaluation and treatment of a spot on her left arm.  Patient denies any personal or family history of skin cancer but has had a dark spot on her left arm for the last few years.  Her friend who has melanoma saw the spot and advised her to come for evaluation.  She has no other areas of concern.    ROS: Pt is otherwise in normal state of health    Allergies:  Allergies   Allergen Reactions   . Sulfa Drugs      Created by Conversion - 0;    . No Known Latex Allergy        Current Medications:  Patient's Medications   New Prescriptions    No medications on file   Previous Medications    ASPIRIN 81 MG TABLET    Take 81 mg by mouth daily    CLINDAMYCIN (CLEOCIN) 300 MG CAPSULE    Take 1 capsule (300 mg total) by mouth 3 times daily    EPINEPHRINE (EPIPEN) 0.3 MG/0.3ML SOAJ INJECTION    INJECT 0.3ML INTRAMUSCULARLY AS DIRECTED    MIRABEGRON (MYRBETRIQ) 25 MG 24 HR TABLET    Take 1 tablet (25 mg total) by mouth daily    NAPROXEN SODIUM (ANAPROX) 220 MG TABLET    Take 220 mg by mouth daily    TAMSULOSIN (FLOMAX) 0.4 MG    TAKE 1 CAPSULE (0.4 MG TOTAL) BY MOUTH EVERY EVENING   Modified Medications    No medications on file   Discontinued Medications    No medications on file         PMH:   Active Ambulatory Problems     Diagnosis Date Noted   . Cystitis 09/11/2008   . Overactive bladder 02/17/2011   . UTI (lower urinary tract infection) 02/17/2011   . Cataract    . Cystoid macular edema of left eye 02/20/2011   . Refractive error 03/27/2011   . IC (interstitial cystitis) 06/30/2011   . OAB (overactive bladder) 06/30/2011   . Interstitial cystitis 02/25/2012   . Urinary tract infection, site not specified 02/25/2012   . Incomplete bladder emptying 08/22/2012     Resolved Ambulatory Problems     Diagnosis Date Noted   . No Resolved Ambulatory Problems     Past Medical  History   Diagnosis Date   . Macular edema    . Chronic kidney disease    . Varicella          Past Surgical History   Procedure Laterality Date   . Hx tonsillectomy/adenoidectomy       Tonsillectomy Conversion Data    . Cesarean section, classic       Cesarean Section Conversion Data    . Bunionectomy       Hallux Valgus (Bunion) Correction Conversion Data    . Cataract removal  10/24/09     PC IOL OS   . Retinal detachment surgery       X 3   . 25g ppv/ivt/afx/el/22%sf6 os  05/16/09   . Sb/20g ppv/mo/pfc/rtx/afx/15% c3 f8 os  06/20/09   . Barrier lpc os  07/30/09   . Phaco/iol/25g ppv/pcox/mp/afx/icg/ilmx/14% c3 f8 os  10/24/09   . Intra vitreal triesence os  05/30/10  4MG    . Avastin #1 os 08/14/11 consent 08/14/11 maxitrol           FH:  Family History   Problem Relation Age of Onset   . Conversion Other      [id number]^WGNFAOZH Mellitus^250.00^Active^   . Conversion Other      [id number]^NGEXB E5977304.9^Active^   . Conversion Other      20100326^Hypertension^401.9^Active^   . Glaucoma Mother    . Stroke Mother    . Glaucoma Father    . Cataracts Father    . Retinal detachment Father    . Diabetes Father    . Heart disease Father    . Stroke Father      Denies family history of skin cancer or skin conditions.    SocH:  History     Social History   . Marital Status: Married     Spouse Name: N/A     Number of Children: N/A   . Years of Education: N/A     Occupational History   . Not on file.     Social History Main Topics   . Smoking status: Former Smoker     Types: Cigarettes     Quit date: 02/15/1981   . Smokeless tobacco: Not on file   . Alcohol Use: Yes   . Drug Use: No   . Sexually Active: Not on file     Other Topics Concern   . Not on file     Social History Narrative   . No narrative on file         PE  Filed Vitals:    12/30/12 1528   BP: 128/78   Height: 1.575 m (5\' 2" )   Weight: 87.091 kg (192 lb)     General: Awake and alert   All of the following were examined and found to be within normal limits except  as noted below:  -Face/Neck/Scalp:  -Chest/Abdomen/Back:  -BUE/hands: on the left forearm there is a 5 x 6 mm light brown macule with irregular border  -BLE/feet:  -Buttocks/Groin:  -Nails/Hair:    Barriers to learning: None.    Assessment/Plan:  1.  Likely thin seborrheic keratosis of the left forearm  -discussed likely diagnosis but given the irregular borders would like to rule out possible melanoma  -Shave biopsy recommended for definitive diagnosis.  -Procedure: Informed consent obtained.  Area was cleansed with hydrogen peroxide and Hibiclens. 2 cc Lidocaine with 1% epinephrine was used for anesthesia.  Shave biopsy was performed with a gillette blade, aluminum chloride was used for hemostasis.  Wound was covered with Aquaphor and a bandage.  Pt tolerated the procedure well. Wound care instructions were reviewed.   -We will call the patient with the results.    Time-Out       Procedure : Shave Biopsy  Site: left forearm  Preprocedure verification conducted prior to Time Out? Yes  Time Out completed at: 4:30  Start time: 4:30  Stop time: 4:35 PM  Procedure Timeout Participants Self, nurse  Preprocedure verification conducted prior to Time Out? Yes  Time Out completed at: 4:30    Consents Obtained Yes  Correct Patient (Use 2 Identifiers) Yes  Correct Procedure Yes  Correct Level Yes  Correct Site Yes  Site Marked? Yes  Correct Patient Position: Yes  Correct Equipment/Implants Available Yes  Imaging Studies Available: N/A    Procedure Prep/Precautions:   Appropriate Hand Hygiene Used: Yes  Procedure site prep indicated? Yes  Chlorhexidine Skin Prep  Used and Allowed to Dry : Yes  Full Barrier in Place and Maintained Throughout Procedure Yes    Follow-up as needed    Amy Teresa Coombs, MD          I saw and evaluated the patient. I agree with the resident's/fellow's findings and plan of care as documented above.    Hermine Messick, MD    I was present and I participated during the critical and key portions of this  procedure, and I was immediately available during the remainder of the procedure.    Hermine Messick, MD

## 2013-01-02 ENCOUNTER — Ambulatory Visit: Payer: Self-pay | Admitting: Orthopedic Surgery

## 2013-01-02 ENCOUNTER — Encounter: Payer: Self-pay | Admitting: Orthopedic Surgery

## 2013-01-02 VITALS — BP 151/87 | HR 67 | Ht 62.0 in | Wt 192.0 lb

## 2013-01-02 DIAGNOSIS — M722 Plantar fascial fibromatosis: Secondary | ICD-10-CM | POA: Insufficient documentation

## 2013-01-02 DIAGNOSIS — M25579 Pain in unspecified ankle and joints of unspecified foot: Secondary | ICD-10-CM

## 2013-01-02 DIAGNOSIS — M6700 Short Achilles tendon (acquired), unspecified ankle: Secondary | ICD-10-CM | POA: Insufficient documentation

## 2013-01-02 NOTE — Progress Notes (Signed)
Orthopaedic Visit:  Consult / New Problem  Veronica Bishop, MRN: [id number]  Date of visit: 01/02/2013    CC: Right foot and heel  pain  -2nd opinion, referred by Dr Erling Cruz    Subjective:     Date of visit: 01/02/2013  Symptoms duration:  Feb 2013, and again worsened in Jan 2014  Injury:  None, but more severe acute pain in Jan 2014  Made worse by:  walking  Made better by:  rest  Treatments to date:  Podiatrist nearer to home, 2 injections last nov 2013 - some temp improvement; hard orthotics which do not seem to help  Exercise type /ability to do:  Likes to walk, but cannot - bike and swim okay  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      Objective:    Alert and oriented x 3.  Pleasant, cooperative, appropriate.  Right and Left lower extremities: Palpable pulses, neurovascularly intact.    Calf soft and nontender.  RIGHT LEG/FOOT:  Swelling:  None at ankle or foot.  Tenderness:  Plantar medial heel over origin plantar fascia.  Plantar fascia:  Firm without evidence of significant attenuation.  Medial hindfoot:  Mild tenderness or swelling distal tarsal tunnel / abductor hallucis muscle.  Equinus contracture:  Positive Silverskold test.    Calcaneal squeeze test: Negative.          Imaging studies:   01-02-13 foot:  No stress FX or sig djd - posterior and plantar heel spurs      Impression:  57 y.o. female, right foot and heel pain.  Consistent with acute on chronic proximal plantar fasciitis - possible component of neuritis - distal tarsal tunnel - but lesser component.  Also AT contracture, contributing  I ordered the staff to fit the patient with:       Plan:    1. Immobilization:None  2. Weight bearing status:WB  3. DVT  prophylaxis:None  4. Anti-inflammatory / Pain medication:  Ice, otc nsaids 1-2 weeks   5. Orthotics:  Switch to soft OTC rather than hard 3/4 orthotics she has  6. Therapy:  Home plantar fascia and AT stretch instructed  7. Exercise:  Bike, swim, okay - may start walking in 4-6 weeks if improving  8. Bone Health: Vitamin D3:             Calcium:   9. Work Status: Patient working - Yes.    10. Follow - up: prn 6-8 weeks if not improving .     For the next visit:  Follow-Up Needs / I am Ordering:  1. X-rays next visit: .  2. Room:  3. Cast:      Jenna Luo, MD  Professor of Orthopaedics  [facility] [employer] [facility] [employer]

## 2013-01-04 LAB — SURGICAL PATHOLOGY

## 2013-08-07 ENCOUNTER — Other Ambulatory Visit: Payer: Self-pay | Admitting: Urology

## 2013-08-15 ENCOUNTER — Ambulatory Visit
Admit: 2013-08-15 | Discharge: 2013-08-15 | Disposition: A | Discharge status: Home / Self Care | Payer: Self-pay | Source: Ambulatory Visit | Attending: Urology | Admitting: Urology

## 2013-08-15 ENCOUNTER — Other Ambulatory Visit: Payer: Self-pay | Admitting: Urology

## 2013-08-15 ENCOUNTER — Telehealth: Payer: Self-pay | Admitting: Urology

## 2013-08-15 DIAGNOSIS — N390 Urinary tract infection, site not specified: Secondary | ICD-10-CM

## 2013-08-15 NOTE — Telephone Encounter (Signed)
Macrobid BID for 7 days.  Dave

## 2013-08-15 NOTE — Telephone Encounter (Signed)
That's fine.  Cipro 250 mgs BID for 7 days.  Theodoro Grist

## 2013-08-15 NOTE — Telephone Encounter (Signed)
Pt. With UTI sx. Running a low grade fever, burning, frequency going to do a C & S at [facility] asking to be treated?? Allergic to sulfa

## 2013-08-15 NOTE — Telephone Encounter (Signed)
Patient informed. Script called in.

## 2013-08-15 NOTE — Telephone Encounter (Signed)
Patient states [place] was [place] and [place] [employer].  Can we call Cipro instead? Veronica Bishop

## 2013-08-15 NOTE — Telephone Encounter (Signed)
Patient calling stating she has UTI with Burning. Pt. Would like to drop off urine sample today. Please call patient at  [phone].

## 2013-08-16 LAB — AEROBIC CULTURE: Aerobic Culture: 0

## 2013-08-21 ENCOUNTER — Ambulatory Visit: Payer: Self-pay | Admitting: Primary Care

## 2013-08-21 ENCOUNTER — Encounter: Payer: Self-pay | Admitting: Primary Care

## 2013-08-21 ENCOUNTER — Ambulatory Visit: Payer: Self-pay | Admitting: Urology

## 2013-08-21 VITALS — BP 140/80 | HR 74 | Ht 62.0 in | Wt 202.8 lb

## 2013-08-21 DIAGNOSIS — M7750 Other enthesopathy of unspecified foot: Secondary | ICD-10-CM

## 2013-08-21 NOTE — Progress Notes (Signed)
Ms. Stoyer dropped an object from her fridge on her foot on Thanksgiving.  She has pain after walking but is better after resting.  She has not tried ice but has elevated her foot and used Ibuprofen with some improvement.  She has been careful with which shoes she wears but her foot always hurts especially at the end of the day.      Patients meds and allergies are reviewed today and there are no changes in family history.  See electronic record for details.    BP 140/80  Pulse 74  Ht 1.575 m (5\' 2" )  Wt 91.989 kg (202 lb 12.8 oz)  BMI 37.08 kg/m2  LMP 06/01/2013 Body mass index is 37.08 kg/(m^2).    GENERAL APPEARANCE: appears stated age, well appearing, no apparent distress.  EYES: conjunctiva benign,   MOUTH: wet.  EXTREMITIES: Without clubbing, cyanosis, or edema; Pain with movement under pressure of 2nd flexor tendon on right foot  NEUROLOGIC: Motor strength bilaterally 5/5 and symmetrical in all large muscle groups of the limbs.  Normal sensory ability to light touch in the extremities.  Gait appears normal.   SKIN:  no rash  PSYCH: cheerful, animated, well groomed, good eye contact.    DISCUSSION 57 y.o. year old female with FOOT INJURY    FLEXOR TENDONITIS - pain to palpation of mid foot flexor tendon of the 2nd toe on the left foot.  Elevate, ice, avoid tight shoes.      HCM  Immunization History   Administered Date(s) Administered   . Influenza Injectable Quadrivalent with preservati 08/21/2013   . Tdap 09/10/2010     Health Maintenance   Topic Date Due   . Hepatitis C Screening Offered  1956/03/16   . Imm-influenza (#1) 04/24/2013   . Breast Cancer Screening  04/26/2013   . Colon Cancer Screening 10 Year Colonoscopy  08/26/2013   . Hiv Testing Offered  Addressed       NEEDS MAMMOGRAM, COLONOSCOPY, HEPATITIS C and othe labs  DECLINES HIV TESTING     Follow up twomonths  If not better and as needed  .

## 2013-08-30 ENCOUNTER — Telehealth: Payer: Self-pay | Admitting: Urology

## 2013-08-30 NOTE — Telephone Encounter (Signed)
Left message to reschedule the 09/06/13 appt. In the [place] office to 09/13/13 in the [place] office

## 2013-08-31 NOTE — Telephone Encounter (Signed)
Scheduled patient for 09/20/13 in the [place] location.  Left a vm with the date and time change for the patient.

## 2013-08-31 NOTE — Telephone Encounter (Signed)
Veronica Bishop returning call to office regarding messages she received. Veronica Bishop advised will not be able to make bumped appointment that was rescheduled for 09/13/13 because she will be out of town. Veronica Bishop would like a call back to see if there is any way she could be squeezed in for an appointment at either [place] or [facility] between 1/26-1/30 as she will be in town on those days. Please call patient back to discuss at [phone].

## 2013-09-06 ENCOUNTER — Ambulatory Visit: Payer: Self-pay | Admitting: Urology

## 2013-09-13 ENCOUNTER — Ambulatory Visit: Payer: Self-pay | Admitting: Urology

## 2013-09-20 ENCOUNTER — Ambulatory Visit: Payer: Self-pay | Admitting: Urology

## 2013-09-20 ENCOUNTER — Encounter: Payer: Self-pay | Admitting: Urology

## 2013-09-20 VITALS — BP 122/74 | Ht 62.0 in | Wt 192.0 lb

## 2013-09-20 DIAGNOSIS — R339 Retention of urine, unspecified: Secondary | ICD-10-CM

## 2013-09-20 DIAGNOSIS — N3281 Overactive bladder: Secondary | ICD-10-CM

## 2013-09-20 DIAGNOSIS — N3010 Interstitial cystitis (chronic) without hematuria: Secondary | ICD-10-CM

## 2013-09-20 DIAGNOSIS — N390 Urinary tract infection, site not specified: Secondary | ICD-10-CM

## 2013-09-20 DIAGNOSIS — Z1389 Encounter for screening for other disorder: Secondary | ICD-10-CM

## 2013-09-20 LAB — URINALYSIS WITH MICROSCOPIC
Blood,UA: NEGATIVE
Ketones, UA: NEGATIVE
Nitrite,UA: NEGATIVE
Protein,UA: NEGATIVE mg/dL
RBC,UA: NONE SEEN /hpf (ref 0–2)
Specific Gravity,UA: 1.021 (ref 1.002–1.030)
WBC,UA: 7 /hpf — AB (ref 0–5)
pH,UA: 5 (ref 5.0–8.0)

## 2013-09-20 LAB — POCT URINALYSIS DIPSTICK
Blood,UA POCT: NEGATIVE
Glucose,UA POCT: NORMAL
Ketones,UA POCT: NEGATIVE
Leuk Esterase,UA POCT: NEGATIVE
Lot #: 23068903
Nitrite,UA POCT: NEGATIVE
PH,UA POCT: 5 (ref 5–8)
Protein,UA POCT: NEGATIVE mg/dL
Specific gravity,UA POCT: 1.02 (ref 1.002–1.03)

## 2013-09-20 MED ORDER — NITROFURANTOIN MONOHYD MACRO 100 MG PO CAPS *I*
100.0000 mg | ORAL_CAPSULE | Freq: Two times a day (BID) | ORAL | Status: DC
Start: 2013-09-20 — End: 2013-11-06

## 2013-09-20 MED ORDER — CIPROFLOXACIN HCL 250 MG PO TABS *I*
250.0000 mg | ORAL_TABLET | Freq: Two times a day (BID) | ORAL | Status: DC
Start: 2013-09-20 — End: 2013-11-06

## 2013-09-20 NOTE — Progress Notes (Signed)
Visit Diagnosis(es) or CC: UTIs, OAB, IC, Elevated PVR    HPI:  The patient comes to the office today in follow up for the above diagnosis (-es).   OAB/IC  She is on Myrbertriq 25 at present.  She feels the Detrol worked better, but her Market researcher cover it.    She is also on tamsulosin.   She finds this of critical importance for her symptoms.  She is having more discomfort during intercourse at this point.  It is definitely related to pressure on the anterior vaginal wall.  Sexual positions that minimize this are much form comfortable for her.   UTIs  Since her last visit she has had only one infection.  She self-medicated with Cipro (she was in [place]).  She had recent symptoms as well, but a culture was negative (08-15-13).  She is on no antibiotic prophylaxis.  She again likes having the Cipro and Macrobid on hand.    PMH:    No changes reported    PSH:  No changes reported    FH:   No new cancer diagnoses      SH:  No changes reported    Review of Systems:    Systemic: Denies recent weight loss, weight gain, fatigue, fever, chills.  Neuro: Denies headache, dizziness.  Heart:: Denies chest pain or palpitations.  Respiratory: Denies shortness of breath.  G.I.: Denies change in bowel habits, blood in stool, reflux.  GU: Denies urogenital complaints, apart from what is documented in the HPI.  Musculoskeletal: Denies new muscle or joint pain.  Right foot heal spur improved.   Now having left foot pain.    Eyes: Denies change in vision.  Ears: Denies change in hearing.    Neck: Denies stiff neck or neck pain.  Skin: Denies rash.  Psych: Denies depression.     Objective:    GENERAL:  No acute distress.  Well developed, well nourished.  NEUROLOGIC:  Oriented to person, place, time, and situation  PSYCHIATRIC:  Normal mood and affect  HEENT:  NC/AT.  Sclerae anicteric.  Nose/mouth/ears without obvious lesions.  NECK:  Trachea midline.  No JVD in sitting position.    SKIN:  Normal color, turgor, texture,  hydration  LUNGS:  Respirations unlabored.    HEART:  Regular rate and rhythm    BACK/ORTHO:  No CVA tenderness.  No obvious back deformities.  No tenderness of the axial skeleton.    ABDOMEN:  Soft, nontender, nondistended, and without masses.  No evidence of bladder distention.  No inguinal hernias.     PVR:   55 ccs    CF 102 ccs      ASSESSMENT:   UTIs    There is no clinical evidence of disease at this time.     Infrequent recurrences    She does not want prophylaxis    She has no current symptoms   OAB/IC    Less well controlled with Myrbetriq 25    She cannot go back on Detrol   Elevated PVR    Much improved  PLAN:   Continue Myrbetriq but increase to 50 mgs    Lot No.: [id number]    Exp. Date: 11/2015   If ineffective, will try switching her back to Detrol   Check urine culture, cytology      FOLLOW-UP:   One month by phone   One year    LOS:      [place], [phone]    Time spent in counseling  and coordination of care (if used for LOS):

## 2013-09-21 ENCOUNTER — Encounter: Payer: Self-pay | Admitting: Urology

## 2013-09-22 LAB — MEDICAL CYTOLOGY

## 2013-09-23 LAB — AEROBIC CULTURE

## 2013-09-29 ENCOUNTER — Telehealth: Payer: Self-pay | Admitting: Urology

## 2013-09-29 NOTE — Telephone Encounter (Signed)
Pharmacy is just calling to see if pt. was taking both nitrofurantoin and cipro together and i told them no.

## 2013-09-29 NOTE — Telephone Encounter (Signed)
[facility] is calling to speak to someone about a possible drug interaction.  Please call [phone].

## 2013-10-16 ENCOUNTER — Telehealth: Payer: Self-pay | Admitting: Urology

## 2013-10-16 DIAGNOSIS — N390 Urinary tract infection, site not specified: Secondary | ICD-10-CM

## 2013-10-16 NOTE — Telephone Encounter (Signed)
Please have her bring in a specimen for culture first.  In the meantime, I'd like her to continue macrobid.  Thanks,  Waunita Schooner

## 2013-10-16 NOTE — Telephone Encounter (Signed)
Veronica Bishop calling because she had a UTI diagnosed at the end of January and was out of town. Patient already had some macrobid on her so she took that twice a day for 7 days as per Rosanne but still does not feel like the UTI has gone away due to dysuria and urinary frequency. Patient states that due to bacteria in urine, Rosanne stated macrobid may not clear up infection totally so Veronica Bishop is calling back to see if she should be prescribed another type of antibiotic to treat the UTI now to fully get rid of all symptoms. Patient can be reached back to discuss at [phone].

## 2013-10-16 NOTE — Telephone Encounter (Signed)
Patient informed, she will drop of a urine for culture to [facility] lab in [place].  Order placed

## 2013-10-16 NOTE — Telephone Encounter (Signed)
Dr. Vernon Prey, please look at last urine culture result, it was "I" for Nitrofurantoin which she took, she is still symptomatic,  Do you want to just prescribe something else or have her get another urine for culture first? Veronica Bishop

## 2013-10-17 ENCOUNTER — Ambulatory Visit
Admit: 2013-10-17 | Discharge: 2013-10-17 | Disposition: A | Discharge status: Home / Self Care | Payer: Self-pay | Source: Ambulatory Visit | Attending: Primary Care | Admitting: Primary Care

## 2013-10-17 DIAGNOSIS — N390 Urinary tract infection, site not specified: Secondary | ICD-10-CM

## 2013-10-17 DIAGNOSIS — Z139 Encounter for screening, unspecified: Secondary | ICD-10-CM

## 2013-10-18 LAB — HEPATITIS C ANTIBODY: Hep C Ab: NEGATIVE

## 2013-10-19 LAB — AEROBIC CULTURE

## 2013-10-20 ENCOUNTER — Telehealth: Payer: Self-pay | Admitting: Urology

## 2013-10-20 NOTE — Telephone Encounter (Signed)
Pt. Aware of positive urine and macrobid being called to pharmacy .Marland Kitchen She is willing to have the IM injrctions on her return to [place] if the [place] doesn't work.

## 2013-10-20 NOTE — Telephone Encounter (Signed)
Ms. Pryer is requesting a return call to review her recent test results.    What type of test was done? Urine (Clean catch, voided, midstream)       Where was the test done? [facility]    On what day was the test done?  10/17/2013    Please return the patients call at [phone]    Pharmacy confirmed.

## 2013-10-31 NOTE — Patient Instructions (Signed)
·   Did you know that if you are insured, a SCREENING mammogram is no cost to you, due to the Affordable Care Act?  · If you have no insurance there is FREE screening available.*    *Free screening is for woman age 57 and older and women age 18 to 39 at high risk per their provider or who are experiencing symptoms - [phone].

## 2013-11-01 ENCOUNTER — Ambulatory Visit: Payer: Self-pay | Admitting: Primary Care

## 2013-11-01 ENCOUNTER — Telehealth: Payer: Self-pay | Admitting: Primary Care

## 2013-11-01 ENCOUNTER — Encounter: Payer: Self-pay | Admitting: Primary Care

## 2013-11-01 ENCOUNTER — Telehealth: Payer: Self-pay | Admitting: Urology

## 2013-11-01 VITALS — BP 118/70 | HR 80 | Ht 62.0 in | Wt 188.6 lb

## 2013-11-01 DIAGNOSIS — J069 Acute upper respiratory infection, unspecified: Secondary | ICD-10-CM

## 2013-11-01 DIAGNOSIS — Z139 Encounter for screening, unspecified: Secondary | ICD-10-CM

## 2013-11-01 DIAGNOSIS — R300 Dysuria: Secondary | ICD-10-CM

## 2013-11-01 DIAGNOSIS — R350 Frequency of micturition: Secondary | ICD-10-CM

## 2013-11-01 LAB — URINALYSIS WITH MICROSCOPIC
Ketones, UA: NEGATIVE
Nitrite,UA: POSITIVE — AB
Protein,UA: 30 mg/dL — AB
RBC,UA: 8 /hpf — AB (ref 0–2)
Specific Gravity,UA: 1.015 (ref 1.002–1.030)
WBC,UA: 149 /hpf — ABNORMAL HIGH (ref 0–5)
pH,UA: 5 (ref 5.0–8.0)

## 2013-11-01 MED ORDER — CEFUROXIME AXETIL 250 MG PO TABS *I*
250.0000 mg | ORAL_TABLET | Freq: Two times a day (BID) | ORAL | Status: DC
Start: 2013-11-01 — End: 2013-11-06

## 2013-11-01 MED ORDER — PROMETHAZINE-CODEINE 6.25-10 MG/5ML PO SYRP *A*
ORAL_SOLUTION | ORAL | Status: AC
Start: 2013-11-01 — End: 2013-11-15

## 2013-11-01 MED ORDER — ZOSTER VACCINE LIVE 19400 UNT/0.65ML SC SOLR *I*
19400.0000 [IU] | Freq: Once | SUBCUTANEOUS | Status: DC
Start: 2013-11-01 — End: 2013-11-01

## 2013-11-01 MED ORDER — ZOSTER VACCINE LIVE 19400 UNT/0.65ML SC SOLR *I*
19400.0000 [IU] | Freq: Once | SUBCUTANEOUS | Status: AC
Start: 2013-11-01 — End: 2013-11-01

## 2013-11-01 NOTE — Telephone Encounter (Signed)
Anaily called to advise that myrbetriq 50 mg is working and would like a script sent to [facility]. Please contact Sabria at [phone].

## 2013-11-01 NOTE — Progress Notes (Signed)
Veronica Bishop is a 58 y.o. year old who has been ill since Wednesday last week.    She did get flu shot .  She has had a cough and runny/stuffy nose.  She has had no fever and no chills.  This was not measured.   She has no myalgia.     She has had sore throat.  She is hoarse.  She has headache and no earache.  Veronica Bishop has had no nausea, vomiting or diarrhea.  She has not been eating as well and has been sleeping more than usual.   She has been taking nyquil with some symptom improvement.  She does not smoke.  Veronica Bishop has traveled recently to [place].    Pain with urination and frequency but no back pain or fever.  She has frequent UTIs.  She was given septra last time but didn't take it because she is allergic.    Shingles shot    Patients meds and allergies are reviewed today and there are no changes in family history.  See electronic record for details.    BP 118/70    Pulse 80    Ht 1.575 m (5\' 2" )    Wt 85.548 kg (188 lb 9.6 oz)    BMI 34.49 kg/m2       GENERAL APPEARANCE: appears stated age, TIRED  EYES: conjunctiva benign   EARS: canals normal TM gray and shiny  MOUTH: wet, no exudate, oropharynx clear. Nose STUFFY  NECK: supple, 2 to 2 cm adenapathy in the anterior cervical chain  LUNGS: Clear to auscultation, no WHEEZING or crackles. No retractions.    HEART: regular rhythm and rate with no murmurs, JVP flat  BACK: no CVA tenderness  EXTREMITIES: Without clubbing, cyanosis, or edema  NEUROLOGIC: Alert and oriented times three, cranial nerves II through XII intact by observation, motor/sensory exam appear normal, normal gait.  SKIN:  no rash  PSYCH: alert, well groomed, good eye contact.    DISCUSSION  58 y.o. year old with nonfocal complaints and exam    VIRAL SYNDROME  cw adenovirus    Nighttime cough - Promethazine with codeine 1 to 2 teas every four to six hours while trying to sleep. (pt informed that this medicine can cause addiction, constipation, sleepiness)    DISCOMFORT  Ibuprofen 800 tid  as needed or  Tylenol 500 mg two every five hours as needed    Follow-up immediately for increased work of breathing, being out of it, excessive pain or if without gradual improvement after 7 days.    RECURRENT UTI  She is taking urostat so we will do microscopic and treat if suggestive while we wait for culture as she will be going to [place] again    [place] and FAMILY with SHINGLES  She would like the shot.    HCM  Immunization History   Administered Date(s) Administered    Influenza Injectable Quadrivalent with preservati 08/21/2013    Tdap 09/10/2010     Health Maintenance   Topic Date Due    CERVICAL CANCER SCREEN PAP EVERY 1 YEAR  04/10/1970    BREAST CANCER SCREENING  04/26/2013    COLON CANCER SCREENING 10 YEAR COLONOSCOPY  08/26/2013    HIV TESTING OFFERED  Addressed    IMM-INFLUENZA  Completed    HEPATITIS C SCREENING OFFERED  Completed       NEEDS mammogram and colonoscopy    Follow up as needed .

## 2013-11-01 NOTE — Telephone Encounter (Signed)
Let her know that she does have a UTI and I have sent in a prescription to the pharmacy

## 2013-11-02 ENCOUNTER — Telehealth: Payer: Self-pay | Admitting: Urology

## 2013-11-02 ENCOUNTER — Other Ambulatory Visit: Payer: Self-pay | Admitting: Urology

## 2013-11-02 MED ORDER — MIRABEGRON ER 50 MG PO TB24 *I*
50.0000 mg | ORAL_TABLET | Freq: Every day | ORAL | Status: DC
Start: 2013-11-02 — End: 2013-11-06

## 2013-11-02 NOTE — Telephone Encounter (Signed)
If she is not doing better, I have no objection to her trying keflex.  The culture was not tested against keflex, so I can't guarantee her that it will work.  It was sensitive to cefazolin, which is similar to keflex.  Veronica Bishop

## 2013-11-02 NOTE — Telephone Encounter (Signed)
I called this patient, she states she cancelled a business trip for next week because she is scheduled to go to [place] in a couple of weeks, she is still symptomatic of a UTI, and would like a definitive treatment.  She is willing to come in every day next week for IM injections if needed.  (of note she also currently has a virus, which there is not treatment for, accept to wait it out)  Please advise and I will arrange.  [employer]

## 2013-11-02 NOTE — Telephone Encounter (Signed)
She can come in for IM gentamicin or Amikacin, whichever one we have.  Veronica Bishop

## 2013-11-02 NOTE — Telephone Encounter (Signed)
Dr. Vernon Prey, please read below and look at chart.  I did RHIO on her there are no recent urine cultures, but there is a U/A from 11/01/13.  Veronica Bishop

## 2013-11-02 NOTE — Telephone Encounter (Signed)
Patient calling to advise she saw Dr Josue Hector yesterday and she did a urinalysis. She told patient to try Cephalexin due to Sulfa Drug allergy, but patient was told by Dr Vernon Prey that the only other thing that would work is injections. She is requesting a nurse or Dr Vernon Prey to look at her results (in [place]) and to return her call at [phone] today to advise if Cephalexin will work or if she should just start injections.

## 2013-11-02 NOTE — Telephone Encounter (Signed)
Sent to [facility] for refill

## 2013-11-02 NOTE — Telephone Encounter (Signed)
Patient is aware, she wanted Dr. Vernon Prey to get results as this has been going on.

## 2013-11-02 NOTE — Telephone Encounter (Signed)
Jamilyn called to advise that myrbetriq 50 mg is working and would like a script sent to [facility]. Please contact Jacquelynne at [phone].

## 2013-11-02 NOTE — Telephone Encounter (Signed)
Patient informed, transferred to [facility] to book appointments.

## 2013-11-06 ENCOUNTER — Encounter: Payer: Self-pay | Admitting: Urology

## 2013-11-06 ENCOUNTER — Ambulatory Visit: Payer: Self-pay | Admitting: Urology

## 2013-11-06 VITALS — BP 153/81 | HR 68 | Ht 62.0 in | Wt 188.0 lb

## 2013-11-06 DIAGNOSIS — N390 Urinary tract infection, site not specified: Secondary | ICD-10-CM

## 2013-11-06 DIAGNOSIS — Z1389 Encounter for screening for other disorder: Secondary | ICD-10-CM

## 2013-11-06 LAB — POCT URINALYSIS DIPSTICK
Blood,UA POCT: NEGATIVE
Glucose,UA POCT: NORMAL
Ketones,UA POCT: NEGATIVE
Leuk Esterase,UA POCT: 1 — AB
Lot #: 23068903
Nitrite,UA POCT: POSITIVE — AB
PH,UA POCT: 5 (ref 5–8)
Protein,UA POCT: NEGATIVE mg/dL
Specific gravity,UA POCT: 1.02 (ref 1.002–1.03)

## 2013-11-06 MED ORDER — MIRABEGRON ER 50 MG PO TB24 *I*
50.0000 mg | ORAL_TABLET | Freq: Every day | ORAL | Status: DC
Start: 2013-11-06 — End: 2014-09-25

## 2013-11-06 MED ORDER — CEPHALEXIN 500 MG PO CAPS *I*
500.0000 mg | ORAL_CAPSULE | Freq: Four times a day (QID) | ORAL | Status: AC
Start: 2013-11-06 — End: 2013-11-13

## 2013-11-06 NOTE — Progress Notes (Addendum)
Interim visit  PriorDiagnosis(es) or CC: UTIs, OAB, IC, Elevated PVR    HPI:  Diagnosed with UTI on 09/20/13.  She was prescribed Macrobid.  Culture showed intermediate resistance.  He reported feeling better after the first several days but then symptoms returned.  She continued Macrobid for an additional 10 days.  Report she continues with symptoms including burning and frequency.  Denies fevers and hematuria.     OAB/IC  She is on Myrbertriq 50 at present.   Reports this has made a difference.  She is also on tamsulosin.   She finds this of critical importance for her symptoms.  She is having more discomfort during intercourse at this point.  It is definitely related to pressure on the anterior vaginal wall.  Sexual positions that minimize this are much form comfortable for her.   UTIs  She self-medicated with Cipro (she was in [place]).  She is also self medicated with Macrobid in the past.     Objective:    PHYSICAL EXAM:  Generally healthy appearing. No apparent distress.  Pleasant, with an appropriate mood and affect.  Alert and oriented x3. No gross cranial nerve deficits.  Normal skin color without diaphoresis.  Respirations unlabored.  No lower extremity edema.  Appropriate strength in both upper and lower extremities.     Recent Results (from the past 24 hour(s))   POCT URINALYSIS DIPSTICK    Collection Time     11/06/13  5:12 PM       Result Value Range    Specific gravity,UA POCT 1.020  1.002 - 1.03    PH,UA POCT 5.0  5 - 8    Leuk Esterase,UA POCT +1 (*) Negative    Nitrite,UA POCT Positive (*) Negative    Protein,UA POCT Negative  Negative mg/dL    Glucose,UA POCT Normal  Normal    Ketones,UA POCT Negative  Negative    Urobilinogen,UA        Bilirubin,Ur    Negative    Blood,UA POCT Negative  Negative    Exp date 09/2014      Lot # [id number]         (A culture was not obtained for the following sample)   Ref. Range 11/01/2013 18:57   RBC,UA Latest Range: 0-2 /hpf 8 (A)   WBC,UA Latest Range: 0-5 /hpf 149  (H)   Bacteria,UA No range found 4+ (A)   Squam Epithel,UA Latest Range: 0-1+  3+ (A)   Renal Epithel,UA Latest Range: NONE  1+ (A)     Prior Urine Cultures   10/17/13:  >100 K./mL E. Coli.   09/20/13:  >100 K./mL E. Coli.    PVR:   Prior visit values:    18 ccs    CF 102 ccs      ASSESSMENT:   UTIs    Unsure of Macrobid had an effect on her most recent culture    Intermediate resistance to Macrobid    She understands she could have a completely different organism or strain at this point    She would like to avoid IM injection therapy if possible    I reviewed treatment with oral cephalosporins based on prior sensitivities   OAB/IC    Less well controlled with Myrbetriq 25    She cannot go back on Detrol   Elevated PVR    Much improved  PLAN:  Urine culture.  Begin cephalexin 500 mg by mouth 4 times a day.  Proper fluid intake was reviewed.  If she has progressive worsening in symptoms within the next 24-48 hours she will contact the office.  Await culture and sensitivity results within the next 48 hours.  If no improvement by then or if culture is resistant to cephalosporins then would begin IM gentamicin therapy on 11/08/13 x 5 days.   If this is the case patient would be taught self injection.  The patient was placed with the above plan.    Continue Myrbetriq 50 mg.  90 day supply prescription sent to her pharmacy.    FOLLOW-UP:   As previously scheduled, pending above.    The appointment was concluded after confirming with the patient that they fully understand the above treatment plan and denied having any further questions or concerns.  They were instructed to call if any issues develop prior to their next appointment.     Dictated using Systems analyst. To expedite communication, some inadvertent typographical and transcription errors generated by the transcription software may have been missed despite a reasonable effort to identify and correct them.    Addendum 12/13/13    Spoke with the  patient on the phone.  She has had recurrent UTIs.  Her E. Coli cultures have been lower colony counts, however, she has remained symptomatic.  She'll be placed on Macrobid 100 mg by mouth twice a day x7 days.  She has been compliant with previous recommendations.  Has noticed UTIs recur related to intercourse.  Urine culture ordered for post antibiotic sample.  Ordered retroperitoneal ultrasound.   Notify patient of results.  Will consider postcoital antibiotics in the future.

## 2013-11-06 NOTE — Patient Instructions (Signed)
PREVENTING URINARY TRACT INFECTIONS     Stay well hydrated so that you are urinating a good volume every few hours during the day.   If sexually active, urinate after intercourse.   Daily genital hygiene. Wash with soap and water using a wash cloth.   Do this more often if you are incontinent of urine or stool.    Do not use antibacterial soaps or wipes around the genitals.   Correct constipation if present.   Daily probiotic Yogurt such as Activia, [place] Yogurt, or Marathon Oil.   Vitamin C 250 mg (over-the-counter) 2-3 times a day, with food.   Consider Ester-C.   Cranberry pills (over-the-counter) as directed on the label. Do not take if you are on Coumadin or Warfarin.        DIETARY/ BEHAVIORAL RECOMMENDATIONS TO HELP MAINTAIN URINARY TRACT HEALTH    PROPER FLUID INTAKE   Drink approximately 8-10 cups of fluid per day (1 cup = 8 oz).    Try to get approximately 16 ounces within the first 30 minutes you are awake.   Any non-caffeinated/non-alcoholic fluid is OK  (water, juice, milk, lemonade, etc.)   However, some acidic beverages and foods can also be irritants.   Helps dilute urine to ease irritation, improving pain, urgency and spasms.   Helps treat and prevent urinary tract infections.   Improve flow of your urine stream.    Avoid Caffeine and Alcohol   Caffeine is commonly found in coffee, tea, soda and chocolate.   Alcoholic beverages include beer, wine, and liquor.   Caffeine and alcohol are both diuretics (making the kidney product more urine) and stimulants to the bladder.  For men they are irritants to the prostate as well.   Even decaffeinated coffee, tea, sodas, and artifical sweeteners can act as irritants.    Bland Diet   Foods that are spicy or acidic may also cause bladder irritation.   There are many other foods that could irritate the bladder. If you keep a diary of when you have a irritative urinary symptoms and also write down what you have had to ear or drink  during the previous couple of meals, you may find a correlation.    Avoid late evening fluid intake   The more you drink before bed the more likely you will need to urinate at night.   Generally, 2 hours before bed is enough time,  Sips between then are OK.    Timed urinating   Try to urinate about every 2-3 hrs during the day.  This can help avoid urgency and incontinence by keeping lower bladder volumes.   This will also help in the case of urinary retention.

## 2013-11-07 ENCOUNTER — Ambulatory Visit: Payer: Self-pay | Admitting: Urology

## 2013-11-08 ENCOUNTER — Ambulatory Visit: Payer: Self-pay | Admitting: Urology

## 2013-11-08 LAB — AEROBIC CULTURE

## 2013-11-09 ENCOUNTER — Ambulatory Visit: Payer: Self-pay | Admitting: Urology

## 2013-11-10 ENCOUNTER — Ambulatory Visit: Payer: Self-pay | Admitting: Urology

## 2013-11-19 ENCOUNTER — Telehealth: Payer: Self-pay | Admitting: Urology

## 2013-11-19 ENCOUNTER — Other Ambulatory Visit: Payer: Self-pay | Admitting: Urology

## 2013-11-19 DIAGNOSIS — N390 Urinary tract infection, site not specified: Secondary | ICD-10-CM

## 2013-11-19 MED ORDER — CEPHALEXIN 500 MG PO CAPS *I*
500.0000 mg | ORAL_CAPSULE | Freq: Four times a day (QID) | ORAL | Status: AC
Start: 2013-11-19 — End: 2013-11-29

## 2013-11-19 MED ORDER — CEFIXIME 400 MG PO CAPS *I*
400.0000 mg | ORAL_CAPSULE | Freq: Every day | ORAL | Status: DC
Start: 2013-11-19 — End: 2013-11-19

## 2013-11-19 NOTE — Telephone Encounter (Signed)
I called again. Spoke with patient.  Frequent UTIs. Recent UTI not respond to [place].  Did feel much better with Keflex.  Completed Keflex 1 week ago.  Again "symptomatic" (bruning with and after void, urgency) and heading out of country tomorrow x 1 week.      She is requesting order for culture for her to do tomorrow.  Also, will Rx antibiotics (which she does NOT plan on taking unless culture supports that Rx.  In interim, she will take phenazopyridine).    Already tried Keflex QID x 7 days.  Based on prior culture, will Rx Suprax to see if it clears the UTI.   -------------------------    Please follow up on final culture result and contact patient.  She will not be taking the Rx unless instructed to do so.

## 2013-11-19 NOTE — Telephone Encounter (Signed)
Paged by patient regarding UTI.  No answer.  Left VM message stating I would call again in a few minutes.

## 2013-11-19 NOTE — Telephone Encounter (Signed)
Patient paged again, as she had been contacted by pharmacy because Suprax is not available.  They do have Keflex.  Therefore, based on our prior discussion, will replace Rx for Suprax with 10 days course of Keflex to be taken only if culture confirms it to be appropriate.

## 2013-11-20 ENCOUNTER — Ambulatory Visit
Admit: 2013-11-20 | Discharge: 2013-11-20 | Disposition: A | Discharge status: Home / Self Care | Payer: Self-pay | Source: Ambulatory Visit | Attending: Urology | Admitting: Urology

## 2013-11-20 DIAGNOSIS — N390 Urinary tract infection, site not specified: Secondary | ICD-10-CM

## 2013-11-20 LAB — URINALYSIS WITH MICROSCOPIC
Blood,UA: NEGATIVE
Ketones, UA: NEGATIVE
Leuk Esterase,UA: NEGATIVE
Nitrite,UA: NEGATIVE
Protein,UA: NEGATIVE mg/dL
RBC,UA: 1 /hpf (ref 0–2)
Specific Gravity,UA: 1.008 (ref 1.002–1.030)
WBC,UA: 1 /hpf (ref 0–5)
pH,UA: 7 (ref 5.0–8.0)

## 2013-11-20 NOTE — Telephone Encounter (Signed)
Called [facility] in [place]. Patient picked up a Keflex prescription yesterday.

## 2013-11-20 NOTE — Telephone Encounter (Signed)
Left msg for pt to call back

## 2013-11-22 ENCOUNTER — Telehealth: Payer: Self-pay | Admitting: Urology

## 2013-11-22 NOTE — Telephone Encounter (Signed)
Left message letting pt know she needs to pick up rx for Keflex if she hasnt already.

## 2013-11-22 NOTE — Telephone Encounter (Signed)
Please see telephone encounter from 3.29.15. Patient is returning nurse call. Please contact at number provided.

## 2013-11-23 LAB — AEROBIC CULTURE

## 2013-11-24 ENCOUNTER — Ambulatory Visit: Payer: Self-pay | Admitting: Retina Ophthalmology

## 2013-11-24 ENCOUNTER — Ambulatory Visit: Payer: Self-pay

## 2013-11-24 ENCOUNTER — Encounter: Payer: Self-pay | Admitting: Retina Ophthalmology

## 2013-11-24 DIAGNOSIS — H35352 Cystoid macular degeneration, left eye: Secondary | ICD-10-CM

## 2013-11-24 NOTE — Assessment & Plan Note (Signed)
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

## 2013-11-24 NOTE — Progress Notes (Signed)
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   --S/P Avastin X 1 OS (08/13/12)   --RD repair X 3 (3/11)   --PCIOL OS, PCO OS   Lattice OD     Pt states vision is stable. (-) pain, (+) flashes OS, longstanding, (+)   floaters, longstanding        Current Outpatient Rx   Name  Route  Sig  Dispense  Refill    mirabegron (MYRBETRIQ) 50 MG 24 hr tablet    Oral    Take 1 tablet (50 mg total) by mouth daily    90 tablet    3      tamsulosin (FLOMAX) 0.4 MG        TAKE 1 CAPSULE (0.4 MG TOTAL) BY MOUTH EVERY EVENING    90 capsule    2      naproxen sodium (ANAPROX) 220 MG tablet    Oral    Take 220 mg by mouth daily              aspirin 81 MG tablet    Oral    Take 81 mg by mouth daily              EPINEPHrine (EPIPEN) 0.3 MG/0.3ML SOAJ injection        INJECT 0.3ML INTRAMUSCULARLY AS DIRECTED    1 Device    5       Created by Conversion      cephalexin (KEFLEX) 500 MG capsule    Oral    Take 1 capsule (500 mg total) by mouth 4 times daily for 10 days    40 capsule    0        Sulfa drugs and No known latex allergy   No birth history on file.  Past Medical History   Diagnosis Date    Macular edema      PERSISITENT PSEUDOPHAKIC OS    Cataract     Refractive error 03/27/2011    Chronic kidney disease     Varicella     IC (interstitial cystitis)       Past Surgical History   Procedure Laterality Date    Hx tonsillectomy/adenoidectomy       Tonsillectomy Conversion Data     Cesarean section, classic       Cesarean Section Conversion Data     Bunionectomy       Hallux Valgus (Bunion) Correction Conversion Data     Cataract removal  10/24/09     PC IOL OS    Retinal detachment surgery       X 3    25g ppv/ivt/afx/el/22%sf6 os  05/16/09    Sb/20g ppv/mo/pfc/rtx/afx/15% c3 f8 os  06/20/09    Barrier lpc os  07/30/09    Phaco/iol/25g  ppv/pcox/mp/afx/icg/ilmx/14% c3 f8 os  10/24/09    Intra vitreal triesence os  05/30/10     4MG     Avastin #1 os 08/14/11 consent 08/14/11 maxitrol        History   Smoking status    Former Smoker    Types: Cigarettes    Quit date: 02/15/1981   Smokeless tobacco    Not on file      History  Alcohol Use    Yes      History   Drug Use No      Specialty Problems       Ophthalmology Problems     Cataract         Cystoid macular edema of left eye         Refractive error               ROS    Positive for: Eyes    Negative for: Constitutional, Gastrointestinal, Neurological, Skin,   Genitourinary, Musculoskeletal, HENT, Endocrine, Cardiovascular,   Respiratory, Psychiatric, Allergic/Imm, Heme/Lymph       Objective:   ObjectiveThere were no vitals filed for this visit.    Base Eye Exam    Visual Acuity (Snellen - Linear)     Right Left   Dist cc 20/20 -1 20/400   Dist ph cc  NI       Correction:  Glasses      Tonometry (Tonopen, 3:20 PM)     Right Left   Pressure 18 17         Pupils     Pupils Dark React APD   Right PERRLA 3 Brisk None   Left PERRLA 3 Brisk +1         Neuro/Psych    Oriented x3:  Yes    Mood/Affect:  Normal      Dilation    Both eyes:  2.5% Phenylephrine, 1.0% Tropicamide @ 3:24 PM            Slit Lamp and Fundus Exam    External Exam     Right Left    External Normal Normal      Slit Lamp Exam     Right Left    Lids/Lashes Normal Normal    Conjunctiva/Sclera White and quiet White and quiet    Cornea Clear Clear    Anterior Chamber Deep and quiet Deep and quiet    Iris Round and reactive Round and reactive    Lens 1+ Nuclear sclerosis Posterior chamber intraocular lens; 3+ PCO    Vitreous Posterior vitreous detachment clear      Fundus Exam     Right Left    Disc Normal Normal    C/D Ratio 0.3 0.5    Macula Normal SR band, resolved ME    Vessels Normal attenuated    Periphery Lattice ST and IT, no tears inf 180 Retx; attached      Edited by: Johnell Comings, MD                        No annotated  images are attached to the encounter.         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

## 2013-12-11 ENCOUNTER — Telehealth: Payer: Self-pay | Admitting: Urology

## 2013-12-11 ENCOUNTER — Ambulatory Visit
Admit: 2013-12-11 | Discharge: 2013-12-11 | Disposition: A | Discharge status: Home / Self Care | Payer: Self-pay | Source: Ambulatory Visit | Attending: Urology | Admitting: Urology

## 2013-12-11 DIAGNOSIS — N390 Urinary tract infection, site not specified: Secondary | ICD-10-CM

## 2013-12-11 NOTE — Telephone Encounter (Signed)
Veronica Bishop  Received call from the [facility] stating patient was at the lab now and needed an order for urine culture as they were closing as soon as possible.  Order entered.  Thanks

## 2013-12-11 NOTE — Telephone Encounter (Signed)
Veronica Bishop called to request an order for urinalysis to be entered into her chart, she believes she has another UTI. She can be contacted at [phone].

## 2013-12-12 NOTE — Telephone Encounter (Signed)
Order signed yesterday. Cx in process.    Thanks.

## 2013-12-13 ENCOUNTER — Telehealth: Payer: Self-pay | Admitting: Urology

## 2013-12-13 DIAGNOSIS — N390 Urinary tract infection, site not specified: Secondary | ICD-10-CM

## 2013-12-13 LAB — AEROBIC CULTURE

## 2013-12-13 MED ORDER — NITROFURANTOIN MONOHYD MACRO 100 MG PO CAPS *I*
100.0000 mg | ORAL_CAPSULE | Freq: Two times a day (BID) | ORAL | Status: AC
Start: 2013-12-13 — End: 2013-12-20

## 2013-12-13 NOTE — Telephone Encounter (Signed)
Lorne Skeens with [facility] regarding the urine culture results and your message below.   Malasha would like to speak with you regarding IM antibiotic treatment. Please call her at [phone]. She is symptomatic with urgency and pain on urination. She said Macrobid has not eradicated her symptoms since this began in January.    Notes Recorded by Autumn Patty, PA on 12/13/2013 at 1:04 PM  SP Nurse,    Urine culture did grow bacteria but this was a lower colony count.  If she is still symptomatic then I would recommend Macrobid 100 mg by mouth twice a day x7 days.  If she's been feeling better then have her continue increased fluid intake and call if symptoms return.    Thanks.

## 2013-12-13 NOTE — Telephone Encounter (Signed)
SP Staff,    Please set patient up to have retroperitoneal ultrasound.  An order was placed.  She is aware she will be called in the near future.    Thanks.

## 2013-12-13 NOTE — Addendum Note (Signed)
Addended by: Autumn Patty on: 12/13/2013 04:31 PM     Modules accepted: Orders

## 2013-12-13 NOTE — Telephone Encounter (Signed)
Refer to the addendum on the last progress note.

## 2013-12-14 NOTE — Telephone Encounter (Signed)
Authorization Status:  Not required    Type:  Renal Ultrasound    Location:  [facility] (pt request)    Date:  Thursday Dec 28, 2013 (pt out of town next week)    Time:   8:00 AM (pt request)    Blood work Needed:  No     Prep:  no food or drink after midnight the evening prior to the exam    Patient Informed:  Yes    If the patient would like to reschedule imaging, they must call Radiology directly at [phone].

## 2013-12-25 ENCOUNTER — Ambulatory Visit
Admit: 2013-12-25 | Discharge: 2013-12-25 | Disposition: A | Discharge status: Home / Self Care | Payer: Self-pay | Source: Ambulatory Visit | Attending: Urology | Admitting: Urology

## 2013-12-25 DIAGNOSIS — N390 Urinary tract infection, site not specified: Secondary | ICD-10-CM

## 2013-12-27 ENCOUNTER — Telehealth: Payer: Self-pay | Admitting: Urology

## 2013-12-27 ENCOUNTER — Other Ambulatory Visit: Payer: Self-pay | Admitting: Urology

## 2013-12-27 LAB — AEROBIC CULTURE

## 2013-12-27 MED ORDER — CEPHALEXIN 500 MG PO CAPS *I*
500.0000 mg | ORAL_CAPSULE | Freq: Three times a day (TID) | ORAL | Status: AC
Start: 2013-12-27 — End: 2014-01-03

## 2013-12-27 NOTE — Telephone Encounter (Signed)
-----   Message from Autumn Patty, [place] sent at 12/27/2013  1:33 PM EDT -----  SP Nurse,    Please inform the patient that their urine culture was positive.  Cephalexin was faxed to their pharmacy.  Take as directed until completed.  Keep the ultrasound as scheduled.    I recommend making an appointment with Dr. Vernon Prey for cystoscopy after the ultrasound is completed because of the recurrent UTIs.    Thanks.

## 2013-12-27 NOTE — Telephone Encounter (Signed)
Patient calling to advise she received a call from her pharmacy to advise Keflex was sent in for her but she was unaware that she still had an infection. Patient states she has an [place] tomorrow at [employer] at [facility]. She is requesting to check with Linna Hoff if he thinks he would have those results by the time of an appt she requested with him 5/8 at 1:30, if not she will reschedule and would like a call back from Dan due to going out of town this weekend. Please call her back at [phone].

## 2013-12-27 NOTE — Telephone Encounter (Signed)
Stacy returned a call to this patient while she was on the phone with someone else in the [employer]. Please call her back [phone]

## 2013-12-27 NOTE — Telephone Encounter (Signed)
Veronica Bishop,   Patient states that with her IC and the way she is feeling she doesn't think she could do a cysto in the office right now. She states last time she had one in the office it was exteremly painful. She made an appointment to see you Friday (she has the ultrasound tomorrow morning). She is having a lot of discomfort. She is wondering if it is okay to keep the appointment with you Friday. She is going out of town this weekend and is hoping she would feel better.

## 2013-12-27 NOTE — Telephone Encounter (Signed)
LMOM for patient to call the SP office. SG/LPN

## 2013-12-27 NOTE — Telephone Encounter (Signed)
Stacy,    I would expect her symptoms to improve after starting antibiotics.  She has a recurrent infection based on her most recent culture and is likely why she is not feeling better.    I could certainly see her again on Friday and go over the ultrasound results.  Would also discuss moving forward with further evaluation with possible cystoscopy.    Thanks.

## 2013-12-28 ENCOUNTER — Encounter: Payer: Self-pay | Admitting: Primary Care

## 2013-12-28 ENCOUNTER — Ambulatory Visit
Admit: 2013-12-28 | Discharge: 2013-12-28 | Disposition: A | Discharge status: Home / Self Care | Payer: Self-pay | Source: Ambulatory Visit | Attending: Urology | Admitting: Urology

## 2013-12-28 DIAGNOSIS — K7581 Nonalcoholic steatohepatitis (NASH): Secondary | ICD-10-CM | POA: Insufficient documentation

## 2013-12-29 ENCOUNTER — Encounter: Payer: Self-pay | Admitting: Urology

## 2013-12-29 ENCOUNTER — Ambulatory Visit: Payer: Self-pay | Admitting: Urology

## 2013-12-29 VITALS — BP 126/82 | HR 80 | Ht 62.0 in | Wt 185.0 lb

## 2013-12-29 DIAGNOSIS — Z1389 Encounter for screening for other disorder: Secondary | ICD-10-CM

## 2013-12-29 LAB — POCT URINALYSIS DIPSTICK
Blood,UA POCT: NEGATIVE
Glucose,UA POCT: NORMAL
Ketones,UA POCT: NEGATIVE
Leuk Esterase,UA POCT: NEGATIVE
Lot #: 23068903
Nitrite,UA POCT: NEGATIVE
PH,UA POCT: 6 (ref 5–8)
Protein,UA POCT: NEGATIVE mg/dL
Specific gravity,UA POCT: 1.01 (ref 1.002–1.03)

## 2013-12-29 MED ORDER — ALPRAZOLAM 1 MG PO TABS *I*
ORAL_TABLET | ORAL | Status: DC
Start: 2013-12-29 — End: 2014-01-01

## 2013-12-29 MED ORDER — PHENAZOPYRIDINE HCL 200 MG PO TABS *I*
200.0000 mg | ORAL_TABLET | Freq: Three times a day (TID) | ORAL | Status: DC | PRN
Start: 2013-12-29 — End: 2014-09-25

## 2013-12-29 MED ORDER — CEPHALEXIN 500 MG PO CAPS *I*
ORAL_CAPSULE | ORAL | Status: DC
Start: 2013-12-29 — End: 2014-09-25

## 2013-12-29 NOTE — Patient Instructions (Signed)
Finish Cephalexin as previously prescribed.    Begin Cephalexin 500 mg 1 tablet with intercourse once the above is completed.    Someone needs to drive you to your next appt if you take Xanax.      PREVENTING URINARY TRACT INFECTIONS     Stay well hydrated so that you are urinating a good volume every few hours during the day.   If sexually active, urinate after intercourse.   Daily genital hygiene. Wash with soap and water using a wash cloth.   Do this more often if you are incontinent of urine or stool.    Do not use antibacterial soaps or wipes around the genitals.   Correct constipation if present.   Daily probiotic Yogurt such as Activia, [place] Yogurt, or Marathon Oil.   Vitamin C 250 mg (over-the-counter) 2-3 times a day, with food.   Consider Ester-C.   Cranberry pills (over-the-counter) as directed on the label. Do not take if you are on Coumadin or Warfarin.        DIETARY/ BEHAVIORAL RECOMMENDATIONS TO HELP MAINTAIN URINARY TRACT HEALTH    PROPER FLUID INTAKE   Drink approximately 8-10 cups of fluid per day (1 cup = 8 oz).    Try to get approximately 16 ounces within the first 30 minutes you are awake.   Any non-caffeinated/non-alcoholic fluid is OK  (water, juice, milk, lemonade, etc.)   However, some acidic beverages and foods can also be irritants.   Helps dilute urine to ease irritation, improving pain, urgency and spasms.   Helps treat and prevent urinary tract infections.   Improve flow of your urine stream.    Avoid Caffeine and Alcohol   Caffeine is commonly found in coffee, tea, soda and chocolate.   Alcoholic beverages include beer, wine, and liquor.   Caffeine and alcohol are both diuretics (making the kidney product more urine) and stimulants to the bladder.  For men they are irritants to the prostate as well.   Even decaffeinated coffee, tea, sodas, and artifical sweeteners can act as irritants.    Bland Diet   Foods that are spicy or acidic may also cause bladder  irritation.   There are many other foods that could irritate the bladder. If you keep a diary of when you have a irritative urinary symptoms and also write down what you have had to ear or drink during the previous couple of meals, you may find a correlation.    Avoid late evening fluid intake   The more you drink before bed the more likely you will need to urinate at night.   Generally, 2 hours before bed is enough time,  Sips between then are OK.    Timed urinating   Try to urinate about every 2-3 hrs during the day.  This can help avoid urgency and incontinence by keeping lower bladder volumes.   This will also help in the case of urinary retention.

## 2013-12-29 NOTE — Progress Notes (Signed)
Interim visit  PriorDiagnosis(es) or CC: UTIs, OAB, IC, Elevated PVR    HPI:   UTIs  Have become more recurrent lately.  She does notice some association with intercourse.  Currently taking Cephalexin for her most recent UTI.  Her UTIs have been symptomatic.  Report she may want to consider urethral dilation again as she feels that this is beneficial in the past.  She completed retroperitoneal ultrasound was requested.  She's requesting premedication prior to cystoscopy due to previous poor tolerance.   OAB/IC  She is on Myrbertriq 50 at present.   Reports this has made a difference.  She is also on Tamsulosin.      Objective:    PHYSICAL EXAM:  Generally healthy appearing. No apparent distress.  Pleasant, with an appropriate mood and affect.  Alert and oriented x3. No gross cranial nerve deficits.  Normal skin color without diaphoresis.  No visible JVD.  Pulse is regular.  Respirations unlabored.  Abdomen soft, nondistended, and without masses nor organomegaly.   Generalized lower abdominal discomfort without rebound or referred pain.  No palpable hernias present.  No CVAT or paraspinal muscle tenderness.   No inguinal lymphadenopathy.  No lower extremity edema.  Appropriate strength in both upper and lower extremities.    Recent Results (from the past 24 hour(s))   POCT URINALYSIS DIPSTICK    Collection Time     12/29/13  3:39 PM       Result Value Range    Specific gravity,UA POCT 1.010  1.002 - 1.03    PH,UA POCT 6.0  5 - 8    Leuk Esterase,UA POCT Negative  Negative    Nitrite,UA POCT Negative  Negative    Protein,UA POCT Negative  Negative mg/dL    Glucose,UA POCT Normal  Normal    Ketones,UA POCT Negative  Negative    Urobilinogen,UA        Bilirubin,Ur    Negative    Blood,UA POCT Negative  Negative    Exp date 09/2014      Lot # [id number]     (took Uristat this morning)       Ref. Range 11/01/2013 18:57   RBC,UA Latest Range: 0-2 /hpf 8 (A)   WBC,UA Latest Range: 0-5 /hpf 149 (H)   Bacteria,UA No range found  4+ (A)   Squam Epithel,UA Latest Range: 0-1+  3+ (A)   Renal Epithel,UA Latest Range: NONE  1+ (A)     Prior Urine Cultures   12/25/13:  >100 K./mL E. coli   12/11/13:  40 K./mL E. coli   11/20/13:  20 K./mL E. coli   11/06/13:  >100 K./mL E. coli   10/17/13:  >100 K./mL E. Coli.   09/20/13:  >100 K./mL E. Coli.    Prior Imaging Studies  12/28/2013 RENAL ULTRASOUND  ORDERING CLINICAL INFORMATION: ERECORD: Recurrent UTI`s  IMPRESSION:  No evidence of hydronephrosis or focal lesions. Incidentally noted Hepatic steatosis.      PVR:   Prior visit values:    42 ccs    CF 102 ccs      ASSESSMENT:   UTIs    E. Coli have shown resistance to ampicillin and Cipro.  She is allergic to sulfa.    Intermediate resistance to Macrobid.    Unremarkable retroperitoneal ultrasound May 2015.   OAB/IC    Improvement with Myrbetriq and Tamsulosin.    She cannot go back on Detrol   Elevated PVR    Much improved  PLAN:  Finish cephalexin as previously prescribed.  Begin post coital cephalexin 500 mg.  Prescription sent to her pharmacy.  Perineum 200 mg every 8 hours when necessary.    Proper fluid intake and UTI prevention reviewed.    Continue Myrbetriq 50 mg and Tamsulosin.    FOLLOW-UP:   Cystoscopy (recurrent UTI) with Dr. Vernon Prey.   Xanax 1 mg one hour prior to Cystoscopy. She'll have someone drive her to the appointment.   Patient inquiring about possible urethral dilation given history.    Greater than 25 minutes was spent in the evaluation and management today.  Over 75% of the time was spent counseling and coordinating the care for the above diagnoses, as listed above in the assessment and plan.    The appointment was concluded after confirming with the patient that they fully understand the above treatment plan and denied having any further questions or concerns.  The patient was instructed to call if any issues or concerns develop prior to their next appointment.     Barista may have been  used.  To expedite communication, some inadvertent typographical and transcription errors generated by the transcription software may have been missed despite a reasonable effort to identify and correct them.

## 2014-01-01 ENCOUNTER — Other Ambulatory Visit: Payer: Self-pay | Admitting: Urology

## 2014-01-01 MED ORDER — ALPRAZOLAM 1 MG PO TABS *I*
ORAL_TABLET | ORAL | Status: DC
Start: 2014-01-01 — End: 2014-09-25

## 2014-01-01 NOTE — Telephone Encounter (Signed)
Veronica Bishop/[facility] calling because there is no Max Daily Dose listed on the script for patient's Xanax. The script is for just 1 tablet, but it needs the MDD specified per patient's insurance. Veronica Bishop is requesting the script be resent with the MDD listed.

## 2014-01-03 ENCOUNTER — Telehealth: Payer: Self-pay | Admitting: Urology

## 2014-01-03 NOTE — Telephone Encounter (Signed)
Tiffany from [facility] calling because there is no specified frequency for the cephalexin (KEFLEX) 500 MG capsule, the order only says take with intercourse. Please call her back to clarify the frequency. Patient was hoping to pick this prescription up sometime this morning.Tiffany @ [phone]

## 2014-01-03 NOTE — Telephone Encounter (Signed)
Pharmacy called and verified prescription information below.

## 2014-01-08 ENCOUNTER — Encounter: Payer: Self-pay | Admitting: Urology

## 2014-01-08 ENCOUNTER — Ambulatory Visit: Payer: Self-pay | Admitting: Urology

## 2014-01-08 VITALS — BP 135/74 | Ht 62.0 in | Wt 183.0 lb

## 2014-01-08 DIAGNOSIS — N3281 Overactive bladder: Secondary | ICD-10-CM

## 2014-01-08 DIAGNOSIS — N390 Urinary tract infection, site not specified: Secondary | ICD-10-CM

## 2014-01-08 DIAGNOSIS — Z1389 Encounter for screening for other disorder: Secondary | ICD-10-CM

## 2014-01-08 DIAGNOSIS — R339 Retention of urine, unspecified: Secondary | ICD-10-CM

## 2014-01-08 DIAGNOSIS — R300 Dysuria: Secondary | ICD-10-CM

## 2014-01-08 DIAGNOSIS — N3010 Interstitial cystitis (chronic) without hematuria: Secondary | ICD-10-CM

## 2014-01-08 LAB — POCT URINALYSIS DIPSTICK
Blood,UA POCT: NEGATIVE
Glucose,UA POCT: NORMAL
Ketones,UA POCT: NEGATIVE
Leuk Esterase,UA POCT: NEGATIVE
Lot #: 23068903
Nitrite,UA POCT: NEGATIVE
PH,UA POCT: 5 (ref 5–8)
Protein,UA POCT: NEGATIVE mg/dL
Specific gravity,UA POCT: 1.015 (ref 1.002–1.03)

## 2014-01-08 MED ORDER — TOLTERODINE TARTRATE 4 MG PO CP24 *I*
4.0000 mg | ORAL_CAPSULE | Freq: Every day | ORAL | Status: DC
Start: 2014-01-08 — End: 2015-02-19

## 2014-01-08 MED ORDER — GENTAMICIN SULFATE 40 MG/ML IJ SOLN *I*
80.0000 mg | Freq: Once | INTRAMUSCULAR | Status: AC
Start: 2014-01-08 — End: 2014-01-08
  Administered 2014-01-08: 80 mg via INTRAMUSCULAR

## 2014-01-08 NOTE — Progress Notes (Signed)
Visit Diagnosis(es) or CC: UTIs, OAB, IC, Elevated PVR    HPI:  The patient comes to the office today in follow up for the above diagnosis (-es).  She took Xanax today, anticipating she might require cystoscopy, so her daughter accompanied her.     OAB/IC  She is on Myrbertriq 25 at present.  She feels the Detrol worked better, but again her Market researcher cover it.    She is also on tamsulosin.   She finds this of critical importance for her symptoms.  "That one really helps."  She'd like at this point to try going back to Detrol and "see what happens with insurance."  It is paradoxical that a brand name medication should be preferred over a generic.      UTIs  She is in today reporting virtually continuous infections since her last visit.  "I've had an infection since January."  Infections have been with E-Coli, all with the same resistance pattern.  This suggests persistence rather than reinfection.  She is just coming off antibiotics now.    She was advised to complete a survey of her urinary system.  She did undergo a recent renal ultrasound, with normal findings - see below.  I have advised cystoscopy to her today.  She agrees.  She is requesting urethral dilation as well, "becuase I have done better after you've done that."    PMH:    No changes reported    PSH:  No changes reported    FH:   No new cancer diagnoses      SH:  No changes reported    Review of Systems:    Systemic: Denies recent weight loss, weight gain, fatigue, fever, chills.  Neuro: Denies headache, dizziness.  Heart:: Denies chest pain or palpitations.  Respiratory: Denies shortness of breath.  G.I.: Denies change in bowel habits, blood in stool, reflux.  GU: Denies urogenital complaints, apart from what is documented in the HPI.  Musculoskeletal: Denies new muscle or joint pain.        Objective:    GENERAL:  No acute distress.  Well developed, well nourished.  NEUROLOGIC:  Oriented to person, place, time, and situation  PSYCHIATRIC:   Normal mood and affect  HEENT:  NC/AT.  Sclerae anicteric.  Nose/mouth/ears without obvious lesions.  NECK:  Trachea midline.  No JVD in sitting position.    SKIN:  Normal color, turgor, texture, hydration  LUNGS:  Respirations unlabored.    HEART:  Regular rate and rhythm    BACK/ORTHO:  No CVA tenderness.  No obvious back deformities.  No tenderness of the axial skeleton.    ABDOMEN:  Soft, nontender, nondistended, and without masses.  No evidence of bladder distention.  No inguinal hernias.  GENITALIA:   FEMALE GENITOURINARY EXAMINATION:     External genitalia: Anatomically normal, with normal labia minora and majora.  Urethral Meatus:  Visibly normal without discharge or caruncle.  Position normal.  Urethra and Bladder Neck: Palpably normal, without masses, induration, or evidence of diverticulum.    Bimanual Pelvic Examination:  No suspicious masses, tenderness, or discharge.  No pelvic organ prolapse or cervical motion tenderness.         PVR:   Not assessed today    CF 42 ccs    CF 102 ccs    Renal Ultrasound:   12-28-13:  IMPRESSION:  No evidence of hydronephrosis or focal lesions. Incidentally noted   Hepatic steatosis.  END OF IMPRESSION.    Culture History:  Results for Veronica Bishop, Veronica Bishop (MRN [id number]) as of 01/09/2014 04:08   Ref. Range 10/17/2013 07:20 11/06/2013 20:48 11/20/2013 08:24 12/11/2013 19:24 12/25/2013 16:39   Aerobic Culture No range found Escherichia coli Escherichia coli Escherichia coli Escherichia coli Escherichia coli         PROCEDURE:    Procedure:  Cystourethroscopy  Indication(s) Veronica Bishop with an X)   1.  Assess microhematuria   2.  Assess gross hematuria   3.  Follow up of transitional cell carcinoma   4.  Assess for BPH/LUTS   5.  Assess for possible urethral stricture   6.  Assess for OAB symptoms.   7.  Assess for abnormal urine cytology   8.  Follow up for abnormal CT scan findings (specify)    Bladder Stone    Thick walled bladder    Localized bladder wall irregularity (mass)    Other:      9.  Other:  X - UTIs     Description of Procedure:   The patient was brought into the procedure suite and positioned on the table in the dorsal lithotomy position.       Her genitalia were prepped with betadine.   Her urethra was dilated to 75F using VBSs.   The flexible cystoscope was passed per urethra and advanced into the urinary bladder.     Findings:     Anterior Urethra:  Normal, without evidence of narrowing or stricture disease.    No evidence of urethral diverticula.   Bladder Neck:  No evidence of a bladder neck contracture or other abnormality   Bladder:  Ureteral orifices in their normal anatomic positions on the trigone.    Appearance of ureteral orifices normal.  Clear efflux noted from each side.       Urothelium:    No evidence of TCC    No other urothelial lesions found.      0-1+ trabeculation noted.         No stones found.   Bladder Capacity:  Normal       Post-Procedure:     The patient tolerated the procedure well.  The patient voided after the procedure with clear urine.      The patient was advised to:    1.  Be certain they have emptied their bladder prior to leaving the office.    2.  Push fluids for the balance of the day.    3.  Expect blood in the urine for 24 - 48 hours.    4.  Expect mild dysuria for the next 24 - 48 hours    5.  Call the office for persistent dysuria, fever, chills, or back pain.             ASSESSMENT:   UTIs    Persistent rather than recurrent    There are no identified causative factors present    The infections are resistant to Cipro    IM gentamicin given today (80 mgs)    Will continue at home for the next 4 days   OAB/IC    Less well controlled with Myrbetriq 25    She cannot go back on Detrol, but she wants an Rx sent in   Elevated PVR    Good in the past  PLAN:   Trial of obtaining Detrol   Check urine culture today   Gentamicin x 1 in office today (80 mgs)   Gentamicin x 4 more days at home (80 mgs each)  The patient and her daughter were taught the  technique of IM injections.      FOLLOW-UP:   One month    LOS:      [id number], [id number]    Time spent in counseling and coordination of care (if used for LOS):  15 minute face-to-face visit, beyond cystoscopy, with >75% of that spent in counseling and coordination of care.

## 2014-01-09 ENCOUNTER — Encounter: Payer: Self-pay | Admitting: Urology

## 2014-01-09 DIAGNOSIS — R300 Dysuria: Secondary | ICD-10-CM | POA: Insufficient documentation

## 2014-01-09 LAB — AEROBIC CULTURE: Aerobic Culture: 0

## 2014-02-12 ENCOUNTER — Encounter: Payer: Self-pay | Admitting: Urology

## 2014-02-12 ENCOUNTER — Ambulatory Visit: Payer: Self-pay | Admitting: Urology

## 2014-02-12 VITALS — BP 138/74 | HR 75 | Ht 62.0 in | Wt 183.0 lb

## 2014-02-12 DIAGNOSIS — R339 Retention of urine, unspecified: Secondary | ICD-10-CM

## 2014-02-12 DIAGNOSIS — Z1389 Encounter for screening for other disorder: Secondary | ICD-10-CM

## 2014-02-12 DIAGNOSIS — N3281 Overactive bladder: Secondary | ICD-10-CM

## 2014-02-12 DIAGNOSIS — N3010 Interstitial cystitis (chronic) without hematuria: Secondary | ICD-10-CM

## 2014-02-12 DIAGNOSIS — R300 Dysuria: Secondary | ICD-10-CM

## 2014-02-12 DIAGNOSIS — N390 Urinary tract infection, site not specified: Secondary | ICD-10-CM

## 2014-02-12 LAB — POCT URINALYSIS DIPSTICK
Blood,UA POCT: NEGATIVE
Glucose,UA POCT: NORMAL
Ketones,UA POCT: NEGATIVE
Leuk Esterase,UA POCT: NEGATIVE
Lot #: 23302001
Nitrite,UA POCT: NEGATIVE
PH,UA POCT: 5 (ref 5–8)
Protein,UA POCT: NEGATIVE mg/dL
Specific gravity,UA POCT: 1.02 (ref 1.002–1.03)

## 2014-02-12 MED ORDER — TOLTERODINE TARTRATE 4 MG PO CP24 *I*
4.0000 mg | ORAL_CAPSULE | Freq: Every day | ORAL | Status: DC
Start: 2014-02-12 — End: 2015-02-19

## 2014-02-12 NOTE — Progress Notes (Signed)
Visit Diagnosis(es) or CC: UTIs, OAB, IC, Elevated PVR    HPI:  The patient comes to the office today in follow up for the above diagnosis (-es).  She took Xanax today, anticipating she might require cystoscopy, so her daughter accompanied her.     OAB/IC  She is on Detrol LA 4 once daily at this point.  It is working "very well."  It was much more effective for her than Myrbetriq.      UTIs  She finished IM gentamicin on 01-12-14.  She feels "so much better."  I think that really knocked the infection out of me.  She is using keflex just after intercourse only.    She is content with that.    Cystoscopy on 01-08-14 was normal.      PMH:    No changes reported         Objective:    GENERAL:  No acute distress.  Well developed, well nourished.  NEUROLOGIC:  Oriented to person, place, time, and situation  PSYCHIATRIC:  Normal mood and affect  HEENT:  NC/AT.  Sclerae anicteric.  Nose/mouth/ears without obvious lesions.  NECK:  Trachea midline.  No JVD in sitting position.    SKIN:  Normal color, turgor, texture, hydration  LUNGS:  Respirations unlabored.    HEART:  Regular rate and rhythm    BACK/ORTHO:  No CVA tenderness.  No obvious back deformities.  No tenderness of the axial skeleton.    ABDOMEN:  Soft, nontender, nondistended, and without masses.  No evidence of bladder distention.  No inguinal hernias.           PVR:   150 ccs    Not assessed today    CF 42 ccs    CF 102 ccs    Renal Ultrasound:   12-28-13:  IMPRESSION:  No evidence of hydronephrosis or focal lesions. Incidentally noted   Hepatic steatosis.  END OF IMPRESSION.    Culture History:  Results for CHRISTIANE, SISTARE (MRN [id number]) as of 01/09/2014 04:08   Ref. Range 10/17/2013 07:20 11/06/2013 20:48 11/20/2013 08:24 12/11/2013 19:24 12/25/2013 16:39   Aerobic Culture No range found Escherichia coli Escherichia coli Escherichia coli Escherichia coli Escherichia coli                     ASSESSMENT:   UTIs    Symptoms cleared    Needs a follow up  culture   OAB/IC    Well compensated at present with Detrol LA4   Elevated PVR    Elevated today - unclear significance.        PLAN:   Check urine culture   New 57-month Rx for Detrol        FOLLOW-UP:   Six months    LOS:      [id number], [phone]    Time spent in counseling and coordination of care (if used for LOS):  15 minute face-to-face visit, beyond cystoscopy, with >75% of that spent in counseling and coordination of care.

## 2014-02-13 LAB — AEROBIC CULTURE: Aerobic Culture: 0

## 2014-02-17 ENCOUNTER — Encounter: Payer: Self-pay | Admitting: Urology

## 2014-04-16 ENCOUNTER — Encounter: Payer: Self-pay | Admitting: Primary Care

## 2014-05-20 ENCOUNTER — Encounter: Payer: Self-pay | Admitting: Retina Specialist

## 2014-05-20 DIAGNOSIS — H35352 Cystoid macular degeneration, left eye: Secondary | ICD-10-CM

## 2014-05-20 DIAGNOSIS — H33311 Horseshoe tear of retina without detachment, right eye: Secondary | ICD-10-CM | POA: Insufficient documentation

## 2014-05-20 HISTORY — PX: OTHER SURGICAL HISTORY: SHX169

## 2014-05-20 NOTE — Procedures (Signed)
Procedure Note    Patient: Veronica Bishop  DOB: 1956/05/13  MRN: [id number]    Procedure date: 05/20/2014    Surgeon: Rolla Plate, MD  Anesthesia: Topical  Complications: None    Pre-op diagnosis: Horseshoe tear right eye  Post-op diagnosis: same  Procedure: Argon green laser demarcation laser for retinal detachment prophylaxis right eye    Prior to the procedure, appropriate consent was obtained.    The patient was brought to the argon laser room. The patient was identified and the correct eye was verified.     Proparacaine drops were placed on the eye for topical anesthesia.  A peripheral fundus lens was placed on the eye with lubricant eye gel.    The laser parameters for the procedure were:    Color: Green  Duration: 0.05 s  Power: 280 mW  Spot Size: 200 uM   Number: [id number]  Location: superotemporal around 11 O clock position around the horseshoe tear    The procedure was well tolerated and there were no complications.    Authored by Rolla Plate, MD on 05/20/2014 at 3:32 PM

## 2014-05-21 ENCOUNTER — Ambulatory Visit: Payer: Self-pay | Admitting: Ophthalmology

## 2014-05-21 ENCOUNTER — Encounter: Payer: Self-pay | Admitting: Ophthalmology

## 2014-05-21 VITALS — BP 117/76 | HR 67

## 2014-05-21 DIAGNOSIS — H4310 Vitreous hemorrhage, unspecified eye: Secondary | ICD-10-CM

## 2014-05-21 DIAGNOSIS — H33311 Horseshoe tear of retina without detachment, right eye: Secondary | ICD-10-CM

## 2014-05-21 DIAGNOSIS — H4311 Vitreous hemorrhage, right eye: Secondary | ICD-10-CM

## 2014-05-21 DIAGNOSIS — H43811 Vitreous degeneration, right eye: Secondary | ICD-10-CM

## 2014-05-21 HISTORY — DX: Vitreous degeneration, right eye: H43.811

## 2014-05-21 HISTORY — DX: Vitreous hemorrhage, unspecified eye: H43.10

## 2014-05-21 NOTE — Progress Notes (Signed)
1. PVD OD with HST x 2 and VH. One tear temporally was lasered yesterday. I saw a second tear and lasered it today.  2. H/o RD OS, now limited vision OS. Functionally monocular.    Laser completed, but for precaution sake, I suggest Dr. Ronie Spies re-examine in a week (Friday OK).    Return or call for any concerns.

## 2014-05-21 NOTE — Procedures (Signed)
Barrier laser OD: #300/0.1/200/200 yellow bio and superquad. No compls

## 2014-05-22 ENCOUNTER — Ambulatory Visit: Payer: Self-pay | Admitting: Retina Ophthalmology

## 2014-05-22 ENCOUNTER — Telehealth: Payer: Self-pay | Admitting: Retina Ophthalmology

## 2014-05-22 NOTE — Telephone Encounter (Signed)
LM for pt at home # and cell #.  Per Dr. Nyra Jabs, pt does not need to be seen today.  Pt needs to come in on Friday instead.  I asked pt to call our office so we can RS appt.

## 2014-05-23 ENCOUNTER — Telehealth: Payer: Self-pay | Admitting: Retina Ophthalmology

## 2014-05-23 NOTE — Telephone Encounter (Signed)
I spoke with pt.  Pt states that the blood is just a little streak that is running out of her eye.  Pt states she's had blood previously with a tear.  She wonders if it could be a new tear or from the laser tx Monday.  I told pt I'd check with Dr. Nyra Jabs and call her back.  I also scheduled pt's appt for Friday.  She states she had scheduled it for 1:30pm when she was in on Monday.  Appt was scheduled on the wrong day, so I corrected that.

## 2014-05-23 NOTE — Telephone Encounter (Signed)
Dr. Nyra Jabs spoke with the pt and is having her come in tmrw at 7:45am.

## 2014-05-23 NOTE — Telephone Encounter (Signed)
Pt states that in OD she is seeing new blood and wants to know if it's from the repairs done or if she should be seen.

## 2014-05-24 ENCOUNTER — Ambulatory Visit: Payer: Self-pay | Admitting: Retina Ophthalmology

## 2014-05-24 ENCOUNTER — Encounter: Payer: Self-pay | Admitting: Retina Ophthalmology

## 2014-05-24 VITALS — Ht 62.0 in | Wt 183.0 lb

## 2014-05-24 DIAGNOSIS — H33311 Horseshoe tear of retina without detachment, right eye: Secondary | ICD-10-CM

## 2014-05-24 NOTE — Assessment & Plan Note (Signed)
PVD with VH and retinal tears  --s/p barrier LPC OD (05/20/14)  --s/p barrier LPC OD (05/21/14)  --s/p barrier LPC OD today (05/24/14) to one new HST at 10:00 and two spots of lattice at 9:00 and 6:30  --procedure: Laser Retinopexy--#352, size 500 micron, 0.1s, 200-282mW, yellow with Superquad lens  --SSX of RD explained and understood  --f/u 05/29/14    Chronic pseudophakic ME OS  HX OF RD repair X 3 (3/11), now resolved  Following yearly now    Hx RD repair OS  RD repair X 3 (3/11)  See above    PCIOL OS  -PCO OS, follow for now    Lattice OD  - no tears, SSx of RD explained and understood

## 2014-05-24 NOTE — Progress Notes (Signed)
Subjective:   Subjective10/08/2013   Chief Complaint   Patient presents with    Post-op     HPI     4 day s/p post op s/p barrier laser to HST x 2 OD (05/20/14 and 05/21/14))   --Monocular pt  Noted new "stream of blood" OD last night  Hx: chronic pseudophakic ME OS; Hx of RD repair x3 (March 2011)  Hx: PVD and Lattice OD    Pt states vision is blurry. She states she knows her eye is bleeding.        Current Outpatient Rx   Name  Route  Sig  Dispense  Refill    tolterodine (DETROL LA) 4 MG 24 hr capsule    Oral    Take 1 capsule (4 mg total) by mouth daily   Swallow whole. Do not crush or chew.    90 capsule    3      tolterodine (DETROL LA) 4 MG 24 hr capsule    Oral    Take 1 capsule (4 mg total) by mouth daily   Swallow whole. Do not crush or chew.    30 capsule    12      ALPRAZolam (XANAX) 1 MG tablet        Take 1 tablet approximately 1 hour before your Cystoscopy. Have someone drive you to and from the appointment.MDD1    1 tablet    0      phenazopyridine (PYRIDIUM) 200 MG tablet    Oral    Take 1 tablet (200 mg total) by mouth 3 times daily as needed for Pain    30 tablet    3       Created by Conversion      cephalexin (KEFLEX) 500 MG capsule        1 PO with intercourse.    15 capsule    3      mirabegron (MYRBETRIQ) 50 MG 24 hr tablet    Oral    Take 1 tablet (50 mg total) by mouth daily    90 tablet    3      tamsulosin (FLOMAX) 0.4 MG        TAKE 1 CAPSULE (0.4 MG TOTAL) BY MOUTH EVERY EVENING    90 capsule    2      naproxen sodium (ANAPROX) 220 MG tablet    Oral    Take 220 mg by mouth daily              aspirin 81 MG tablet    Oral    Take 81 mg by mouth daily              EPINEPHrine (EPIPEN) 0.3 MG/0.3ML SOAJ injection        INJECT 0.3ML INTRAMUSCULARLY AS DIRECTED    1 Device    5       Created by Conversion        Sulfa drugs and No known latex allergy   No birth history on file.  Past Medical History   Diagnosis Date    Macular edema      PERSISITENT PSEUDOPHAKIC OS    Cataract      Refractive error 03/27/2011    Chronic kidney disease     Varicella     IC (interstitial cystitis)     Vitreous hemorrhage 05/21/2014    PVD (posterior vitreous detachment), right 05/21/2014      Past Surgical History  Procedure Laterality Date    Hx tonsillectomy/adenoidectomy       Tonsillectomy Conversion Data     Cesarean section, classic       Cesarean Section Conversion Data     Bunionectomy       Hallux Valgus (Bunion) Correction Conversion Data     Cataract removal  10/24/09     PC IOL OS    Retinal detachment surgery       X 3    25g ppv/ivt/afx/el/22%sf6 os  05/16/09    Sb/20g ppv/mo/pfc/rtx/afx/15% c3 f8 os  06/20/09    Barrier lpc os  07/30/09    Phaco/iol/25g ppv/pcox/mp/afx/icg/ilmx/14% c3 f8 os  10/24/09    Intra vitreal triesence os  05/30/10     4MG     Avastin #1 os 08/14/11 consent 08/14/11 maxitrol      Barrier laser, right eye  05/20/14     Dr Denyce Robert      History   Smoking status    Former Smoker    Types: Cigarettes    Quit date: 02/15/1981   Smokeless tobacco    Not on file      History   Alcohol Use    Yes      History   Drug Use No      Specialty Problems        Ophthalmology Problems    Cataract        Cystoid macular edema of left eye        Refractive error        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, right        PVD (posterior vitreous detachment), right        Vitreous hemorrhage               ROS     Positive for: Eyes    Negative for: Constitutional, Gastrointestinal, Neurological, Skin,   Genitourinary, Musculoskeletal, HENT, Endocrine, Cardiovascular,   Respiratory, Psychiatric, Allergic/Imm, Heme/Lymph       Objective:   Objective  Filed Vitals:    05/24/14 0748   Height: 1.575 m (5\' 2" )   Weight: 83.008 kg (183 lb)       Base Eye Exam     Visual Acuity (Snellen - Linear)      Right Left   Dist cc 20/20 20/400   Dist ph cc  NI       Correction:  Glasses      Tonometry (Tonopen, 8:00 AM)      Right Left   Pressure 18 17         Pupils      Pupils APD      Right PERRLA None   Left PERRLA +1         Neuro/Psych     Oriented x3:  Yes    Mood/Affect:  Normal      Dilation     Both eyes:  2.5% Phenylephrine, 1.0% Tropicamide @ 8:00 AM            Slit Lamp and Fundus Exam     External Exam      Right Left    External Normal Normal      Slit Lamp Exam      Right Left    Lids/Lashes Normal Normal    Conjunctiva/Sclera White and quiet White and quiet    Cornea Clear Clear    Anterior Chamber Deep and quiet Deep and quiet    Iris Round and  reactive Round and reactive    Lens 1+ Nuclear sclerosis Posterior chamber intraocular lens; 3+ PCO    Vitreous Posterior vitreous detachment; mild VH clear      Fundus Exam      Right Left    Disc Normal Normal    C/D Ratio 0.3 0.5    Macula Normal SR band, resolved ME    Vessels Normal attenuated    Periphery Lattice ST and IT, tear at 11 o clock, Horseshoe tear, Tera with lattice, Lattice, Laser scar, Good laser, New or unrecognized HST sup nasally, VH, New HST with lattice, no tear inf 180 Retx; attached, Laser scar, Macula attached, laser            Refraction     Wearing Rx      Sphere Cylinder Axis Add   Right -0.75 +0.75 142 +2.50   Left +2.50  180 +2.00                                        Assessment/Plan:   Assessment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, right    PVD with VH and retinal tears  --s/p barrier LPC OD (05/20/14)  --s/p barrier LPC OD (05/21/14)  --s/p barrier LPC OD today (05/24/14) to one new HST at 10:00 and two spots of lattice at 9:00 and 6:30  --procedure: Laser Retinopexy--#352, size 500 micron, 0.1s, 200-219mW, yellow with Superquad lens  --SSX of RD explained and understood  --f/u 05/29/14    Chronic pseudophakic ME OS  HX OF RD repair X 3 (3/11), now resolved  Following yearly now    Hx RD repair OS  RD repair X 3 (3/11)  See above    PCIOL OS  -PCO OS, follow for now    Lattice OD  - no tears, SSx of RD explained and understood

## 2014-05-25 ENCOUNTER — Ambulatory Visit: Payer: Self-pay | Admitting: Retina Ophthalmology

## 2014-05-25 DIAGNOSIS — H33311 Horseshoe tear of retina without detachment, right eye: Secondary | ICD-10-CM

## 2014-05-25 NOTE — Assessment & Plan Note (Signed)
PVD with VH and retinal tears  --s/p barrier LPC OD (05/20/14)  --s/p barrier LPC OD (05/21/14)  --s/p barrier LPC OD (05/24/14) -- new VH today from bridging vessels at 1:30 across tear that has been lasered  --SSX of RD explained and understood  --f/u 05/29/14    Chronic pseudophakic ME OS  HX OF RD repair X 3 (3/11), now resolved  Following yearly now    Hx RD repair OS  RD repair X 3 (3/11)  See above    PCIOL OS  -PCO OS, follow for now    Lattice OD  - no tears, SSx of RD explained and understood

## 2014-05-25 NOTE — Progress Notes (Signed)
Subjective:   Subjective10/09/2013   No chief complaint on file.        Current Outpatient Rx   Name  Route  Sig  Dispense  Refill    tolterodine (DETROL LA) 4 MG 24 hr capsule    Oral    Take 1 capsule (4 mg total) by mouth daily   Swallow whole. Do not crush or chew.    90 capsule    3      tolterodine (DETROL LA) 4 MG 24 hr capsule    Oral    Take 1 capsule (4 mg total) by mouth daily   Swallow whole. Do not crush or chew.    30 capsule    12      ALPRAZolam (XANAX) 1 MG tablet        Take 1 tablet approximately 1 hour before your Cystoscopy. Have someone drive you to and from the appointment.MDD1    1 tablet    0      phenazopyridine (PYRIDIUM) 200 MG tablet    Oral    Take 1 tablet (200 mg total) by mouth 3 times daily as needed for Pain    30 tablet    3       Created by Conversion      cephalexin (KEFLEX) 500 MG capsule        1 PO with intercourse.    15 capsule    3      mirabegron (MYRBETRIQ) 50 MG 24 hr tablet    Oral    Take 1 tablet (50 mg total) by mouth daily    90 tablet    3      tamsulosin (FLOMAX) 0.4 MG        TAKE 1 CAPSULE (0.4 MG TOTAL) BY MOUTH EVERY EVENING    90 capsule    2      naproxen sodium (ANAPROX) 220 MG tablet    Oral    Take 220 mg by mouth daily              aspirin 81 MG tablet    Oral    Take 81 mg by mouth daily              EPINEPHrine (EPIPEN) 0.3 MG/0.3ML SOAJ injection        INJECT 0.3ML INTRAMUSCULARLY AS DIRECTED    1 Device    5       Created by Conversion        Sulfa drugs and No known latex allergy   No birth history on file.  Past Medical History   Diagnosis Date    Macular edema      PERSISITENT PSEUDOPHAKIC OS    Cataract     Refractive error 03/27/2011    Chronic kidney disease     Varicella     IC (interstitial cystitis)     Vitreous hemorrhage 05/21/2014    PVD (posterior vitreous detachment), right 05/21/2014      Past Surgical History   Procedure Laterality Date    Hx tonsillectomy/adenoidectomy       Tonsillectomy Conversion Data     Cesarean  section, classic       Cesarean Section Conversion Data     Bunionectomy       Hallux Valgus (Bunion) Correction Conversion Data     Cataract removal  10/24/09     PC IOL OS    Retinal detachment surgery       X  3    25g ppv/ivt/afx/el/22%sf6 os  05/16/09    Sb/20g ppv/mo/pfc/rtx/afx/15% c3 f8 os  06/20/09    Barrier lpc os  07/30/09    Phaco/iol/25g ppv/pcox/mp/afx/icg/ilmx/14% c3 f8 os  10/24/09    Intra vitreal triesence os  05/30/10     4MG     Avastin #1 os 08/14/11 consent 08/14/11 maxitrol      Barrier laser, right eye  05/20/14     Dr Denyce Robert      History   Smoking status    Former Smoker    Types: Cigarettes    Quit date: 02/15/1981   Smokeless tobacco    Not on file      History   Alcohol Use    Yes      History   Drug Use No      Specialty Problems        Ophthalmology Problems    Cataract        Cystoid macular edema of left eye        Refractive error        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    Base Eye Exam     Visual Acuity (Snellen - Linear)      Right Left   Dist cc 20/25          Neuro/Psych     Oriented x3:  Yes    Mood/Affect:  Normal            Slit Lamp and Fundus Exam     External Exam      Right Left    External Normal Normal      Slit Lamp Exam      Right Left    Lids/Lashes Normal Normal    Conjunctiva/Sclera White and quiet White and quiet    Cornea Clear Clear    Anterior Chamber Deep and quiet Deep and quiet    Iris Round and reactive Round and reactive    Lens 1+ Nuclear sclerosis Posterior chamber intraocular lens; 3+ PCO    Vitreous Posterior vitreous detachment; mild VH clear      Fundus Exam      Right Left    Disc Normal Normal    C/D Ratio 0.3 0.5    Macula Normal SR band, resolved ME    Vessels Normal attenuated    Periphery Lattice ST and IT, tear at 11 o clock, Horseshoe tear, tear with  lattice, Lattice, Laser scar, Good laser, VH, New HST with lattice, no tear, Bridging vessel inf 180 Retx; attached, Laser scar, Macula attached, laser                                     Assessment/Plan:   Assessment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, right    PVD with VH and retinal tears  --s/p barrier LPC OD (05/20/14)  --s/p barrier LPC OD (05/21/14)  --s/p barrier LPC OD (05/24/14) -- new VH today from bridging vessels at 1:30 across tear that has been lasered  --SSX of RD explained and understood  --f/u 05/29/14    Chronic pseudophakic ME OS  HX OF RD repair X 3 (3/11), now resolved  Following yearly now    Hx RD repair OS  RD repair X 3 (3/11)  See above    PCIOL OS  -PCO OS, follow for now    Lattice OD  - no tears, SSx of RD explained and understood

## 2014-05-29 ENCOUNTER — Encounter: Payer: Self-pay | Admitting: Retina Ophthalmology

## 2014-05-29 ENCOUNTER — Ambulatory Visit: Payer: Self-pay | Admitting: Retina Ophthalmology

## 2014-05-29 DIAGNOSIS — H33311 Horseshoe tear of retina without detachment, right eye: Secondary | ICD-10-CM

## 2014-05-29 NOTE — Assessment & Plan Note (Signed)
PVD with VH and retinal tears  --s/p barrier LPC OD (05/20/14)  --s/p barrier LPC OD (05/21/14)  --s/p barrier LPC OD (05/24/14) -- new VH today from bridging vessels at 1:30 across tear that has been lasered  --SSX of RD explained and understood  --f/u 91m    Chronic pseudophakic ME OS  HX OF RD repair X 3 (3/11), now resolved  Following yearly now    Hx RD repair OS  RD repair X 3 (3/11)  See above    PCIOL OS  -PCO OS, follow for now

## 2014-05-29 NOTE — Progress Notes (Signed)
Subjective:   Subjective10/01/2014   Chief Complaint   Patient presents with    Post-op     HPI     4 day f/u  PVD with VH and retinal tears  --s/p barrier LPC OD (05/20/14)  --s/p barrier LPC OD (05/21/14)  --s/p barrier LPC OD (05/24/14)  Chronic pseudophakic ME OS  Hx RD repair OS  --RD repair x3 (3/11)  --see above  PCIOL OS  --PCO OS  Lattice OD    Patient states is noticing "some bleeding" right eye, denies pain, [facility] has   been stable from last visit        Current Outpatient Rx   Name  Route  Sig  Dispense  Refill    tolterodine (DETROL LA) 4 MG 24 hr capsule    Oral    Take 1 capsule (4 mg total) by mouth daily   Swallow whole. Do not crush or chew.    90 capsule    3      tolterodine (DETROL LA) 4 MG 24 hr capsule    Oral    Take 1 capsule (4 mg total) by mouth daily   Swallow whole. Do not crush or chew.    30 capsule    12      ALPRAZolam (XANAX) 1 MG tablet        Take 1 tablet approximately 1 hour before your Cystoscopy. Have someone drive you to and from the appointment.MDD1    1 tablet    0      phenazopyridine (PYRIDIUM) 200 MG tablet    Oral    Take 1 tablet (200 mg total) by mouth 3 times daily as needed for Pain    30 tablet    3       Created by Conversion      cephalexin (KEFLEX) 500 MG capsule        1 PO with intercourse.    15 capsule    3      mirabegron (MYRBETRIQ) 50 MG 24 hr tablet    Oral    Take 1 tablet (50 mg total) by mouth daily    90 tablet    3      tamsulosin (FLOMAX) 0.4 MG        TAKE 1 CAPSULE (0.4 MG TOTAL) BY MOUTH EVERY EVENING    90 capsule    2      naproxen sodium (ANAPROX) 220 MG tablet    Oral    Take 220 mg by mouth daily              aspirin 81 MG tablet    Oral    Take 81 mg by mouth daily              EPINEPHrine (EPIPEN) 0.3 MG/0.3ML SOAJ injection        INJECT 0.3ML INTRAMUSCULARLY AS DIRECTED    1 Device    5       Created by Conversion        Sulfa drugs and No known latex allergy   No birth history on file.  Past Medical History   Diagnosis Date     Macular edema      PERSISITENT PSEUDOPHAKIC OS    Cataract     Refractive error 03/27/2011    Chronic kidney disease     Varicella     IC (interstitial cystitis)     Vitreous hemorrhage 05/21/2014    PVD (posterior vitreous  detachment), right 05/21/2014      Past Surgical History   Procedure Laterality Date    Hx tonsillectomy/adenoidectomy       Tonsillectomy Conversion Data     Cesarean section, classic       Cesarean Section Conversion Data     Bunionectomy       Hallux Valgus (Bunion) Correction Conversion Data     Cataract removal  10/24/09     PC IOL OS    Retinal detachment surgery       X 3    25g ppv/ivt/afx/el/22%sf6 os  05/16/09    Sb/20g ppv/mo/pfc/rtx/afx/15% c3 f8 os  06/20/09    Barrier lpc os  07/30/09    Phaco/iol/25g ppv/pcox/mp/afx/icg/ilmx/14% c3 f8 os  10/24/09    Intra vitreal triesence os  05/30/10     4MG     Avastin #1 os 08/14/11 consent 08/14/11 maxitrol      Barrier laser, right eye  05/20/14     Dr Denyce Robert      History   Smoking status    Former Smoker    Types: Cigarettes    Quit date: 02/15/1981   Smokeless tobacco    Not on file      History   Alcohol Use    Yes      History   Drug Use No      Specialty Problems        Ophthalmology Problems    Cataract        Cystoid macular edema of left eye        Refractive error        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    Base Eye Exam     Visual Acuity (Snellen - Linear)      Right Left   Dist cc 20/25 +2          Tonometry (Tonopen, 8:51 AM)      Right Left   Pressure 17          Neuro/Psych     Oriented x3:  Yes    Mood/Affect:   Normal      Dilation     Pharm dilated            Slit Lamp and Fundus Exam     External Exam      Right Left    External Normal Normal      Slit Lamp Exam      Right Left    Lids/Lashes Normal Normal    Conjunctiva/Sclera White and quiet White and quiet    Cornea Clear Clear    Anterior Chamber Deep and quiet Deep and quiet    Iris Round and reactive Round and reactive    Lens 1+ Nuclear sclerosis Posterior chamber intraocular lens; 3+ PCO    Vitreous Posterior vitreous detachment; mild VH clear      Fundus Exam      Right Left    Disc Normal Normal    C/D Ratio 0.3  0.5    Macula Normal SR band, resolved ME    Vessels Normal attenuated    Periphery Lattice ST and IT, tear at 11 o clock, Horseshoe tear, tear with lattice, Lattice, Laser scar, Good laser, VH, New HST with lattice, no tear, Bridging vessel inf 180 Retx; attached, Laser scar, Macula attached, laser                                     Assessment/Plan:   Assessment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, right    PVD with VH and retinal tears  --s/p barrier LPC OD (05/20/14)  --s/p barrier LPC OD (05/21/14)  --s/p barrier LPC OD (05/24/14) -- new VH today from bridging vessels at 1:30 across tear that has been lasered  --SSX of RD explained and understood  --f/u 31m    Chronic pseudophakic ME OS  HX OF RD repair X 3 (3/11), now resolved  Following yearly now    Hx RD repair OS  RD repair X 3 (3/11)  See above    PCIOL OS  -PCO OS, follow for now

## 2014-06-03 ENCOUNTER — Encounter: Payer: Self-pay | Admitting: Retina Specialist

## 2014-06-03 DIAGNOSIS — H33311 Horseshoe tear of retina without detachment, right eye: Secondary | ICD-10-CM

## 2014-06-03 NOTE — Assessment & Plan Note (Signed)
PVD with VH and retinal tears  --s/p barrier LPC OD (05/20/14)  --s/p barrier LPC OD (05/21/14)  --s/p barrier LPC OD (05/24/14) -- new VH today from bridging vessels at 1:30 across tear that has been lasered  --SSX of RD explained and understood  --f/u next Thursday at 10am    Chronic pseudophakic ME OS  HX OF RD repair X 3 (3/11), now resolved  Following yearly now    Hx RD repair OS  RD repair X 3 (3/11)  See above    PCIOL OS  -PCO OS, follow for now

## 2014-06-03 NOTE — Progress Notes (Signed)
Outpatient Visit      Patient name: Veronica Bishop [id number]  Medical Record Number: [id number]  DOB: 10/15/1955  Age: 58 y.o.     Date: 06/03/2014    Subjective:     Chief Complaint   Patient presents with    Blurred Vision       HPI     58 y/o woman with hx of retinal detachment left eye and multiple tears in   her right eye, coming in today complaining of decreased vision in the   right eye, symptoms started 1 hour ago          Chief Complaint:   Chief Complaint   Patient presents with    Blurred Vision     HPI     58 y/o woman with hx of retinal detachment left eye and multiple tears in   her right eye, coming in today complaining of decreased vision in the   right eye, symptoms started 1 hour ago        ROS     Positive for: Eyes    Negative for: Constitutional, Gastrointestinal, Neurological, Skin,   Genitourinary, Musculoskeletal, HENT, Endocrine, Cardiovascular,   Respiratory, Psychiatric, Allergic/Imm, Heme/Lymph      No current outpatient prescriptions on file. (Ophthalmic)     Current Outpatient Prescriptions (Other)   Medication Sig Dispense Refill    tolterodine (DETROL LA) 4 MG 24 hr capsule Take 1 capsule (4 mg total) by mouth daily   Swallow whole. Do not crush or chew. 90 capsule 3    tolterodine (DETROL LA) 4 MG 24 hr capsule Take 1 capsule (4 mg total) by mouth daily   Swallow whole. Do not crush or chew. 30 capsule 12    ALPRAZolam (XANAX) 1 MG tablet Take 1 tablet approximately 1 hour before your Cystoscopy. Have someone drive you to and from the appointment.MDD1 1 tablet 0    phenazopyridine (PYRIDIUM) 200 MG tablet Take 1 tablet (200 mg total) by mouth 3 times daily as needed for Pain 30 tablet 3    cephalexin (KEFLEX) 500 MG capsule 1 PO with intercourse. 15 capsule 3    mirabegron (MYRBETRIQ) 50 MG 24 hr tablet Take 1 tablet (50 mg total) by mouth daily 90 tablet 3    tamsulosin (FLOMAX) 0.4 MG TAKE 1 CAPSULE (0.4 MG TOTAL) BY MOUTH EVERY EVENING 90 capsule 2    naproxen sodium  (ANAPROX) 220 MG tablet Take 220 mg by mouth daily      aspirin 81 MG tablet Take 81 mg by mouth daily      EPINEPHrine (EPIPEN) 0.3 MG/0.3ML SOAJ injection INJECT 0.3ML INTRAMUSCULARLY AS DIRECTED 1 Device 5         is allergic to sulfa drugs and no known latex allergy.      Past Medical History   Diagnosis Date    Macular edema      PERSISITENT PSEUDOPHAKIC OS    Cataract     Refractive error 03/27/2011    Chronic kidney disease     Varicella     IC (interstitial cystitis)     Vitreous hemorrhage 05/21/2014    PVD (posterior vitreous detachment), right 05/21/2014        Past Surgical History   Procedure Laterality Date    Hx tonsillectomy/adenoidectomy       Tonsillectomy Conversion Data     Cesarean section, classic       Cesarean Section Conversion Data     Bunionectomy  Hallux Valgus (Bunion) Correction Conversion Data     Cataract removal  10/24/09     PC IOL OS    Retinal detachment surgery       X 3    25g ppv/ivt/afx/el/22%sf6 os  05/16/09    Sb/20g ppv/mo/pfc/rtx/afx/15% c3 f8 os  06/20/09    Barrier lpc os  07/30/09    Phaco/iol/25g ppv/pcox/mp/afx/icg/ilmx/14% c3 f8 os  10/24/09    Intra vitreal triesence os  05/30/10     4MG     Avastin #1 os 08/14/11 consent 08/14/11 maxitrol      Barrier laser, right eye  05/20/14     Dr Karalee Height      History   Smoking status    Former Smoker    Types: Cigarettes    Quit date: 02/15/1981   Smokeless tobacco    Not on file      History   Alcohol Use    Yes      History   Drug Use No      Specialty Problems        Ophthalmology Problems    Cataract        Cystoid macular edema of left eye        Refractive error        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, right        PVD (posterior vitreous detachment), right        Vitreous hemorrhage               History Changes noted as reviewed for encounter 06/03/2014  Review of Systems: Changes noted as reviewed for encounter 06/03/2014  Medications: Changes noted and as reviewed for encounter  06/03/2014  Allergies: Changes noted and as reviewed for encounter 06/03/2014  Problem List:  Changes noted As reviewed for encounter 06/03/2014    Objective:     Base Eye Exam     Neuro/Psych     Oriented x3:  Yes    Mood/Affect:  Normal            Slit Lamp and Fundus Exam     External Exam      Right Left    External Normal Normal      Slit Lamp Exam      Right Left    Lids/Lashes Normal Normal    Conjunctiva/Sclera White and quiet White and quiet    Cornea Clear Clear    Anterior Chamber Deep and quiet Deep and quiet    Iris Round and reactive Round and reactive    Lens 1+ Nuclear sclerosis Posterior chamber intraocular lens; 3+ PCO    Vitreous Posterior vitreous detachment; mild VH clear      Fundus Exam      Right Left    Disc Normal Normal    C/D Ratio 0.3 0.5    Macula Normal SR band, resolved ME    Vessels Normal attenuated    Periphery Lattice ST and IT, tear at 11 o clock, Horseshoe tear, tear with lattice, Lattice, Laser scar, Good laser, VH, New HST with lattice, no tear, Bridging vessel, New Bleed inf 180 Retx; attached, Laser scar, Macula attached, laser                      Assessment/Plan:      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

## 2014-06-07 ENCOUNTER — Ambulatory Visit: Payer: Self-pay | Admitting: Retina Ophthalmology

## 2014-06-07 DIAGNOSIS — H33311 Horseshoe tear of retina without detachment, right eye: Secondary | ICD-10-CM

## 2014-06-07 NOTE — Progress Notes (Signed)
Subjective:   Subjective10/15/2015   Chief Complaint   Patient presents with    Blurred Vision     HPI       PVD with VH and retinal tears  --s/p barrier LPC OD (05/20/14)  --s/p barrier LPC OD (05/21/14)  --s/p barrier LPC OD (05/24/14) -- new VH today from bridging vessels at   1:30 across tear that has been lasered  --SSX of RD explained and understood  --f/u 32m    Chronic pseudophakic ME OS  HX OF RD repair X 3 (3/11), now resolved  Following yearly now    Hx RD repair OS  RD repair X 3 (3/11)  See above    PCIOL OS  -PCO OS, follow for now          Current Outpatient Rx   Name  Route  Sig  Dispense  Refill    tolterodine (DETROL LA) 4 MG 24 hr capsule    Oral    Take 1 capsule (4 mg total) by mouth daily   Swallow whole. Do not crush or chew.    90 capsule    3      tolterodine (DETROL LA) 4 MG 24 hr capsule    Oral    Take 1 capsule (4 mg total) by mouth daily   Swallow whole. Do not crush or chew.    30 capsule    12      ALPRAZolam (XANAX) 1 MG tablet        Take 1 tablet approximately 1 hour before your Cystoscopy. Have someone drive you to and from the appointment.MDD1    1 tablet    0      phenazopyridine (PYRIDIUM) 200 MG tablet    Oral    Take 1 tablet (200 mg total) by mouth 3 times daily as needed for Pain    30 tablet    3       Created by Conversion      cephalexin (KEFLEX) 500 MG capsule        1 PO with intercourse.    15 capsule    3      mirabegron (MYRBETRIQ) 50 MG 24 hr tablet    Oral    Take 1 tablet (50 mg total) by mouth daily    90 tablet    3      tamsulosin (FLOMAX) 0.4 MG        TAKE 1 CAPSULE (0.4 MG TOTAL) BY MOUTH EVERY EVENING    90 capsule    2      naproxen sodium (ANAPROX) 220 MG tablet    Oral    Take 220 mg by mouth daily              aspirin 81 MG tablet    Oral    Take 81 mg by mouth daily              EPINEPHrine (EPIPEN) 0.3 MG/0.3ML SOAJ injection        INJECT 0.3ML INTRAMUSCULARLY AS DIRECTED    1 Device    5       Created by Conversion        Sulfa drugs and No  known latex allergy   No birth history on file.  Past Medical History   Diagnosis Date    Macular edema      PERSISITENT PSEUDOPHAKIC OS    Cataract     Refractive error 03/27/2011    Chronic  kidney disease     Varicella     IC (interstitial cystitis)     Vitreous hemorrhage 05/21/2014    PVD (posterior vitreous detachment), right 05/21/2014      Past Surgical History   Procedure Laterality Date    Hx tonsillectomy/adenoidectomy       Tonsillectomy Conversion Data     Cesarean section, classic       Cesarean Section Conversion Data     Bunionectomy       Hallux Valgus (Bunion) Correction Conversion Data     Cataract removal  10/24/09     PC IOL OS    Retinal detachment surgery       X 3    25g ppv/ivt/afx/el/22%sf6 os  05/16/09    Sb/20g ppv/mo/pfc/rtx/afx/15% c3 f8 os  06/20/09    Barrier lpc os  07/30/09    Phaco/iol/25g ppv/pcox/mp/afx/icg/ilmx/14% c3 f8 os  10/24/09    Intra vitreal triesence os  05/30/10     4MG     Avastin #1 os 08/14/11 consent 08/14/11 maxitrol      Barrier laser, right eye  05/20/14     Dr Denyce Robert      History   Smoking status    Former Smoker    Types: Cigarettes    Quit date: 02/15/1981   Smokeless tobacco    Not on file      History   Alcohol Use    Yes      History   Drug Use No      Specialty Problems        Ophthalmology Problems    Cataract        Cystoid macular edema of left eye        Refractive error        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    Base Eye Exam     Visual Acuity (Snellen - Linear)      Right Left   Dist cc 20/25 -2 20/400         Tonometry (Tonopen, 10:21 AM)      Right Left   Pressure 17 15         Neuro/Psych     Oriented x3:  Yes    Mood/Affect:  Normal            Slit Lamp and Fundus Exam     External Exam      Right Left    External Normal Normal      Slit Lamp Exam         Right Left    Lids/Lashes Normal Normal    Conjunctiva/Sclera White and quiet White and quiet    Cornea Clear Clear    Anterior Chamber Deep and quiet Deep and quiet    Iris Round and reactive Round and reactive    Lens 1+ Nuclear sclerosis Posterior chamber intraocular lens; 3+ PCO    Vitreous Posterior vitreous detachment; mild VH clear      Fundus Exam      Right Left    Disc Normal Normal    C/D Ratio 0.3 0.5    Macula Normal SR band, resolved ME    Vessels Normal attenuated  Periphery Lattice ST and IT, tear at 11 o clock, Horseshoe tear, tear with lattice, Lattice, Laser scar, Good laser, VH, New HST with lattice, no tear, Bridging vessel, New Bleed inf 180 Retx; attached, Laser scar, Macula attached, laser            Refraction     Wearing Rx      Sphere Cylinder Axis Add   Right -0.75 +0.75 142 +2.50   Left +2.50  180 +2.00                                        Assessment/Plan:   Assessment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, right    PVD with VH and retinal tears  --s/p barrier LPC OD (05/20/14)  --s/p barrier LPC OD (05/21/14)  --s/p barrier LPC OD (05/24/14) -- persistent VH today from bridging vessels at 1:30 across tear that has been lasered  --SSX of RD explained and understood  --f/u 2 weeks    Chronic pseudophakic ME OS  HX OF RD repair X 3 (3/11), now resolved  Following yearly now    Hx RD repair OS  RD repair X 3 (3/11)  See above    PCIOL OS  -PCO OS, follow for now

## 2014-06-07 NOTE — Assessment & Plan Note (Signed)
PVD with VH and retinal tears  --s/p barrier LPC OD (05/20/14)  --s/p barrier LPC OD (05/21/14)  --s/p barrier LPC OD (05/24/14) -- persistent VH today from bridging vessels at 1:30 across tear that has been lasered  --SSX of RD explained and understood  --f/u 2 weeks    Chronic pseudophakic ME OS  HX OF RD repair X 3 (3/11), now resolved  Following yearly now    Hx RD repair OS  RD repair X 3 (3/11)  See above    PCIOL OS  -PCO OS, follow for now

## 2014-06-22 ENCOUNTER — Ambulatory Visit: Payer: Self-pay | Admitting: Retina Ophthalmology

## 2014-06-22 ENCOUNTER — Encounter: Payer: Self-pay | Admitting: Retina Ophthalmology

## 2014-06-22 DIAGNOSIS — H33311 Horseshoe tear of retina without detachment, right eye: Secondary | ICD-10-CM

## 2014-06-22 NOTE — Assessment & Plan Note (Signed)
PVD with VH and retinal tears  --s/p barrier LPC OD (05/20/14)  --s/p barrier LPC OD (05/21/14)  --s/p barrier LPC OD (05/24/14)   --doing great, no new heme or tears  --f/u 87m dilate OD  --SSX of RD explained and understood    Chronic pseudophakic ME OS  HX OF RD repair X 3 (3/11), now resolved  Following yearly now    Hx RD repair OS  RD repair X 3 (3/11)  See above    PCIOL OS  -PCO OS, follow for now

## 2014-06-22 NOTE — Progress Notes (Signed)
Subjective:   Subjective10/30/2015   Chief Complaint   Patient presents with    Blurred Vision     HPI     2 week Follow Up     PVD with VH and retinal tears  --s/p barrier LPC OD (05/20/14)  --s/p barrier LPC OD (05/21/14)  --s/p barrier LPC OD (05/24/14)  Chronic pseudophakic ME OS  Hx RD repair OS  --RD repair x3 (3/11)  --see above  PCIOL OS  --PCO OS  Lattice OD    Pt reported vision is clearing up in OD. Denies any pain. No increase in   stable floaters.           Current Outpatient Rx   Name  Route  Sig  Dispense  Refill    tolterodine (DETROL LA) 4 MG 24 hr capsule    Oral    Take 1 capsule (4 mg total) by mouth daily   Swallow whole. Do not crush or chew.    90 capsule    3      tolterodine (DETROL LA) 4 MG 24 hr capsule    Oral    Take 1 capsule (4 mg total) by mouth daily   Swallow whole. Do not crush or chew.    30 capsule    12      ALPRAZolam (XANAX) 1 MG tablet        Take 1 tablet approximately 1 hour before your Cystoscopy. Have someone drive you to and from the appointment.MDD1    1 tablet    0      phenazopyridine (PYRIDIUM) 200 MG tablet    Oral    Take 1 tablet (200 mg total) by mouth 3 times daily as needed for Pain    30 tablet    3       Created by Conversion      cephalexin (KEFLEX) 500 MG capsule        1 PO with intercourse.    15 capsule    3      mirabegron (MYRBETRIQ) 50 MG 24 hr tablet    Oral    Take 1 tablet (50 mg total) by mouth daily    90 tablet    3      tamsulosin (FLOMAX) 0.4 MG        TAKE 1 CAPSULE (0.4 MG TOTAL) BY MOUTH EVERY EVENING    90 capsule    2      naproxen sodium (ANAPROX) 220 MG tablet    Oral    Take 220 mg by mouth daily              aspirin 81 MG tablet    Oral    Take 81 mg by mouth daily              EPINEPHrine (EPIPEN) 0.3 MG/0.3ML SOAJ injection        INJECT 0.3ML INTRAMUSCULARLY AS DIRECTED    1 Device    5       Created by Conversion        Sulfa drugs and No known latex allergy   No birth history on file.  Past Medical History   Diagnosis  Date    Macular edema      PERSISITENT PSEUDOPHAKIC OS    Cataract     Refractive error 03/27/2011    Chronic kidney disease     Varicella     IC (interstitial cystitis)     Vitreous hemorrhage  05/21/2014    PVD (posterior vitreous detachment), right 05/21/2014      Past Surgical History   Procedure Laterality Date    Hx tonsillectomy/adenoidectomy       Tonsillectomy Conversion Data     Cesarean section, classic       Cesarean Section Conversion Data     Bunionectomy       Hallux Valgus (Bunion) Correction Conversion Data     Cataract removal  10/24/09     PC IOL OS    Retinal detachment surgery       X 3    25g ppv/ivt/afx/el/22%sf6 os  05/16/09    Sb/20g ppv/mo/pfc/rtx/afx/15% c3 f8 os  06/20/09    Barrier lpc os  07/30/09    Phaco/iol/25g ppv/pcox/mp/afx/icg/ilmx/14% c3 f8 os  10/24/09    Intra vitreal triesence os  05/30/10     4MG     Avastin #1 os 08/14/11 consent 08/14/11 maxitrol      Barrier laser, right eye  05/20/14     Dr Denyce Robert      History   Smoking status    Former Smoker    Types: Cigarettes    Quit date: 02/15/1981   Smokeless tobacco    Not on file      History   Alcohol Use    Yes      History   Drug Use No      Specialty Problems        Ophthalmology Problems    Cataract        Cystoid macular edema of left eye        Refractive error        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    Base Eye Exam     Visual Acuity (Snellen - Linear)      Right Left   Dist sc  20/400   Dist cc 20/20 -1        Correction:  Glasses      Tonometry (Tonopen, 3:02 PM)      Right Left   Pressure 22             Tonometry #2      Right Left   Pressure 21          Pupils      APD   Right    Left +       Pharm Dilated       Neuro/Psych     Oriented x3:  Yes    Mood/Affect:  Normal            Slit Lamp and Fundus Exam     External Exam      Right Left    External Normal Normal      Slit Lamp Exam      Right Left    Lids/Lashes Normal Normal    Conjunctiva/Sclera White and quiet White and quiet    Cornea Clear Clear    Anterior Chamber Deep and quiet Deep and  quiet    Iris Round and reactive Round and reactive    Lens 1+ Nuclear sclerosis Posterior chamber intraocular lens; 3+ PCO    Vitreous Posterior vitreous detachment; mild VH clear      Fundus Exam      Right Left    Disc Normal Normal    C/D Ratio 0.3 0.5    Macula Normal SR band, resolved ME    Vessels Normal attenuated    Periphery Multiple tears scattered 360 all lasered with no new heme inf 180 Retx; attached, Laser scar, Macula attached, laser                                     Assessment/Plan:   Assessment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, right    PVD with VH and retinal tears  --s/p barrier LPC OD (05/20/14)  --s/p barrier LPC OD (05/21/14)  --s/p barrier LPC OD (05/24/14)   --doing great, no new heme or tears  --f/u 43m dilate OD  --SSX of RD explained and understood    Chronic pseudophakic ME OS  HX OF RD repair X 3 (3/11), now resolved  Following yearly now    Hx RD repair OS  RD repair X 3 (3/11)  See above    PCIOL OS  -PCO OS, follow for now

## 2014-06-26 ENCOUNTER — Ambulatory Visit: Payer: Self-pay | Admitting: Retina Ophthalmology

## 2014-07-24 ENCOUNTER — Ambulatory Visit: Payer: Self-pay | Admitting: Retina Ophthalmology

## 2014-07-24 DIAGNOSIS — H33311 Horseshoe tear of retina without detachment, right eye: Secondary | ICD-10-CM

## 2014-07-24 NOTE — Assessment & Plan Note (Signed)
PVD with VH and retinal tears  --s/p barrier LPC OD (05/20/14)  --s/p barrier LPC OD (05/21/14)  --s/p barrier LPC OD (05/24/14)   --doing great, no new heme or tears  --f/u 54m dilate OD  --SSX of RD explained and understood    Chronic pseudophakic ME OS  HX OF RD repair X 3 (3/11), now resolved  Following yearly now    Hx RD repair OS  RD repair X 3 (3/11)  See above    PCIOL OS  -PCO OS, follow for now

## 2014-07-24 NOTE — Progress Notes (Signed)
Subjective:   Subjective12/08/2013   Chief Complaint   Patient presents with    Post-op     HPI     PVD with VH and retinal tears  --s/p barrier LPC OD (05/20/14)  --s/p barrier LPC OD (05/21/14)  --s/p barrier LPC OD (05/24/14)  Chronic pseudophakic ME OS  Hx RD repair OS  --RD repair x3 (3/11)  --see above  PCIOL OS  --PCO OS  Lattice OD    Patient states Va has been good +floaters right eye, denies flashes.         Current Outpatient Rx   Name  Route  Sig  Dispense  Refill    tolterodine (DETROL LA) 4 MG 24 hr capsule    Oral    Take 1 capsule (4 mg total) by mouth daily   Swallow whole. Do not crush or chew.    90 capsule    3      tolterodine (DETROL LA) 4 MG 24 hr capsule    Oral    Take 1 capsule (4 mg total) by mouth daily   Swallow whole. Do not crush or chew.    30 capsule    12      ALPRAZolam (XANAX) 1 MG tablet        Take 1 tablet approximately 1 hour before your Cystoscopy. Have someone drive you to and from the appointment.MDD1    1 tablet    0      phenazopyridine (PYRIDIUM) 200 MG tablet    Oral    Take 1 tablet (200 mg total) by mouth 3 times daily as needed for Pain    30 tablet    3       Created by Conversion      cephalexin (KEFLEX) 500 MG capsule        1 PO with intercourse.    15 capsule    3      mirabegron (MYRBETRIQ) 50 MG 24 hr tablet    Oral    Take 1 tablet (50 mg total) by mouth daily    90 tablet    3      tamsulosin (FLOMAX) 0.4 MG        TAKE 1 CAPSULE (0.4 MG TOTAL) BY MOUTH EVERY EVENING    90 capsule    2      naproxen sodium (ANAPROX) 220 MG tablet    Oral    Take 220 mg by mouth daily              aspirin 81 MG tablet    Oral    Take 81 mg by mouth daily              EPINEPHrine (EPIPEN) 0.3 MG/0.3ML SOAJ injection        INJECT 0.3ML INTRAMUSCULARLY AS DIRECTED    1 Device    5       Created by Conversion        Sulfa drugs and No known latex allergy   No birth history on file.  Past Medical History   Diagnosis Date    Macular edema      PERSISITENT PSEUDOPHAKIC OS       Cataract     Refractive error 03/27/2011    Chronic kidney disease     Varicella     IC (interstitial cystitis)     Vitreous hemorrhage 05/21/2014    PVD (posterior vitreous detachment), right 05/21/2014      Past  Surgical History   Procedure Laterality Date    Hx tonsillectomy/adenoidectomy       Tonsillectomy Conversion Data     Cesarean section, classic       Cesarean Section Conversion Data     Bunionectomy       Hallux Valgus (Bunion) Correction Conversion Data     Cataract removal  10/24/09     PC IOL OS    Retinal detachment surgery       X 3    25g ppv/ivt/afx/el/22%sf6 os  05/16/09    Sb/20g ppv/mo/pfc/rtx/afx/15% c3 f8 os  06/20/09    Barrier lpc os  07/30/09    Phaco/iol/25g ppv/pcox/mp/afx/icg/ilmx/14% c3 f8 os  10/24/09    Intra vitreal triesence os  05/30/10     4MG     Avastin #1 os 08/14/11 consent 08/14/11 maxitrol      Barrier laser, right eye  05/20/14     Dr Denyce Robert      History   Smoking status    Former Smoker    Types: Cigarettes    Quit date: 02/15/1981   Smokeless tobacco    Not on file      History   Alcohol Use    Yes      History   Drug Use No      Specialty Problems        Ophthalmology Problems    Cataract        Cystoid macular edema of left eye        Refractive error        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    Base Eye Exam     Visual Acuity (Snellen - Linear)      Right Left   Dist sc  20/400   Dist cc 20/20          Tonometry (Tonopen, 10:58 AM)      Right Left   Pressure 14 14         Neuro/Psych     Oriented x3:  Yes    Mood/Affect:  Normal      Dilation     Right eye:  2.5% Phenylephrine, 1.0% Tropicamide @ 10:58 AM    Patient dilated left eye at home by accident            Slit Lamp and Fundus Exam     External Exam      Right Left    External Normal Normal      Slit Lamp Exam       Right Left    Lids/Lashes Normal Normal    Conjunctiva/Sclera White and quiet White and quiet    Cornea Clear Clear    Anterior Chamber Deep and quiet Deep and quiet    Iris Round and reactive Round and reactive    Lens 1+ Nuclear sclerosis Posterior chamber intraocular lens; 3+ PCO    Vitreous Posterior vitreous detachment; mild VH clear      Fundus Exam      Right Left    Disc Normal Normal    C/D Ratio 0.3 0.5    Macula Normal SR band, resolved ME    Vessels Normal attenuated  Periphery Multiple tears scattered 360 all lasered with no new heme inf 180 Retx; attached, Laser scar, Macula attached, laser                                     Assessment/Plan:   Assessment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, right    PVD with VH and retinal tears  --s/p barrier LPC OD (05/20/14)  --s/p barrier LPC OD (05/21/14)  --s/p barrier LPC OD (05/24/14)   --doing great, no new heme or tears  --f/u 75m dilate OD  --SSX of RD explained and understood    Chronic pseudophakic ME OS  HX OF RD repair X 3 (3/11), now resolved  Following yearly now    Hx RD repair OS  RD repair X 3 (3/11)  See above    PCIOL OS  -PCO OS, follow for now

## 2014-07-27 ENCOUNTER — Telehealth: Payer: Self-pay | Admitting: Retina Ophthalmology

## 2014-07-27 NOTE — Telephone Encounter (Signed)
I spoke with pt regarding 2 appts in March.  Pt states Veronica Bishop had already spoken with her.  Appt on 11/16/14 was not cancelled.  Pt confirmed that she will be here for the 10/26/14 appt, so I cancelled the 11/16/14 appt.

## 2014-08-13 ENCOUNTER — Encounter: Payer: Self-pay | Admitting: Urology

## 2014-08-13 ENCOUNTER — Ambulatory Visit: Payer: Self-pay | Admitting: Urology

## 2014-08-13 VITALS — BP 141/79 | HR 77 | Ht 62.0 in | Wt 183.0 lb

## 2014-08-13 DIAGNOSIS — N3281 Overactive bladder: Secondary | ICD-10-CM

## 2014-08-13 DIAGNOSIS — R339 Retention of urine, unspecified: Secondary | ICD-10-CM

## 2014-08-13 DIAGNOSIS — N390 Urinary tract infection, site not specified: Secondary | ICD-10-CM

## 2014-08-13 DIAGNOSIS — N3010 Interstitial cystitis (chronic) without hematuria: Secondary | ICD-10-CM

## 2014-08-13 LAB — POCT URINALYSIS DIPSTICK
Blood,UA POCT: NEGATIVE
Glucose,UA POCT: NORMAL
Ketones,UA POCT: NEGATIVE
Leuk Esterase,UA POCT: NEGATIVE
Lot #: 20526703
Nitrite,UA POCT: NEGATIVE
PH,UA POCT: 7 (ref 5–8)
Protein,UA POCT: NEGATIVE mg/dL
Specific gravity,UA POCT: 1.005 (ref 1.002–1.03)

## 2014-08-13 NOTE — Progress Notes (Signed)
Visit Diagnosis(es) or CC: UTIs, OAB, IC, Elevated PVR    HPI:  The patient comes to the office today in follow up for the above diagnosis (-es).  She took Xanax today, anticipating she might require cystoscopy, so her daughter accompanied her.     OAB/IC  She is on Detrol LA 4 once daily at this point, along with flomax.  "I think they both make a difference."    It is more effective than other agents, including Myrbetriq.      UTIs  She finished IM gentamicin on 01-12-14.  "I have been fine ever since."  She has had no UTIs since.  She has no current symptoms.  She is using keflex just after intercourse only.    She is content with that.    Cystoscopy on 01-08-14 was normal.      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  Eyes: Denies change in vision.  ENT: Denies hearing problems, or loss. Denies swollen glands or stiff neck.  Chest: Denies recent cold, flu or upper respiratory illness. Denies cough or dyspnea.  Heart: Denies recent "heart trouble," chest pain, or palpitations.  GI: Denies constipation, diarrhea, acid reflux or bloody stool.  GU: Denies urogenital complaints, except as outlined in the HPI.  Musculoskeletal: Denies any muscle or joint pain, stiffness, or progressive arthritis.         Objective:    GENERAL:  No acute distress.  Well developed, well nourished.  NEUROLOGIC:  Oriented to person, place, time, and situation  PSYCHIATRIC:  Normal mood and affect  HEENT:  NC/AT.  Sclerae anicteric.  Nose/mouth/ears without obvious lesions.  NECK:  Trachea midline.  No JVD in sitting position.    SKIN:  Normal color, turgor, texture, hydration  LUNGS:  Respirations unlabored.    HEART:  Regular rate and rhythm  BACK:  No CVAT  ABD:  Soft, NT/ND, no masses or bladder  distention  EXTR:  No calf swelling or tenderness          PVR:   41 ccs    CF 150 ccs    CF Not assessed today    CF 42 ccs    CF 102 ccs    Renal Ultrasound:   12-28-13:  IMPRESSION:  No evidence of hydronephrosis or focal lesions. Incidentally noted   Hepatic steatosis.  END OF IMPRESSION.    Culture History:   Results for JAKERRA, FLOYD (MRN [id number]) as of 08/13/2014 16:34   Ref. Range 11/20/2013 08:24 12/11/2013 19:24 12/25/2013 16:39 01/08/2014 17:23 02/12/2014 16:41   Aerobic Culture No range found Escherichia coli Escherichia coli Escherichia coli . Marland Kitchen           ASSESSMENT:   UTIs    No recurrent infections    No current symptoms    Pleased with Keflex after intercourse   OAB/IC    Well compensated at present with Detrol LA4   Elevated PVR    Much improved today      PLAN:   Check urine culture   Continue Detrol and flomax    Antibiotics on hand for travel  FOLLOW-UP:   One year rather than six months    LOS:      Ruth, [phone]    Time spent in counseling and coordination of care (if used for LOS):  15 minute face-to-face visit, beyond cystoscopy, with >75% of that spent in counseling and coordination of care.

## 2014-08-15 ENCOUNTER — Telehealth: Payer: Self-pay | Admitting: Urology

## 2014-08-15 LAB — AEROBIC CULTURE

## 2014-08-15 NOTE — Telephone Encounter (Signed)
Veronica Bishop and reviewed the message below from Dr. Vernon Prey. Patient is not allergic to Macrobid. Called to [facility] as requested.     Notes Recorded by Kathreen Cosier, MD on 08/15/2014 at 2:33 PM  This patient has a UTI.  If not allergic, she needs macrobid BID for 7 days.  Thanks,  Waunita Schooner       2d ago     Aerobic Culture Escherichia coli   Comments:  70,000/ml

## 2014-08-19 ENCOUNTER — Other Ambulatory Visit: Payer: Self-pay | Admitting: Urology

## 2014-09-10 ENCOUNTER — Ambulatory Visit
Admit: 2014-09-10 | Discharge: 2014-09-10 | Disposition: A | Discharge status: Home / Self Care | Payer: Self-pay | Source: Ambulatory Visit | Attending: Urology | Admitting: Urology

## 2014-09-10 ENCOUNTER — Telehealth: Payer: Self-pay | Admitting: Urology

## 2014-09-10 ENCOUNTER — Encounter: Payer: Self-pay | Admitting: Primary Care

## 2014-09-10 DIAGNOSIS — Z139 Encounter for screening, unspecified: Secondary | ICD-10-CM

## 2014-09-10 DIAGNOSIS — K7581 Nonalcoholic steatohepatitis (NASH): Secondary | ICD-10-CM

## 2014-09-10 DIAGNOSIS — N390 Urinary tract infection, site not specified: Secondary | ICD-10-CM

## 2014-09-10 LAB — HEPATIC FUNCTION PANEL
ALT: 21 U/L (ref 0–35)
AST: 20 U/L (ref 0–35)
Albumin: 4.1 g/dL (ref 3.5–5.2)
Alk Phos: 74 U/L (ref 35–105)
Bilirubin,Direct: <0.2 mg/dL (ref 0.0–0.3)
Bilirubin,Total: 0.2 mg/dL (ref 0.0–1.2)
Total Protein: 6.7 g/dL (ref 6.3–7.7)

## 2014-09-10 LAB — LIPID PANEL
Chol/HDL Ratio: 3.7
Cholesterol: 229 mg/dL — AB
HDL: 62 mg/dL
LDL Calculated: 117 mg/dL
Non HDL Cholesterol: 167 mg/dL
Triglycerides: 249 mg/dL — AB

## 2014-09-10 LAB — BASIC METABOLIC PANEL
Anion Gap: 11 (ref 7–16)
CO2: 30 mmol/L — ABNORMAL HIGH (ref 20–28)
Calcium: 10 mg/dL (ref 8.6–10.2)
Chloride: 103 mmol/L (ref 96–108)
Creatinine: 0.7 mg/dL (ref 0.51–0.95)
GFR,Black: 111 *
GFR,Caucasian: 96 *
Glucose: 106 mg/dL — ABNORMAL HIGH (ref 60–99)
Lab: 18 mg/dL (ref 6–20)
Potassium: 4.1 mmol/L (ref 3.3–5.1)
Sodium: 144 mmol/L (ref 133–145)

## 2014-09-10 NOTE — Progress Notes (Signed)
WEATHER

## 2014-09-10 NOTE — Telephone Encounter (Signed)
Called Veronica Bishop and left a message on voice mail. Order entered for urine culture. Requested a return call to the [place] office if she is requesting antibiotic prior to culture results. Urology HIPAA reviewed.

## 2014-09-10 NOTE — Telephone Encounter (Signed)
Patient stated she has a UTI since last week. She is experiencing burning and pain when she urinates as well as frequency. Patient declined any other symptoms. She would like an order placed so she can drop a sample off at the [facility] in [place]. Please call her back to advise [phone]

## 2014-09-11 ENCOUNTER — Telehealth: Payer: Self-pay | Admitting: Primary Care

## 2014-09-11 LAB — HEMOGLOBIN A1C: Hemoglobin A1C: 5.3 % (ref 4.0–6.0)

## 2014-09-11 NOTE — Telephone Encounter (Signed)
Needs to reschedule her appointment

## 2014-09-12 LAB — AEROBIC CULTURE

## 2014-09-12 NOTE — Telephone Encounter (Signed)
APPT MADE

## 2014-09-12 NOTE — Progress Notes (Signed)
NO SHOW

## 2014-09-12 NOTE — Telephone Encounter (Signed)
lmtcb

## 2014-09-13 ENCOUNTER — Telehealth: Payer: Self-pay | Admitting: Urology

## 2014-09-13 ENCOUNTER — Encounter: Payer: Self-pay | Admitting: Primary Care

## 2014-09-13 DIAGNOSIS — N390 Urinary tract infection, site not specified: Secondary | ICD-10-CM

## 2014-09-13 NOTE — Telephone Encounter (Signed)
Left message

## 2014-09-13 NOTE — Telephone Encounter (Signed)
Spoke with Veronica Bishop. States she had a positive culture 08/15/14 treated with Macrobid. Out of town early January had a positive culture treated with Macrobid. This is her 3rd positive urine culture. Not sure Macrobid is working. She is symptomatic with urgency, pain and cannot regulate her body temperature going from hot to chills. Concerned she may need injections to clear the infection. Please advise. [place] patient)

## 2014-09-13 NOTE — Telephone Encounter (Signed)
Patient aware and wanted to schedule an appointment for a physical.

## 2014-09-13 NOTE — Telephone Encounter (Signed)
Pt called to let office know that she is being treated for her UTI by Winslow West  (urologist).And cancelled todays appt. For follow up. If you need her to reschedule for results of bloodwork please let me know.

## 2014-09-13 NOTE — Telephone Encounter (Signed)
I don't know, Collie Siad.  Please check to see if she has been notified and if antibiotics have been sent.  Thanks for following up on this.  Waunita Schooner

## 2014-09-13 NOTE — Telephone Encounter (Signed)
Dr. Vernon Prey,  What is happening with the antibiotic for this patient? Please see the encounter at the very bottom of this page. [place] patient)

## 2014-09-13 NOTE — Telephone Encounter (Signed)
It is not sensitive to anything else she can take.  Have her take the macrobid for two fulls weeks.  Thanks,  Waunita Schooner

## 2014-09-13 NOTE — Telephone Encounter (Signed)
Spoke with Veronica Bishop and called the [facility] to [facility] as requested below.  Patient requested standing order for aerobic cultures. Placed as requested and given 5.

## 2014-09-13 NOTE — Telephone Encounter (Signed)
The last time she had a general exam was 11/2012.   She needs a colonoscopy and a mammogram.

## 2014-09-13 NOTE — Telephone Encounter (Signed)
Dr. Vernon Prey,  Spoke with Romie Minus. States she had a positive culture 08/15/14 treated with Macrobid. Out of town early January had a positive culture treated with Macrobid. This is her 3rd positive urine culture. Not sure Macrobid is working. She is symptomatic with urgency, pain and cannot regulate her body temperature going from hot to chills. Concerned she may need injections to clear the infection. Please advise. [place] patient)      Notes Recorded by Kathreen Cosier, MD on 09/13/2014 at 8:05 AM  Please let the patient know that she has a UTI.  If symptomatic, she needs macrobid BID for 7 days.  Thanks,  Waunita Schooner       3d ago     Aerobic Culture Escherichia coli   Comments: >100,000/ml

## 2014-09-17 LAB — VITAMIN D
25-OH VIT D2: <4 ng/mL
25-OH VIT D3: 22 ng/mL
25-OH Vit Total: 22 ng/mL — ABNORMAL LOW (ref 30–60)

## 2014-09-25 ENCOUNTER — Ambulatory Visit: Payer: Self-pay | Admitting: Primary Care

## 2014-09-25 ENCOUNTER — Encounter: Payer: Self-pay | Admitting: Primary Care

## 2014-09-25 VITALS — BP 120/70 | HR 76 | Ht 62.0 in | Wt 194.0 lb

## 2014-09-25 DIAGNOSIS — Z139 Encounter for screening, unspecified: Secondary | ICD-10-CM

## 2014-09-25 DIAGNOSIS — Z124 Encounter for screening for malignant neoplasm of cervix: Secondary | ICD-10-CM

## 2014-09-25 NOTE — Patient Instructions (Signed)
·   Did you know that if you are insured, a SCREENING mammogram is no cost to you, due to the Affordable Care Act?  · If you have no insurance there is FREE screening available.*    *Free screening is for woman age 58 and older and women age 18 to 39 at high risk per their provider or who are experiencing symptoms - [phone].

## 2014-09-25 NOTE — Progress Notes (Signed)
Veronica Bishop is a 59 y.o. year who is menopausal and has no complaints about vaginal discharge or discomfort.  She has no complaints about her breasts.  She has no discharge, no lumps or changes in the skin in the breasts.  Veronica Bishop has no constipation or diarrhea, no dry skin or hair, no temperature intolerance, no palpitations.  She has no complaints about sex.      She has had no trouble with her blood pressure. Her cholesterol been not been high. She has not had broken bones as an adult.  She has no trouble falling asleep and has no trouble staying asleep. She has no nocturia : once. She has NO trouble getting back to sleep.     Veronica Bishop is married. She has two children. She has pets, three dogs. She is currently sexually active. Veronica Bishop last had a pap in 2013.   She last had a mammogram in 2014. She last had a dexa in ?[place] She last had a colonoscopy in 2004. She is supposed to have one every ten years due to normal exam.      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 She always wears her seatbelt. She last had a tetanus shot 2012. She has had a pneumovax; no. She has NOT had hpv vaccine. Veronica Bishop never smoked. She drinks alcohol one or two times a week. She typically has one or two drinks at one time. She uses no drugs. She exercises three days a week by swimming, exercise bike and core exercises..     Patients meds and allergies are reviewed today and there are no changes in family history.  See electronic record for details.    BP 120/70 mmHg   Pulse 76   Ht 1.575 m (5\' 2" )   Wt 87.998 kg (194 lb)   BMI 35.47 kg/m2Body mass index is 35.47 kg/(m^2).    GENERAL APPEARANCE: Normal Habitus.  Well-developed, well groomed.  Appears stated age.  No acute distress.  Color good.  MENTAL STATUS: Appears alert and oriented.  Affect appropriate.  SKIN: Skin color and turgor normal.  No suspicious lesions, masses, rashes, or ulcerations.  Nails and hair appear normal.  HEAD:  Normocephalic.  EYE: normal conjunctiva benign  EAR: External ear without scars, masses or lesions.   Acuity to conversational tones good.  OROPHARYNX: Moist   NECK: Symmetric, trachea midline.  No thyromegaly.  BREAST: normal without masses or discharge, without skin changes; axilla without masses  GI/ABDOMEN: Abdomen soft with normal bowel sounds.  No guarding or rebound.  No palpable masses or tenderness.  Liver and spleen are without tenderness or enlargement.  No aortic widening.  No inguinal adenopathy.  GU: Normal vulva and introitus without masses lesions, rashes or swelling.  Vaginal mucosa and cervix pink and moist without exudate, lesions, or ulcerations.  Uterus and adnexae palpable and not enlarged, nontender, and without masses.    RECTAL: Perineum and anus without lesions, masses or hemorrhoids.    EXTREMITIES: Joints with full range of motion, without tenderness, crepitance, or contracture.  No obvious joint deformities or effusions.  NEUROLOGICAL: Cranial nerves II through XII intact.  Motor strength symmetrical with no obvious weakness.  Superficial sensation intact bilaterally to light touch.  Observed dexterity without ataxia or tremor.  Gait coordinated and smooth.       DISCUSSION 59 y.o. year old female here for routine GYNECOLOGIC FOLLOW UP    She is  sexually active with a single partner and declines STD exam.  She still has her menses.    She will fu in five years for recheck unless there are any problems with this pap.        HCM  Immunization History   Administered Date(s) Administered    Influenza Injectable Quadrivalent with preservati 08/21/2013    Tdap 09/10/2010    Zoster 10/22/2013     Health Maintenance   Topic Date Due    CERVICAL CANCER SCREEN PAP EVERY 1 YEAR  04/10/1970    COLON CANCER SCREENING 10 YEAR COLONOSCOPY  08/26/2013    IMM-INFLUENZA (1) 04/24/2014    BREAST CANCER SCREENING  04/26/2014    HIV TESTING OFFERED  Addressed    HEPATITIS C SCREENING OFFERED   Completed       RECOMMENDED colonoscopy    Follow up one month for further evaluation .

## 2014-09-26 ENCOUNTER — Encounter: Payer: Self-pay | Admitting: Gastroenterology

## 2014-09-26 ENCOUNTER — Telehealth: Payer: Self-pay

## 2014-09-26 LAB — HM MAMMOGRAPHY

## 2014-09-26 NOTE — Telephone Encounter (Signed)
Called patient to advise there are no available CODs prior to 11/03/14. Left generic message stating schedule is in to July and her referral will be postponed until we are able to schedule for July.

## 2014-09-26 NOTE — Telephone Encounter (Signed)
[name] called to schedule a colonoscopy..  Patient requested a date before 3/12   The patient can be reached  at [phone].      1.  Is the patient taking any blood thinners? no    2.  Is the patient on any type of Kidney Dialysis? no    3.  Does the patient have an LVAD or a defibrillator? no    4.  Does the patient use a machine to help you breathe at night? no    5.  Is the patients weight greater than 350lbs? no         6.  Is the patient Diabetic? no

## 2014-09-27 ENCOUNTER — Telehealth: Payer: Self-pay | Admitting: Primary Care

## 2014-09-27 NOTE — Telephone Encounter (Signed)
Can you call pathology and see if it is possible to add HPV since I apparently didn't check that on the order.

## 2014-09-27 NOTE — Telephone Encounter (Signed)
Let [name] know her pap was normal.

## 2014-09-28 NOTE — Telephone Encounter (Signed)
done

## 2014-09-28 NOTE — Telephone Encounter (Signed)
Left message for patient to call office.  

## 2014-09-29 LAB — HPV DNA PROBE WITH CYTOLOGY
HPV Other High Risk: NEGATIVE
HPV Type 16: NEGATIVE
HPV Type 18: NEGATIVE

## 2014-09-30 ENCOUNTER — Telehealth: Payer: Self-pay | Admitting: Primary Care

## 2014-09-30 NOTE — Telephone Encounter (Signed)
Please let Ms. Rumer know her pap is normal.

## 2014-10-01 NOTE — Telephone Encounter (Signed)
Relayed message directly to patient

## 2014-10-04 ENCOUNTER — Telehealth: Payer: Self-pay | Admitting: Primary Care

## 2014-10-04 LAB — GYN CYTOLOGY

## 2014-10-04 NOTE — Telephone Encounter (Signed)
Please let Veronica Bishop know her pap was normal.

## 2014-10-05 NOTE — Telephone Encounter (Signed)
Patient aware.

## 2014-10-08 ENCOUNTER — Encounter: Payer: Self-pay | Admitting: Primary Care

## 2014-10-08 ENCOUNTER — Ambulatory Visit: Payer: Self-pay | Admitting: Primary Care

## 2014-10-08 VITALS — BP 126/82 | HR 72 | Ht 62.0 in | Wt 196.2 lb

## 2014-10-08 DIAGNOSIS — Z0000 Encounter for general adult medical examination without abnormal findings: Secondary | ICD-10-CM

## 2014-10-08 DIAGNOSIS — N390 Urinary tract infection, site not specified: Secondary | ICD-10-CM

## 2014-10-08 DIAGNOSIS — R300 Dysuria: Secondary | ICD-10-CM

## 2014-10-08 DIAGNOSIS — Z139 Encounter for screening, unspecified: Secondary | ICD-10-CM

## 2014-10-08 LAB — POCT URINALYSIS DIPSTICK
Bilirubin,Ur: NEGATIVE
Blood,UA POCT: NEGATIVE
Glucose,UA POCT: NORMAL
Ketones,UA POCT: NEGATIVE
Leuk Esterase,UA POCT: NEGATIVE
Lot #: 23302001
Nitrite,UA POCT: NEGATIVE
PH,UA POCT: 5 (ref 5–8)
Protein,UA POCT: NEGATIVE mg/dL
Specific gravity,UA POCT: 1.015 (ref 1.002–1.03)
Urobilinogen,UA: NORMAL

## 2014-10-08 MED ORDER — CEPHALEXIN 250 MG PO CAPS *I*
ORAL_CAPSULE | ORAL | Status: DC
Start: 2014-10-08 — End: 2015-08-12

## 2014-10-08 NOTE — Progress Notes (Signed)
Veronica Bishop is a 59 y.o. year woman who is here for a physical.    She has the following concerns:    Recurrent UTI    She has had no trouble with her blood pressure. Her cholesterol been not been high. She has not had broken bones as an adult. She has no trouble falling asleep and has no trouble staying asleep. She has no nocturia : once. She has NO trouble getting back to sleep.     Veronica Bishop is married. She has two children. She has pets, three dogs. She is currently sexually active. Veronica Bishop last had a pap in 2013. She last had a mammogram in 2016. She last had a dexa in ?[place] She last had a colonoscopy in 2004. She is supposed to have one every ten years due to normal exam. She is trying to schedule one now.    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 She always wears her seatbelt. She last had a tetanus shot 2012. She has had a pneumovax; no. She has NOT had hpv vaccine. Veronica Bishop never smoked. She drinks alcohol one or two times a week. She typically has one or two drinks at one time. She uses no drugs. She exercises three days a week by swimming, exercise bike and core exercises..      She has no family members with dialysis or kidney transplant.  She has no diabetes, no htn, no kidney stones, no use daily NSAID.    She has no history of mental illness such as depression or anxiety. She has no history of interpersonal violence in relationships or in childhood.     Patients meds and allergies are reviewed today no as was family history.  See electronic record for details.    BP 126/82 mmHg   Pulse 72   Ht 1.575 m (5\' 2" )   Wt 88.996 kg (196 lb 3.2 oz)   BMI 35.88 kg/m2  GENERAL APPEARANCE: Normal Habitus.  Well-developed, well groomed.  Appears stated age.  No acute distress.  Color good.  MENTAL STATUS: Appears alert and oriented.  Affect appropriate.  SKIN: Skin color and turgor normal.  No suspicious lesions, masses, rashes, or ulcerations.  Nails and hair  appear normal.  HEAD: Normocephalic.  EYE: normal conjunctiva, pupils equal and reactive, fundi benign  EAR: External ear without scars, masses or lesions.  External auditory canal intact, clear, and without lesions.  Tympanic membranes intact with normal light reflex and landmarks.  Acuity to conversational tones good.  MOUTH: Teeth in good repair.  Gums pink without lesions.  Normal appearing mucosa, palate, and tongue.  OROPHARYNX: Moist without exudate, erythema, or swelling.  NECK: Symmetric, trachea midline.  Full range of motion without pain or tenderness.  Thyroid nontender without enlargement or masses.  Carotid pulses normal without bruits.  No cervical lymph adenopathy.  CHEST: Respirations unlabored with normal diaphragmatic excursion.  Chest wall symmetric with no masses.  Breath sounds clear bilaterally without wheezes, rubs, rales, or rhonchi.  CV: Normal S1 and S2 without murmur or gallop or click.  Capillary refill within two seconds.  No clubbing, cyanosis, or edema.  No varicosities.  Radial, dorsalis pedis, and posterior tibial pulses full and symmetrical.  GI/ABDOMEN: Abdomen soft with normal bowel sounds.  No guarding or rebound.  No palpable masses or tenderness.  Liver and spleen are without tenderness or enlargement.  No aortic widening.    EXTREMITIES: Joints  with full range of motion, without tenderness, crepitance, or contracture.  No obvious joint deformities or effusions.  NEUROLOGICAL: Cranial nerves II through XII intact.  Motor strength symmetrical with no obvious weakness.  Superficial sensation intact bilaterally to light touch.  Observed dexterity without ataxia or tremor.  Gait coordinated and smooth.       DISCUSSION  - 59 y.o. with RECURRENT UTI    ASSESSMENT/PLAN    RECURRENT UTI  Reviewing old records she went six months or a year at a time with no infections when she was taking medicine to prevent uti after intercourse.  Resume cephalexin 250 mg with intercourse.      PERSISTENT DYSURIA   Despite appropriate therapy.  Check for vaginitis.    HCM  Immunization History   Administered Date(s) Administered    Influenza Injectable Quadrivalent with preservati 08/21/2013    Tdap 09/10/2010    Zoster 10/22/2013     Health Maintenance   Topic Date Due    COLON CANCER SCREENING 10 YEAR COLONOSCOPY  08/26/2013    IMM-INFLUENZA (1) 04/24/2014    BREAST CANCER SCREENING  04/26/2014    CERVICAL CANCER SCREEN PAP EVERY 1 YEAR  09/26/2015    HIV TESTING OFFERED  Addressed    HEPATITIS C SCREENING OFFERED  Completed       Patient is to follow-up in one year

## 2014-10-09 ENCOUNTER — Telehealth: Payer: Self-pay | Admitting: Primary Care

## 2014-10-09 LAB — VAGINITIS SCREEN: DNA PROBE: Vaginitis Screen:DNA Probe: POSITIVE

## 2014-10-09 NOTE — Telephone Encounter (Signed)
Please call Veronica Bishop and let her know that she has BV and I can send in a antibiotic by mouth or a vaginal treatment.

## 2014-10-10 MED ORDER — METRONIDAZOLE 500 MG PO TABS *I*
500.0000 mg | ORAL_TABLET | Freq: Three times a day (TID) | ORAL | Status: DC
Start: 2014-10-10 — End: 2015-02-19

## 2014-10-10 NOTE — Telephone Encounter (Signed)
Relayed message to patient she would prefer [facility] med

## 2014-10-11 ENCOUNTER — Other Ambulatory Visit: Payer: Self-pay | Admitting: Primary Care

## 2014-10-11 LAB — AEROBIC CULTURE

## 2014-10-11 MED ORDER — CEPHALEXIN 500 MG PO CAPS *I*
500.0000 mg | ORAL_CAPSULE | Freq: Three times a day (TID) | ORAL | Status: DC
Start: 2014-10-11 — End: 2015-02-19

## 2014-10-26 ENCOUNTER — Ambulatory Visit: Payer: Self-pay | Admitting: Retina Ophthalmology

## 2014-10-29 NOTE — Telephone Encounter (Signed)
Mailed letter to patient to schedule.

## 2014-11-13 ENCOUNTER — Telehealth: Payer: Self-pay | Admitting: Retina Ophthalmology

## 2014-11-13 NOTE — Telephone Encounter (Signed)
LM for pt to call back to reschedule missed appt

## 2014-11-16 ENCOUNTER — Ambulatory Visit: Payer: Self-pay | Admitting: Retina Ophthalmology

## 2014-11-30 ENCOUNTER — Encounter: Payer: Self-pay | Admitting: Retina Ophthalmology

## 2014-11-30 ENCOUNTER — Ambulatory Visit: Payer: Self-pay

## 2014-11-30 ENCOUNTER — Ambulatory Visit: Payer: Self-pay | Admitting: Retina Ophthalmology

## 2014-11-30 VITALS — BP 126/82 | HR 72

## 2014-11-30 DIAGNOSIS — H33311 Horseshoe tear of retina without detachment, right eye: Secondary | ICD-10-CM

## 2014-11-30 NOTE — Progress Notes (Signed)
Subjective:   Subjective4/03/2015   Chief Complaint   Patient presents with    Blurred Vision     HPI     4 Month Follow Up    PVD with VH and retinal tears   --s/p barrier LPC OD (05/20/14)   --s/p barrier LPC OD (05/21/14)   --s/p barrier LPC OD (05/24/14)   Chronic pseudophakic ME OS   Hx RD repair OS   --RD repair x3 (3/11)   --see above   PCIOL OS   --PCO OS   Lattice OD    Pt reported no changes in vision. Stable floaters in OD. Denies any pain.           Current Outpatient Rx   Name  Route  Sig  Dispense  Refill    olopatadine (PATADAY) 0.2 % ophthalmic solution    Both Eyes    Place 1 drop into both eyes daily              cephalexin (KEFLEX) 250 MG capsule        One by mouth prn intercourse    30 capsule    5      tamsulosin (FLOMAX) 0.4 MG        TAKE 1 CAPSULE (0.4 MG TOTAL) BY MOUTH EVERY EVENING    90 capsule    3      nitrofurantoin monohydrate macrocrystal (MACROBID) 100 MG capsule    Oral    Take 100 mg by mouth 2 times daily                 aspirin 81 MG tablet    Oral    Take 81 mg by mouth daily              EPINEPHrine (EPIPEN) 0.3 MG/0.3ML SOAJ injection        INJECT 0.3ML INTRAMUSCULARLY AS DIRECTED    1 Device    5       Created by Conversion      cephalexin (KEFLEX) 500 MG capsule    Oral    Take 1 capsule (500 mg total) by mouth 3 times daily    30 capsule    0      metroNIDAZOLE (FLAGYL) 500 MG tablet    Oral    Take 1 tablet (500 mg total) by mouth 3 times daily Avoid alcohol.    15 tablet    0      tolterodine (DETROL LA) 4 MG 24 hr capsule    Oral    Take 1 capsule (4 mg total) by mouth daily   Swallow whole. Do not crush or chew.    90 capsule    3      tolterodine (DETROL LA) 4 MG 24 hr capsule    Oral    Take 1 capsule (4 mg total) by mouth daily   Swallow whole. Do not crush or chew.    30 capsule    12      naproxen sodium (ANAPROX) 220 MG tablet    Oral    Take 220 mg by mouth daily                Sulfa antibiotics and No known latex allergy   No birth history on  file.  Past Medical History   Diagnosis Date    Macular edema      PERSISITENT PSEUDOPHAKIC OS    Cataract     Refractive error 03/27/2011  Chronic kidney disease     Varicella     IC (interstitial cystitis)     Vitreous hemorrhage 05/21/2014    PVD (posterior vitreous detachment), right 05/21/2014      Past Surgical History   Procedure Laterality Date    Hx tonsillectomy/adenoidectomy       Tonsillectomy Conversion Data     Cesarean section, classic       Cesarean Section Conversion Data     Bunionectomy       Hallux Valgus (Bunion) Correction Conversion Data     Cataract removal  10/24/09     PC IOL OS    Retinal detachment surgery       X 3    25g ppv/ivt/afx/el/22%sf6 os  05/16/09    Sb/20g ppv/mo/pfc/rtx/afx/15% c3 f8 os  06/20/09    Barrier lpc os  07/30/09    Phaco/iol/25g ppv/pcox/mp/afx/icg/ilmx/14% c3 f8 os  10/24/09    Intra vitreal triesence os  05/30/10     4MG     Avastin #1 os 08/14/11 consent 08/14/11 maxitrol      Barrier laser, right eye  05/20/14     Dr Denyce Robert      History   Smoking status    Former Smoker    Types: Cigarettes    Quit date: 02/15/1981   Smokeless tobacco    Not on file      History   Alcohol Use    Yes      History   Drug Use No      Specialty Problems        Ophthalmology Problems    Cataract        Cystoid macular edema of left eye        Refractive error        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, right        PVD (posterior vitreous detachment), right        Vitreous hemorrhage               ROS     Positive for: Eyes    Negative for: Constitutional, Gastrointestinal, Neurological, Skin,   Genitourinary, Musculoskeletal, HENT, Endocrine, Cardiovascular,   Respiratory, Psychiatric, Allergic/Imm, Heme/Lymph       Objective:   Objective  Filed Vitals:    11/30/14 0942   BP: 126/82   Pulse: 72       Base Eye Exam     Visual Acuity (Snellen - Linear)      Right Left   Dist sc  CF at 3'   Dist cc 20/25 -1        Correction:  Glasses      Tonometry  (Tonopen, 9:50 AM)      Right Left   Pressure 20 20         Pupils      Dark APD   Right 65mm None   Left 3irreg +       Pharm Dilated OD       Neuro/Psych     Oriented x3:  Yes    Mood/Affect:  Normal      Dilation     Left eye:  2.5% Phenylephrine, 1.0% Tropicamide @ 9:50 AM    Pt dilated OU at home prior to visit            Slit Lamp and Fundus Exam     External Exam      Right Left    External Normal Normal  Slit Lamp Exam      Right Left    Lids/Lashes Normal Normal    Conjunctiva/Sclera White and quiet White and quiet    Cornea Clear Clear    Anterior Chamber Deep and quiet Deep and quiet    Iris Round and reactive Round and reactive    Lens 1+ Nuclear sclerosis Posterior chamber intraocular lens; 3+ PCO    Vitreous Posterior vitreous detachment; mild VH clear      Fundus Exam      Right Left    Disc Normal Normal    C/D Ratio 0.3 0.5    Macula Normal SR band, resolved ME    Vessels Normal attenuated    Periphery Multiple tears scattered 360 all lasered with no new heme inf 180 Retx; attached, Laser scar, Macula attached, laser                                     Assessment/Plan:   Assessment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, right    PVD with VH and retinal tears  --s/p barrier LPC OD (05/20/14)  --s/p barrier LPC OD (05/21/14)  --s/p barrier LPC OD (05/24/14)   --doing great, no new heme or tears  --f/u 74m dilate OD  --SSX of RD explained and understood    Chronic pseudophakic ME OS  HX OF RD repair X 3 (3/11), now resolved  Following yearly now    Hx RD repair OS  RD repair X 3 (3/11)  See above    PCIOL OS  -PCO OS, follow for now

## 2014-11-30 NOTE — Assessment & Plan Note (Signed)
PVD with VH and retinal tears  --s/p barrier LPC OD (05/20/14)  --s/p barrier LPC OD (05/21/14)  --s/p barrier LPC OD (05/24/14)   --doing great, no new heme or tears  --f/u 78m dilate OD  --SSX of RD explained and understood    Chronic pseudophakic ME OS  HX OF RD repair X 3 (3/11), now resolved  Following yearly now    Hx RD repair OS  RD repair X 3 (3/11)  See above    PCIOL OS  -PCO OS, follow for now

## 2015-01-18 ENCOUNTER — Encounter: Payer: Self-pay | Admitting: Primary Care

## 2015-02-19 ENCOUNTER — Other Ambulatory Visit: Payer: Self-pay | Admitting: Urology

## 2015-02-19 ENCOUNTER — Ambulatory Visit: Payer: Self-pay | Admitting: Primary Care

## 2015-02-19 ENCOUNTER — Encounter: Payer: Self-pay | Admitting: Primary Care

## 2015-02-19 VITALS — BP 118/80 | HR 66 | Ht 62.0 in | Wt 200.0 lb

## 2015-02-19 DIAGNOSIS — K7581 Nonalcoholic steatohepatitis (NASH): Secondary | ICD-10-CM

## 2015-02-19 DIAGNOSIS — N941 Unspecified dyspareunia: Secondary | ICD-10-CM

## 2015-02-19 DIAGNOSIS — R300 Dysuria: Secondary | ICD-10-CM

## 2015-02-19 DIAGNOSIS — Z139 Encounter for screening, unspecified: Secondary | ICD-10-CM

## 2015-02-19 DIAGNOSIS — L219 Seborrheic dermatitis, unspecified: Secondary | ICD-10-CM

## 2015-02-19 DIAGNOSIS — E559 Vitamin D deficiency, unspecified: Secondary | ICD-10-CM

## 2015-02-19 LAB — URINALYSIS WITH REFLEX TO MICROSCOPIC
Blood,UA: NEGATIVE
Ketones, UA: NEGATIVE
Nitrite,UA: POSITIVE — AB
Protein,UA: NEGATIVE mg/dL
Specific Gravity,UA: 1.017 (ref 1.002–1.030)
pH,UA: 5 (ref 5.0–8.0)

## 2015-02-19 LAB — URINE MICROSCOPIC (IQ200)
RBC,UA: 1 /hpf (ref 0–2)
WBC,UA: 2 /hpf (ref 0–5)

## 2015-02-19 LAB — POCT URINALYSIS DIPSTICK
Bilirubin,Ur: NEGATIVE
Blood,UA POCT: NEGATIVE
Glucose,UA POCT: NORMAL
Ketones,UA POCT: NEGATIVE
Leuk Esterase,UA POCT: NEGATIVE
Lot #: 20339201
Nitrite,UA POCT: NEGATIVE
PH,UA POCT: 5 (ref 5–8)
Protein,UA POCT: NEGATIVE mg/dL
Specific gravity,UA POCT: 1.02 (ref 1.002–1.03)
Urobilinogen,UA: NORMAL

## 2015-02-19 MED ORDER — ESTRADIOL 0.1 MG/GM VA CREA *I*
1.0000 g | TOPICAL_CREAM | Freq: Every evening | VAGINAL | 5 refills | Status: DC
Start: 2015-02-19 — End: 2015-08-12

## 2015-02-19 MED ORDER — TOLTERODINE TARTRATE 4 MG PO CP24 *I*
4.0000 mg | ORAL_CAPSULE | Freq: Every day | ORAL | 3 refills | Status: DC
Start: 2015-02-19 — End: 2016-03-31

## 2015-02-19 NOTE — Progress Notes (Signed)
Veronica Bishop is a 59 y.o. year who has these acute problems:      Veronica Bishop has dysuria again and is having some vaginal discharge.    She has a rash behind her ear.  She doesn't remember what she was supposed to treat it with.  She wants to talk about weight.    And is here to follow up several chronic problems:    She has a history of vitamin D deficiency.    DYSLIPIDEMIA   She is exercising by swimming, she did not have labs drawn.    Patients meds and allergies are reviewed today and there are no changes in family history.  See electronic record for details.      Visit Vitals    BP 118/80 (BP Location: Left arm, Patient Position: Sitting, Cuff Size: adult)    Pulse 66    Ht 1.575 m (5\' 2" )    Wt 90.7 kg (200 lb)    SpO2 96%    BMI 36.58 kg/m2   GENERAL APPEARANCE: appears stated age, well appearing, no apparent distress.  EYES: conjunctiva benign, anicteric  MOUTH: wet, no exudate, oropharynx clear.  NECK: supple, no carotid bruits, no unusual lymphadenopathy, no thyromegaly or mass  LUNGS: Clear to auscultation, no wheezing or crackles. No retractions.  HEART: regular rhythm and rate with no murmurs, JVP flat  ABDOMEN: soft, nontender, without hepatosplenomegaly or masses.  BACK: no CVA tenderness  EXTREMITIES: Without clubbing, cyanosis, or edema  NEUROLOGIC: Alert and oriented times three, cranial nerves II through XII intact by observation, motor/sensory exam appear normal, normal gait.  SKIN:  no rash; greasy yellow crusting behind both ears  PSYCH: cheerful, animated, well groomed, good eye contact.    DISCUSSION 59 y.o. year old female with     DYSURIA  Urine looks normal.  Culture pending.  She has a history of interstitial cystitis.  Culture for BV, candida by self swab    DYSPAREUNIA  Estrace three times a week    SEBORRHEIC DERMATITIS  Hydrocortisone behind ears.     DYSLIPIDEMIA   Lab Results   Component Value Date    LDLC 117 09/10/2014      --According to ATP III guidelines   LDL at goal  Based on risk profile and co-morbidities   LDL goal is 130   Plan to reach goal includes:   --Lifestyle Modifications;   --discussed low carbohydrate diet   --Medication Management: no changes made   --Follow up in 12 months    VITAMIN  D DEFICIENCY  Recheck .    HCM  Immunization History   Administered Date(s) Administered    Influenza Injectable Quadrivalent with preservati 08/21/2013    Tdap 09/10/2010    Zoster 10/22/2013     Health Maintenance   Topic Date Due    COLON CANCER SCREENING 10 YEAR COLONOSCOPY  08/26/2013    CERVICAL CANCER SCREEN PAP EVERY 1 YEAR  09/26/2015    BREAST CANCER SCREENING  09/26/2016    HIV TESTING OFFERED  Addressed    IMM-INFLUENZA  Completed    HEPATITIS C SCREENING OFFERED  Completed       RECOMMENDED colonoscopy    Follow up in six months .

## 2015-02-19 NOTE — Patient Instructions (Signed)
Seborrheic dermatitis - hydrocortisone

## 2015-02-20 ENCOUNTER — Telehealth: Payer: Self-pay | Admitting: Primary Care

## 2015-02-20 ENCOUNTER — Other Ambulatory Visit: Payer: Self-pay | Admitting: Primary Care

## 2015-02-20 LAB — VAGINITIS SCREEN: DNA PROBE
Vaginitis Screen:DNA Probe: POSITIVE
Vaginitis Screen:DNA Probe: POSITIVE

## 2015-02-20 MED ORDER — FLUCONAZOLE 200 MG PO TABS *I*
ORAL_TABLET | ORAL | 1 refills | Status: DC
Start: 2015-02-20 — End: 2015-08-12

## 2015-02-20 NOTE — Telephone Encounter (Signed)
Fluconazole sent in °

## 2015-02-20 NOTE — Telephone Encounter (Signed)
Let Veronica Bishop know she has BOTH yeast and BV on her culture.  Is she more itchy or stinky?  That's how I will know which med to use.

## 2015-02-20 NOTE — Telephone Encounter (Signed)
Pt notified of below. She states she is more itchy.  Pharmacy confirmed.

## 2015-06-01 ENCOUNTER — Other Ambulatory Visit: Payer: Self-pay | Admitting: Urology

## 2015-06-11 ENCOUNTER — Encounter: Payer: Self-pay | Admitting: Retina Ophthalmology

## 2015-06-11 ENCOUNTER — Ambulatory Visit: Payer: Self-pay | Admitting: Retina Ophthalmology

## 2015-06-11 DIAGNOSIS — H33311 Horseshoe tear of retina without detachment, right eye: Secondary | ICD-10-CM

## 2015-06-11 NOTE — Progress Notes (Signed)
Subjective:   Subjective 06/11/2015   Chief Complaint   Patient presents with    Blurred Vision     HPI     6 Month Follow Up    PVD with VH and retinal tears   --s/p barrier LPC OD (05/20/14)   --s/p barrier LPC OD (05/21/14)   --s/p barrier LPC OD (05/24/14)   Chronic pseudophakic ME OS   Hx RD repair OS   --RD repair x3 (3/11)   --see above   PCIOL OS   --PCO OS   Lattice OD    Pt states vision is stable. Longstanding floaters OD that remain constant.   No flashes. No eye pain. No gtts.        Last edited by Johnell Comings, MD on 06/11/2015  8:45 AM.       Current Outpatient Prescriptions:     cephalexin (KEFLEX) 500 MG capsule, TAKE ONE CAPSULE BY MOUTH DAILY AS NEEDED WITH INTERCOURSE, Disp: 15 capsule, Rfl: 1    fluconazole (DIFLUCAN) 200 MG tablet, One a day for three days, Disp: 3 tablet, Rfl: 1    estradiol (ESTRACE VAGINAL) 0.1 MG/GM vaginal cream, Place 1 g vaginally nightly, Disp: 42.5 g, Rfl: 5    tolterodine (DETROL LA) 4 MG 24 hr capsule, Take 1 capsule (4 mg total) by mouth daily   Swallow whole. Do not crush or chew., Disp: 90 capsule, Rfl: 3    olopatadine (PATADAY) 0.2 % ophthalmic solution, Place 1 drop into both eyes daily, Disp: , Rfl:     cephalexin (KEFLEX) 250 MG capsule, One by mouth prn intercourse, Disp: 30 capsule, Rfl: 5    tamsulosin (FLOMAX) 0.4 MG, TAKE 1 CAPSULE (0.4 MG TOTAL) BY MOUTH EVERY EVENING, Disp: 90 capsule, Rfl: 3    naproxen sodium (ANAPROX) 220 MG tablet, Take 220 mg by mouth daily, Disp: , Rfl:     aspirin 81 MG tablet, Take 81 mg by mouth daily, Disp: , Rfl:     EPINEPHrine (EPIPEN) 0.3 MG/0.3ML SOAJ injection, INJECT 0.3ML INTRAMUSCULARLY AS DIRECTED, Disp: 1 Device, Rfl: 5  Sulfa antibiotics and No known latex allergy   No birth history on file.  Past Medical History   Diagnosis Date    Cataract     Chronic kidney disease     IC (interstitial cystitis)     Macular edema      PERSISITENT PSEUDOPHAKIC OS    PVD (posterior vitreous detachment), right  05/21/2014    Refractive error 03/27/2011    Varicella     Vitreous hemorrhage 05/21/2014      Past Surgical History   Procedure Laterality Date    Hx tonsillectomy/adenoidectomy       Tonsillectomy Conversion Data     Cesarean section, classic       Cesarean Section Conversion Data     Bunionectomy       Hallux Valgus (Bunion) Correction Conversion Data     Cataract removal  10/24/09     PC IOL OS    Retinal detachment surgery       X 3    25g ppv/ivt/afx/el/22%sf6 os  05/16/09    Sb/20g ppv/mo/pfc/rtx/afx/15% c3 f8 os  06/20/09    Barrier lpc os  07/30/09    Phaco/iol/25g ppv/pcox/mp/afx/icg/ilmx/14% c3 f8 os  10/24/09    Intra vitreal triesence os  05/30/10     4MG     Avastin #1 os 08/14/11 consent 08/14/11 maxitrol      Barrier laser, right eye  05/20/14     Dr Denyce Robert      History   Smoking Status    Former Smoker    Types: Cigarettes    Quit date: 02/15/1981   Smokeless Tobacco    Not on file      History   Alcohol Use    Yes      History   Drug Use No      Specialty Problems        Ophthalmology Problems    Cataract        Cystoid macular edema of left eye        Refractive error        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, right        PVD (posterior vitreous detachment), right        Vitreous hemorrhage                  Objective:   Objective   Vitals:    06/11/15 0829   BP: 119/75   Pulse: 71       Base Eye Exam     Visual Acuity (Snellen - Linear)      Right Left   Dist cc 20/20 -1 CF at 3'         Tonometry (Tonopen, 8:36 AM)      Right Left   Pressure 18 17         Pupils      APD   Right None   Left +APD          Extraocular Movement      Right Left   Result Full Full         Neuro/Psych     Oriented x3:  Yes    Mood/Affect:  Normal      Dilation     Both eyes:  2.5% Phenylephrine, 1.0% Tropicamide @ 8:35 AM            Slit Lamp and Fundus Exam     External Exam      Right Left    External Normal Normal      Slit Lamp Exam      Right Left    Lids/Lashes Normal Normal     Conjunctiva/Sclera White and quiet White and quiet    Cornea Clear Clear    Anterior Chamber Deep and quiet Deep and quiet    Iris Round and reactive Round and reactive    Lens 1+ Nuclear sclerosis Posterior chamber intraocular lens; 3+ PCO    Vitreous Posterior vitreous detachment; mild VH clear      Fundus Exam      Right Left    Disc Normal Normal    C/D Ratio 0.3 0.5    Macula Normal SR band, resolved ME    Vessels Normal attenuated    Periphery Multiple tears scattered 360 all lasered with no new heme inf 180 Retx; attached, Laser scar, Macula attached, laser            Refraction     Wearing Rx      Sphere Cylinder Axis Add   Right -0.75 +0.75 142 +2.50   Left +2.50  180 +2.00                                        Assessment/Plan:   Assessment 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,  right    PVD with VH and retinal tears  --s/p barrier LPC OD (05/20/14)  --s/p barrier LPC OD (05/21/14)  --s/p barrier LPC OD (05/24/14)   --doing great, no new heme or tears  --f/u 1 yr OCT OU, dialted OU  --SSX of RD explained and understood    Chronic pseudophakic ME OS  HX OF RD repair X 3 (3/11), now resolved  Following yearly now    Hx RD repair OS  RD repair X 3 (3/11)  See above    PCIOL OS  -PCO OS, follow for now

## 2015-06-11 NOTE — Assessment & Plan Note (Signed)
PVD with VH and retinal tears  --s/p barrier LPC OD (05/20/14)  --s/p barrier LPC OD (05/21/14)  --s/p barrier LPC OD (05/24/14)   --doing great, no new heme or tears  --f/u 1 yr OCT OU, dialted OU  --SSX of RD explained and understood    Chronic pseudophakic ME OS  HX OF RD repair X 3 (3/11), now resolved  Following yearly now    Hx RD repair OS  RD repair X 3 (3/11)  See above    PCIOL OS  -PCO OS, follow for now

## 2015-08-12 ENCOUNTER — Ambulatory Visit: Payer: Self-pay | Admitting: Urology

## 2015-08-12 ENCOUNTER — Encounter: Payer: Self-pay | Admitting: Urology

## 2015-08-12 VITALS — BP 155/80 | HR 72 | Ht 62.0 in | Wt 195.0 lb

## 2015-08-12 DIAGNOSIS — R339 Retention of urine, unspecified: Secondary | ICD-10-CM

## 2015-08-12 DIAGNOSIS — N3281 Overactive bladder: Secondary | ICD-10-CM

## 2015-08-12 DIAGNOSIS — N390 Urinary tract infection, site not specified: Secondary | ICD-10-CM

## 2015-08-12 DIAGNOSIS — N3010 Interstitial cystitis (chronic) without hematuria: Secondary | ICD-10-CM

## 2015-08-12 LAB — POCT URINALYSIS DIPSTICK
Glucose,UA POCT: NORMAL
Ketones,UA POCT: NEGATIVE
Leuk Esterase,UA POCT: NEGATIVE
Lot #: 16608901
Nitrite,UA POCT: NEGATIVE
PH,UA POCT: 5 (ref 5–8)
Protein,UA POCT: NEGATIVE mg/dL
Specific gravity,UA POCT: 1.015 (ref 1.002–1.03)

## 2015-08-12 NOTE — Progress Notes (Signed)
Visit Diagnosis(es) or CC: UTIs, OAB, IC, Elevated PVR    HPI:  The patient comes to the office today in follow up for the above diagnosis (-es).  She took Xanax today, anticipating she might require cystoscopy, so her daughter accompanied her.     OAB/IC  She is on Detrol LA 4 once daily at this point, along with flomax.  The Detrol is no longer as effective as it had been.  She retried Myrbetriq.  "It think the Detrol is better."  She is having more intense urgency.    "I really notice it when I drive."  She wants to stay with Detrol.        UTIs  She has had no UTIs since her last visit.  She has no current symptoms.  She is still using keflex just after intercourse only.    She is content with that.    Cystoscopy on 01-08-14 was normal.      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  Eyes: Denies change in vision.  ENT: Denies hearing problems, or loss. Denies swollen glands or stiff neck.  Chest: Denies recent cold, flu or upper respiratory illness. Denies cough or dyspnea.  Heart: Denies recent "heart trouble," chest pain, or palpitations.  GI: Denies constipation, diarrhea, acid reflux or bloody stool.  GU: Denies urogenital complaints, except as outlined in the HPI.  Musculoskeletal: Denies any muscle or joint pain, stiffness, or progressive arthritis.         Objective:    GENERAL:  No acute distress.  Well developed, well nourished.  NEUROLOGIC:  Oriented to person, place, time, and situation  PSYCHIATRIC:  Normal mood and affect  HEENT:  NC/AT.  Sclerae anicteric.  Nose/mouth/ears without obvious lesions.  NECK:  Trachea midline.  No JVD in sitting position.    SKIN:  Normal color, turgor, texture, hydration  LUNGS:  Respirations unlabored.    HEART:  Regular rate and rhythm  BACK:  No CVAT  ABD:  Soft, NT/ND, no masses or bladder  distention  EXTR:  No calf swelling or tenderness      PVR:   166 ccs    CF 41 ccs    CF 150 ccs    CF Not assessed today    CF 42 ccs    CF 102 ccs      Renal Ultrasound:   12-28-13:  IMPRESSION:  No evidence of hydronephrosis or focal lesions. Incidentally noted   Hepatic steatosis.  END OF IMPRESSION.    Culture History:    Results for REX, HENSEN (MRN [id number]) as of 08/12/2015 16:52   Ref. Range 08/13/2014 16:45 09/10/2014 16:00 10/08/2014 16:00 02/19/2015 23:30 02/19/2015 23:30   Aerobic Culture Unknown Escherichia coli Escherichia coli Escherichia coli     Vaginitis Screen:DNA Probe Unknown   Positive for Gard... Positive for Cand... Positive for [place]...             ASSESSMENT:   UTIs    No recurrent infections    No current symptoms    Pleased with Keflex after intercourse    She wants to stay with that   OAB/IC    Well compensated at present with Detrol LA4  We reviewed other options   Elevated PVR    Varying      PLAN:   Check urine culture   Continue Detrol and flomax    Antibiotics on hand for travel      FOLLOW-UP:   Six months rather than one year, given her residual today    LOS:      [id number], [id number]  C    Time spent in counseling and coordination of care (if used for LOS):  15 minute face-to-face visit with >75% of that spent in counseling and coordination of care.

## 2015-08-13 ENCOUNTER — Encounter: Payer: Self-pay | Admitting: Urology

## 2015-08-14 ENCOUNTER — Ambulatory Visit: Payer: Self-pay | Admitting: Optometry

## 2015-08-14 LAB — AEROBIC CULTURE

## 2015-09-06 ENCOUNTER — Other Ambulatory Visit: Payer: Self-pay | Admitting: Urology

## 2015-09-09 ENCOUNTER — Other Ambulatory Visit: Payer: Self-pay | Admitting: Urology

## 2015-09-09 MED ORDER — TAMSULOSIN HCL 0.4 MG PO CAPS *I*
0.4000 mg | ORAL_CAPSULE | Freq: Every evening | ORAL | 3 refills | Status: DC
Start: 2015-09-09 — End: 2016-12-18

## 2015-09-18 ENCOUNTER — Telehealth: Payer: Self-pay | Admitting: Primary Care

## 2015-09-18 NOTE — Telephone Encounter (Signed)
Patient called has had a stye on her left eye lid for almost a week. Has not tried any home remedies she will try the warm compress today if not any better she will schedule appt Friday as she is going on a business trip Monday and wants this better .  Are you OK with that ?

## 2015-09-18 NOTE — Telephone Encounter (Signed)
She can but there is nothing that can be done to make it better but surgery and that will not be done until she has had it for many weeks.

## 2015-09-18 NOTE — Telephone Encounter (Signed)
Ok

## 2015-10-02 ENCOUNTER — Other Ambulatory Visit: Payer: Self-pay | Admitting: Gastroenterology

## 2015-10-06 ENCOUNTER — Other Ambulatory Visit: Payer: Self-pay | Admitting: Primary Care

## 2016-02-07 ENCOUNTER — Telehealth: Payer: Self-pay | Admitting: Urology

## 2016-02-07 NOTE — Telephone Encounter (Signed)
Ms. Pesola is calling to cancel her appointment which is currently scheduled for 02/10/16.    Has the appointment been cancelled? yes    Has the appointment been rescheduled? no    Does the patient need a call back to reschedule?yes Dr. Vernon Prey next available is 10/25. She is supposed to be seen for a 6 month fuv.    Patient can be contacted back at [phone]    Per message by Juliann Pulse

## 2016-02-07 NOTE — Telephone Encounter (Signed)
Called patient, offered appt with PA.  LM

## 2016-02-10 ENCOUNTER — Ambulatory Visit: Payer: Self-pay | Admitting: Urology

## 2016-03-12 ENCOUNTER — Encounter: Payer: Self-pay | Admitting: Primary Care

## 2016-03-12 ENCOUNTER — Ambulatory Visit: Payer: BLUE CROSS/BLUE SHIELD | Attending: Primary Care | Admitting: Primary Care

## 2016-03-12 VITALS — BP 120/70 | HR 70 | Temp 97.5°F | Ht 62.0 in | Wt 203.5 lb

## 2016-03-12 DIAGNOSIS — Z139 Encounter for screening, unspecified: Secondary | ICD-10-CM

## 2016-03-12 DIAGNOSIS — J309 Allergic rhinitis, unspecified: Secondary | ICD-10-CM

## 2016-03-12 MED ORDER — PROMETHAZINE-CODEINE 6.25-10 MG/5ML PO SYRP *A*
ORAL_SOLUTION | ORAL | 0 refills | Status: AC
Start: 2016-03-12 — End: 2016-03-26

## 2016-03-12 MED ORDER — MOMETASONE FUROATE 50 MCG/ACT NA SUSP *I*
2.0000 | Freq: Every day | NASAL | 5 refills | Status: DC
Start: 2016-03-12 — End: 2020-08-13

## 2016-03-12 NOTE — Progress Notes (Signed)
Devlyn Khatoon Mask is a 60 y.o. year old who has been ill for one weeks.  She has  had a cough and runny/stuffy nose.  She has had itchy eyes but not nose throat or ears.  She has had no fever and no  chills.  This was not measured .   She has no myalgia.     She has had sore throat.  She is hoarse.  She has headache and no earache.  Berthine has had no nausea, vomiting or no diarrhea.  She has been eating as well and has not been sleeping as well as usual due to cough.   She has been taking patady with some symptom improvement.  She does not smoke.  Taska Mercuri Rumler has traveled recently for work to [employer].  She is not exposed to anyone with illness.    Patients meds and allergies are reviewed today and there are no changes in family history.  See electronic record for details.    Visit Vitals    BP 120/70    Pulse 70    Temp 36.4 C (97.5 F) (Temporal)    Ht 1.575 m (5\' 2" )    Wt 92.3 kg (203 lb 8 oz)    SpO2 96%    BMI 37.22 kg/m2       GENERAL APPEARANCE: appears stated age, TIRED  EYES: conjunctiva benign   EARS: canals normal TM gray and shiny  MOUTH: wet, no exudate, oropharynx clear. Nose STUFFY  NECK: supple, 2 to 2 cm adenapathy in the anterior cervical chain  LUNGS: Clear to auscultation, no WHEEZING or crackles. No retractions.    HEART: regular rhythm and rate with no murmurs, JVP flat  EXTREMITIES: Without clubbing, cyanosis, or edema  NEUROLOGIC: Alert and oriented times three, cranial nerves II through XII intact by observation, motor/sensory exam appear normal, normal gait.  SKIN:  no rash  PSYCH: alert, well groomed, good eye contact.    DISCUSSION  60 y.o. year old with nonfocal complaints and exam    VIRAL SYNDROME vs. ALLERGIES  TREAT with NASAL STEROID    Nighttime cough - Promethazine with codeine 1 to 2 teas every four to six hours while trying to sleep. (pt informed that this medicine can cause addiction, constipation, sleepiness)    DISCOMFORT  Ibuprofen 800 tid as needed  or  Tylenol 500 mg two every five hours as needed    Follow-up immediately for increased work of breathing, being out of it, excessive pain or if without gradual improvement after 7 days.

## 2016-03-31 ENCOUNTER — Other Ambulatory Visit: Payer: Self-pay | Admitting: Urology

## 2016-06-12 ENCOUNTER — Ambulatory Visit: Payer: Self-pay | Admitting: Retina Ophthalmology

## 2016-06-30 ENCOUNTER — Encounter: Payer: Self-pay | Admitting: Retina Ophthalmology

## 2016-06-30 ENCOUNTER — Ambulatory Visit: Payer: Self-pay | Admitting: Retina Ophthalmology

## 2016-06-30 DIAGNOSIS — H33311 Horseshoe tear of retina without detachment, right eye: Secondary | ICD-10-CM

## 2016-06-30 NOTE — Assessment & Plan Note (Signed)
PVD with VH and retinal tears  --s/p barrier LPC OD (05/20/14)  --s/p barrier LPC OD (05/21/14)  --s/p barrier LPC OD (05/24/14)   --doing great, no new heme or tears  --f/u 1 yr OCT OU, dialted OU  --SSX of RD explained and understood    Chronic pseudophakic ME OS  HX OF RD repair X 3 (3/11), now resolved  Following yearly now    Hx RD repair OS  RD repair X 3 (3/11)  See above    PCIOL OS  -PCO OS, follow for now

## 2016-06-30 NOTE — Progress Notes (Signed)
Subjective:   Subjective 06/30/2016   Chief Complaint   Patient presents with    Blurred Vision     HPI     1 Year Follow Up     PVD with VH and retinal tears   --s/p barrier LPC OD (05/20/14)   --s/p barrier LPC OD (05/21/14)   --s/p barrier LPC OD (05/24/14)   Chronic pseudophakic ME OS   Hx RD repair OS   --RD repair x3 (3/11)   --see above   PCIOL OS   --PCO OS   Lattice OD     Vision is the same from last year.          Last edited by Johnell Comings, MD on 06/30/2016  9:05 AM.       Current Outpatient Prescriptions:     tolterodine (DETROL LA) 4 MG 24 hr capsule, TAKE 1 CAPSULE (4 MG TOTAL) BY MOUTH DAILY SWALLOW WHOLE. DO NOT CRUSH OR CHEW., Disp: 90 capsule, Rfl: 2    PATADAY 0.2 % ophthalmic solution, INSTIL 1 DROP EVERY DAY INTO BOTH EYES, Disp: , Rfl: 6    mometasone (NASONEX) 50 MCG/ACT nasal spray, 2 sprays by Each Nare route daily, Disp: 17 g, Rfl: 5    tamsulosin (FLOMAX) 0.4 MG capsule, Take 1 capsule (0.4 mg total) by mouth every evening, Disp: 90 capsule, Rfl: 3    cephalexin (KEFLEX) 500 MG capsule, TAKE ONE CAPSULE BY MOUTH DAILY AS NEEDED WITH INTERCOURSE, Disp: 15 capsule, Rfl: 1    naproxen sodium (ANAPROX) 220 MG tablet, Take 220 mg by mouth daily, Disp: , Rfl:     aspirin 81 MG tablet, Take 81 mg by mouth daily, Disp: , Rfl:     EPINEPHrine (EPIPEN) 0.3 MG/0.3ML SOAJ injection, INJECT 0.3ML INTRAMUSCULARLY AS DIRECTED, Disp: 1 Device, Rfl: 5  Sulfa antibiotics and No known latex allergy   No birth history on file.  Past Medical History:   Diagnosis Date    Cataract     Chronic kidney disease     IC (interstitial cystitis)     Macular edema     PERSISITENT PSEUDOPHAKIC OS    PVD (posterior vitreous detachment), right 05/21/2014    Refractive error 03/27/2011    Varicella     Vitreous hemorrhage 05/21/2014      Past Surgical History:   Procedure Laterality Date    25G PPV/IVT/AFX/EL/22%SF6 OS  05/16/09    Avastin #1 OS 08/14/11 consent 08/14/11 maxitrol      barrier laser, right eye   05/20/14    Dr Denyce Robert    BARRIER LPC OS  07/30/09    BUNIONECTOMY      Hallux Valgus (Bunion) Correction Conversion Data     CATARACT REMOVAL  10/24/09    PC IOL OS    CESAREAN SECTION, CLASSIC      Cesarean Section Conversion Data     HX TONSILLECTOMY/ADENOIDECTOMY      Tonsillectomy Conversion Data     INTRA VITREAL TRIESENCE OS  05/30/10    4MG     PHACO/IOL/25G PPV/PCOX/MP/AFX/ICG/ILMX/14% C3 F8 OS  10/24/09    RETINAL DETACHMENT SURGERY      X 3    SB/20G PPV/MO/PFC/RTX/AFX/15% C3 F8 OS  06/20/09      History   Smoking Status    Former Smoker    Types: Cigarettes    Quit date: 02/15/1981   Smokeless Tobacco    Never Used      History   Alcohol Use  Yes      History   Drug Use No      Specialty Problems        Ophthalmology Problems    Cataract        Cystoid macular edema of left eye        Refractive error        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 (History)       Objective:   Objective   Vitals:    06/30/16 0802   BP: 135/81   Pulse: 78   Weight: 92.1 kg (203 lb)   Height: 1.575 m (5\' 2" )       Base Eye Exam     Visual Acuity (Snellen - Linear)      Right Left   Dist sc  20/400   Dist cc 20/20 -1    Dist ph sc  NI       Correction:  Glasses      Tonometry (Tonopen, 8:27 AM)      Right Left   Pressure 20 19         Pupils      Pupils APD   Right PERRLA None   Left PERRLA + APD          Neuro/Psych     Oriented x3:  Yes    Mood/Affect:  Normal      Dilation     Both eyes:  2.5% Phenylephrine, 1.0% Tropicamide @ 8:25 AM            Slit Lamp and Fundus Exam     External Exam      Right Left    External Normal Normal         Slit Lamp Exam      Right Left    Lids/Lashes Normal Normal    Conjunctiva/Sclera White and quiet White and quiet    Cornea Clear Clear    Anterior Chamber Deep and quiet Deep and quiet    Iris Round and reactive Round and reactive    Lens 1+ Nuclear sclerosis Posterior chamber intraocular lens; 3+ PCO    Vitreous Posterior vitreous detachment; mild VH clear      Fundus Exam      Right Left    Disc Normal Normal    C/D Ratio 0.3 0.5    Macula Normal SR band, resolved ME    Vessels Normal attenuated    Periphery Multiple tears scattered 360 all lasered with no new heme inf 180 Retx; attached, Laser scar, Macula attached, laser            Refraction     Wearing Rx      Sphere Cylinder Axis Add   Right -0.75 +0.75 142 +2.50   Left +2.50  180 +2.00                                        Assessment/Plan:   Assessment  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

## 2016-07-27 ENCOUNTER — Encounter: Payer: Self-pay | Admitting: Gastroenterology

## 2016-07-27 LAB — HM COLONOSCOPY

## 2016-10-01 ENCOUNTER — Other Ambulatory Visit: Payer: Self-pay | Admitting: Gastroenterology

## 2016-12-03 ENCOUNTER — Telehealth: Payer: Self-pay | Admitting: Urology

## 2016-12-03 ENCOUNTER — Other Ambulatory Visit
Admission: RE | Admit: 2016-12-03 | Discharge: 2016-12-03 | Disposition: A | Discharge status: Home / Self Care | Payer: No Typology Code available for payment source | Source: Ambulatory Visit | Attending: Urology | Admitting: Urology

## 2016-12-03 DIAGNOSIS — N390 Urinary tract infection, site not specified: Secondary | ICD-10-CM

## 2016-12-03 LAB — URINALYSIS WITH MICROSCOPIC
Blood,UA: NEGATIVE
Ketones, UA: NEGATIVE
Nitrite,UA: NEGATIVE
Protein,UA: NEGATIVE mg/dL
RBC,UA: 1 /hpf (ref 0–2)
Specific Gravity,UA: 1.011 (ref 1.002–1.030)
WBC,UA: 50 /hpf — ABNORMAL HIGH (ref 0–5)
pH,UA: 5 (ref 5.0–8.0)

## 2016-12-03 NOTE — Telephone Encounter (Signed)
Patient is requesting an order be placed for a urine sample. She states she has all the classic symptoms of a UTI. She also states she is having trouble emptying her bladder. Veronica Bishop would like to be seen as soon as possible at either office. She states her bladder is in pain (4/10)    Veronica Bishop can be reached at [phone]

## 2016-12-03 NOTE — Telephone Encounter (Signed)
Left message requesting Ms. Shimon to call office back. [facility] - please schedule patient per message below.

## 2016-12-03 NOTE — Telephone Encounter (Signed)
[employer], broadcasting/film/video,   Please contact the patient to schedule the next available follow up appointment with Dr. Vernon Prey or Natasha Mead, PA.   Okay to be seen at either office.     Spoke with Romie Minus. Orders entered for a urine culture and urinalysis.   Last seen 08/12/2015 with the plan below.   The 02/10/2016 appointment was canceled.     PLAN:                        Check urine culture                        Continue Detrol and flomax                         Antibiotics on hand for travel      FOLLOW-UP:                        Six months rather than one year, given her residual today

## 2016-12-04 LAB — AEROBIC CULTURE: Aerobic Culture: 0

## 2016-12-04 NOTE — Telephone Encounter (Signed)
Patient called writer attempted to schedule appointment as instructed below. Patient states she is currently in pain with discomfort & pain level is 4-5/10. Patient not worried about scheduling FUV at this time, but wants a nurse to call her back with her urine test results. States she does not want to go into the weekend with this issus. Writer advised that all results are not in, but patient demands a call back before the end of the day with the results.      Patient can be reached at [phone].

## 2016-12-04 NOTE — Telephone Encounter (Signed)
Spoke to Veronica Bishop and scheduled with Veronica Bishop on 12/25/16 at [facility]. Advised that if she needs her records she will just need to sign a release of information which can be mailed to her or done in the office. Patient will take a few days to decide.

## 2016-12-04 NOTE — Telephone Encounter (Signed)
Called patient and connection still spotty, was not able to hear everything, did inform that message sent to provider.     Staff,   Patient asking for a sooner follow up appointment than July  Patient agreeable to see Linna Hoff  Asking how to get her records   Please call Thanks [facility].

## 2016-12-04 NOTE — Telephone Encounter (Signed)
Dr. Vernon Prey,   Patient complaining of 5/10 burning with urination,   Increased Frequency, urgency   Denies fever, chills, hematuria, or constipation.   Trying to push fluids as much as she can  Urinalysis shows WBC = 50  Culture still pending  Patient asking for treatment prior to weekend  Thanks Veronica Bishop.

## 2016-12-04 NOTE — Telephone Encounter (Signed)
Patient calling back to speak with Lorn Junes, RN. Patient changed phones. Please call patient back at [phone] as soon as possible.

## 2016-12-14 ENCOUNTER — Other Ambulatory Visit: Payer: Self-pay | Admitting: Urology

## 2016-12-18 ENCOUNTER — Other Ambulatory Visit: Payer: Self-pay | Admitting: Urology

## 2016-12-18 MED ORDER — TAMSULOSIN HCL 0.4 MG PO CAPS *I*
0.4000 mg | ORAL_CAPSULE | Freq: Every evening | ORAL | 3 refills | Status: DC
Start: 2016-12-18 — End: 2017-07-09

## 2016-12-18 NOTE — Telephone Encounter (Signed)
Pt has f/u appt 12/25/16

## 2016-12-24 ENCOUNTER — Ambulatory Visit: Payer: BLUE CROSS/BLUE SHIELD | Attending: Urology | Admitting: Urology

## 2016-12-24 ENCOUNTER — Ambulatory Visit: Payer: BLUE CROSS/BLUE SHIELD | Admitting: Urology

## 2016-12-24 ENCOUNTER — Encounter: Payer: Self-pay | Admitting: Urology

## 2016-12-24 VITALS — BP 129/84 | HR 73 | Ht 62.0 in | Wt 187.0 lb

## 2016-12-24 DIAGNOSIS — R102 Pelvic and perineal pain: Secondary | ICD-10-CM

## 2016-12-24 DIAGNOSIS — R3989 Other symptoms and signs involving the genitourinary system: Secondary | ICD-10-CM | POA: Insufficient documentation

## 2016-12-24 DIAGNOSIS — N3281 Overactive bladder: Secondary | ICD-10-CM | POA: Insufficient documentation

## 2016-12-24 DIAGNOSIS — N952 Postmenopausal atrophic vaginitis: Secondary | ICD-10-CM | POA: Insufficient documentation

## 2016-12-24 DIAGNOSIS — N3010 Interstitial cystitis (chronic) without hematuria: Secondary | ICD-10-CM | POA: Insufficient documentation

## 2016-12-24 LAB — POCT URINALYSIS DIPSTICK
Blood,UA POCT: NEGATIVE
Glucose,UA POCT: NORMAL mg/dL
Ketones,UA POCT: NEGATIVE mg/dL
Leuk Esterase,UA POCT: NEGATIVE
Lot #: 26976003
Nitrite,UA POCT: NEGATIVE
PH,UA POCT: 5 (ref 5–8)
Protein,UA POCT: NEGATIVE mg/dL
Specific gravity,UA POCT: 1.02 (ref 1.002–1.030)

## 2016-12-24 LAB — POCT BLADDER SCAN PVR

## 2016-12-24 NOTE — Patient Instructions (Addendum)
Luvena or Replens are over-the-counter vaginal creams that may help with the burning. You should be able to find these at your local pharmacy. Use as directed on the label.     If that is not helpful then call and we can consider a month of an Estrace cream to the vagina.      DIETARY/ BEHAVIORAL RECOMMENDATIONS TO HELP MAINTAIN URINARY TRACT HEALTH    PROPER FLUID INTAKE   Drink approximately 12 cups of fluid per day (1 cup = 8 oz).    Try to get approximately 16 ounces within the first 30 minutes you are awake.   Any non-caffeinated/non-alcoholic fluid is OK  (water, juice, milk, lemonade, etc.)   However, some acidic beverages and foods can also be irritants.   Helps dilute urine to ease irritation, improving pain, urgency and spasms.   Helps treat and prevent urinary tract infections.   Improve flow of your urine stream.    Avoid Caffeine and Alcohol   Caffeine is commonly found in coffee, tea, soda and chocolate.   Alcoholic beverages include beer, wine, and liquor.   Caffeine and alcohol are both diuretics (making the kidney product more urine) and stimulants to the bladder.  For men they are irritants to the prostate as well.   Even decaffeinated coffee, tea, sodas, and artifical sweeteners can act as irritants.    Bland Diet   Foods that are spicy or acidic may also cause bladder irritation.   There are many other foods that could irritate the bladder. If you keep a diary of when you have a irritative urinary symptoms and also write down what you have had to ear or drink during the previous couple of meals, you may find a correlation.    Avoid late evening fluid intake   The more you drink before bed the more likely you will need to urinate at night.   Generally, 2 hours before bed is enough time,  Sips between then are OK.    Double Urinating (for those who retain urine in the bladder)   After urinating, leave the bathroom and go back to try and urinate again 10-15 minutes later.   DO NOT  strain.  If you cannot go the second time, try again later.    Timed urinating   Try to urinate about every 2-3 hrs during the day.  This can help avoid urgency and incontinence by keeping lower bladder volumes.   This will also help in the case of urinary retention.      Specialty pelvic floor Physical Therapist contact information    Call to make an appointment to address your pelvic and bladder pain.    Sandre Kitty, [facility]  [address] [place]  [place], [place] [address]  Phone [phone]    [place] Lucia Estelle, PT, MSPT, OCS  Specialty Physical Therapy   [address], [place], [place] [address]  Phone [phone]  [email or web address]    [facility]  [address] [place], [place] [address]  Phone: [phone]  Fax: [phone]  This location only accepts female patients at this time        [place]      Call your medication insurance provider or to go your pharmacy to ask them what your Copay would be per month for each of the following:  Vesicare    Toviaz    Enablex    Sanctura XR    Sanctura (twice a day dosing)   Would need to be on an empty stomach    Gelnique (transdermal gel)    Detrol LA    Myrbetriq    Oxytrol (patch)    Oxybutynin ER        You have been referred for physical therapy.  These offices kindly requests that our patients call to schedule their own appointments. The contact information is listed for your below.    Roby Lofts. Lucia Estelle, P.T.       Specialty Physical Therapy                                   [address]   [place] [address]     Phone [phone]     Fax [phone]                    Email: [email or web address]         [email or web address], [facility]  [address]  [place] [address]  Phone: [phone]  Email:  N/A    [phone], PT  [facility]  [address]  [place], [place]  [place]  Phone: [phone]  Fax: [phone]    [phone], [place], [place].  [address]  [place], [place]  Phone: [phone]  Fax: [phone]  Email: [email or web address]    [phone]  [facility]  [address]  [place], [place]  [place]  [phone]  Females Only!      [facility]   [address]  [place], [place]  Phone: [phone]  Fax: [place], [facility]  [place], [place]  [address]  Phone: [phone]  Fax:  [phone]    [phone], P.T.  [facility]   Pelvic Floor Specialist  [address] [place], [place]  Phone: [phone]  Fax: [phone]

## 2016-12-24 NOTE — Progress Notes (Addendum)
Delayed follow-up /acute visit    Assessment/ Plan as of last visit 08/12/15 with Dr. Vernon Prey:   UTIs    No recurrent infections    No current symptoms    Pleased with Keflex after intercourse    She wants to stay with that   OAB/IC    Well compensated at present with Detrol LA4    We reviewed other options   Elevated PVR    Varying    PLAN:   Check urine culture   Continue Detrol and flomax    Antibiotics on hand for travel  FOLLOW-UP:   Six months rather than one year, given her residual today    HPI:  She called the office on 12/04/16 reporting 5/10 burning with urination, frequency, and urgency.  States she was convinced that she had a UTI.   However, culture just showed contamination.  Denied gross hematuria and fevers.    She does have a long-standing history of IC.   Her initial cystoscopy in the operating room in June 2008 revealed diffuse glomerulations all over the bladder surfaces, highly suggestive of interstitial cystitis.   Bladder biopsies and hydrodistention was performed at that time.   Bladder biopsies revealed chronic inflammation without evidence of malignancy.   Symptoms were initially controlled with Elmiron and Detrol.    She recalls having bladder pain in the past while running.  It seemed to improve during a period of time where she became less physically active.  She is becoming more physically active and determined to run 3 miles with her daughter by the end of the summer.     With that, she has noticed an increase in pelvic pain.  Long-standing history of dyspareunia and vaginal dryness.    Continues Detrol LA 4 mg.  Specifically, she continues to find this helpful.  She is pretty sure that Tamsulosin continues to be helpful for bladder emptying.     OAB/IC  She felt Myrbetriq was less effective than Detrol in the past.      UTIs  She is still using keflex just after intercourse only.    She is content with that.    Cystoscopy on 01-08-14 was normal.       Urethral Stenosis  Hx of dilations  prior to 2016.  Reports those have helped but causes increased pain for a week.    SH:  She and her husband bought a Medical illustrator Readings from Last 3 Encounters:   12/24/16 84.8 kg (187 lb)   06/30/16 92.1 kg (203 lb)   03/12/16 92.3 kg (203 lb 8 oz)     Temp Readings from Last 3 Encounters:   03/12/16 36.4 C (97.5 F) (Temporal)   12/16/12 36.2 C (97.1 F)     BP Readings from Last 3 Encounters:   12/24/16 129/84   06/30/16 135/81   03/12/16 120/70     Pulse Readings from Last 3 Encounters:   12/24/16 73   06/30/16 78   03/12/16 70       PHYSICAL EXAM:  Generally healthy appearing. No apparent distress.  Pleasant, with an appropriate mood and affect.  Alert and oriented x3. No gross cranial nerve deficits.  Normal skin color without diaphoresis.  No visible JVD.  Respirations unlabored.  Abdomen soft, nondistended, nontender and without masses nor organomegaly.  No palpable hernias present.  No CVAT or paraspinal muscle tenderness.   No lower extremity edema.  Appropriate strength in both  upper and lower extremities.    External and Internal pelvic examination:  External genitalia -- without discharge, lesions, erythema, and edema.   The labia minora and introitus with slight pallor.  Urethral meatus -- very slight erythema but no obvious caruncle.   No discharge.   No displacement on Valsalva.  No pelvic organ prolapse on Valsalva.  No urinary incontinence with Valsalva.  Urethra and bladder neck -- there was tenderness of the urethra and bladder neck.  No induration, mass or cyst noted.    No vaginal bleeding on exam.  Adnexa -- without masses or tenderness  No cervical motion tenderness.  Pelvic floor musculature -- smooth and with slight discomfort left anterior only.   Pelvic floor muscle control when asked to contract and to relax -- Good contraction and relaxation.      PROCEDURE:  Bladder scan performed due to frequency. PVR 47 mL.    CF 57 ccs    CF 50 ccs    CF 150 ccs    CF Not  assessed today    CF 42 ccs    CF 102 ccs    Procedure:  Straight Catheterization with 14FR female cath d/t recent contaminated urine culture.     Urethra was prepped. Catheter inserted without difficulty. Well tolerated.  PVR approximate 60 mL.    Recent Results (from the past 24 hour(s))   POCT Bladder Scan PVR    Collection Time: 12/24/16 12:14 PM   Result Value Ref Range    Residual mL 61ml    POCT urinalysis dipstick    Collection Time: 12/24/16 12:44 PM   Result Value Ref Range    Specific gravity,UA POCT 1.020 1.002 - 1.030    PH,UA POCT 5.0 5 - 8    Leuk Esterase,UA POCT Negative Negative    Nitrite,UA POCT Negative Negative    Protein,UA POCT Negative Negative mg/dL    Glucose,UA POCT Normal Normal mg/dL    Ketones,UA POCT Negative Negative mg/dL    Urobilinogen,UA  Less than 1 mg/dL    Bilirubin,Ur  Negative    Blood,UA POCT Negative Negative    Exp date 12/21/2017     Lot # [id number]        PVR:   47 mL      Prior Urine Cultures  12/03/16:  >100 K mixed flora  08/12/15:  Positive for E. Coli     Ref. Range 08/13/2014 16:45 09/10/2014 16:00 10/08/2014 16:00   Aerobic Culture Unknown Escherichia coli Escherichia coli Escherichia coli     Prior Urine Cytology   09/20/13:  Negative      Renal Ultrasound:   12-28-13:  IMPRESSION:  No evidence of hydronephrosis or focal lesions. Incidentally noted   Hepatic steatosis.  END OF IMPRESSION.    Cystoscopy with Dr. Vernon Prey 01/08/14              Findings:                        Anterior Urethra:  Normal, without evidence of narrowing or stricture disease.                                              No evidence of urethral diverticula.  Bladder Neck:  No evidence of a bladder neck contracture or other abnormality                        Bladder:  Ureteral orifices in their normal anatomic positions on the trigone.                                              Appearance of ureteral orifices normal.  Clear efflux noted from each side.                         Urothelium:                                              No evidence of TCC                                              No other urothelial lesions found.                                                0-1+ trabeculation noted.                                                                                                            No stones found.                        Bladder Capacity:  Normal        ASSESSMENT:  1. Bladder pain    2. Pelvic pain in female    3. OAB (overactive bladder)    4. Interstitial cystitis    5. Atrophic vaginitis        Reviewed that it is still likely that she has had IC.  She did have tenderness in the urethra and the bladder neck today.  Evidence of very slight urethral meatal inflammation but not definitively a caruncle.  Mild atrophic vaginitis.  Her left anterior pelvic floor was mildly tender to palpation.  Reviewed the potential benefits of dedicated specialty pelvic floor PT, which she does wish to pursue.    She did mention that she has appreciated Dr. Maureen Ralphs help over the years but wanted to see another attending physician in the future.     UTIs    No recurrent infections since 2016    Pleased with Keflex after intercourse    She wants to stay with that   Elevated PVR    Varying    PLAN:  Straight catheterized urine for culture.    Luvena (Replens, if not available) over-the-counter vaginal moisturizer recommended. Application and technique discussed.    Specialty pelvic floor Physical Therapy recommended for pelvic pain.   Contact information provided to the patient to make an appointment in the near future.    Continue Detrol LA 4 mg.   She will contact her insurance to find out about coverage of other options.   She mentioned that she patient $100 per month for her Detrol.    Continue Tamsulosin 0.4 mg for now.    Proper fluid intake reviewed.  Follow IC diet (she stated she has a instruction sheet).    Review that we could consider repeat cystoscopy in the  future.  Could also repeat trial of Elmiron depending on how she does.    Return in about 6 weeks (around 02/04/2017) for Follow-up, Follow up (1st visit with MD), Appt w/ Dr. Barnetta Chapel.    Greater than 25 minutes was spent in the evaluation and management today.  Over 60% of the time was spent counseling and coordinating the care for the above diagnoses, as listed above in the assessment and plan.    The patient was specifically instructed to call if any issues or concerns develop prior to their next appointment.  The appointment was concluded after confirming with the patient that they fully understand the above treatment plan and denied having any further questions or concerns.     Barista may have been used.  To expedite communication, some inadvertent typographical and transcription errors generated by the transcription software may have been missed despite a reasonable effort to make corrections.

## 2016-12-25 ENCOUNTER — Ambulatory Visit: Payer: BLUE CROSS/BLUE SHIELD | Admitting: Urology

## 2016-12-25 ENCOUNTER — Other Ambulatory Visit: Payer: Self-pay | Admitting: Urology

## 2016-12-25 MED ORDER — CEPHALEXIN 500 MG PO CAPS *I*
500.0000 mg | ORAL_CAPSULE | Freq: Every day | ORAL | 1 refills | Status: DC | PRN
Start: 2016-12-25 — End: 2018-07-06

## 2016-12-25 MED ORDER — NITROFURANTOIN MONOHYD MACRO 100 MG PO CAPS *I*
100.0000 mg | ORAL_CAPSULE | Freq: Two times a day (BID) | ORAL | 0 refills | Status: AC
Start: 2016-12-25 — End: 2017-01-01

## 2016-12-27 LAB — AEROBIC CULTURE

## 2017-01-07 ENCOUNTER — Other Ambulatory Visit: Payer: Self-pay | Admitting: Urology

## 2017-01-07 DIAGNOSIS — M6289 Other specified disorders of muscle: Secondary | ICD-10-CM | POA: Insufficient documentation

## 2017-01-07 DIAGNOSIS — R102 Pelvic and perineal pain: Secondary | ICD-10-CM

## 2017-03-05 ENCOUNTER — Telehealth: Payer: Self-pay | Admitting: Urology

## 2017-03-05 NOTE — Telephone Encounter (Signed)
Veronica Bishop is calling to cancel her appointment which is currently scheduled for 03/08/17 due to a lapse in her insurance.    Has the appointment been cancelled? yes    Has the appointment been rescheduled? yes    Date of new appointment? 07/12/17 @ 8:30AM @ [facility]     Does the patient need a call back to reschedule?yes    Patient can be contacted back at [phone]    Patient was upset to hear that she could not schedule a September appointment and that Dr. Barnetta Chapel is booking out to November. Patient would like to know if she can be seen sooner for her pelvic/bladder issues.

## 2017-03-05 NOTE — Telephone Encounter (Signed)
Spoke to [place] and rescheduled appt with Dr. Barnetta Chapel to 05/31/17 at 8:45 am.

## 2017-03-08 ENCOUNTER — Ambulatory Visit: Payer: Self-pay | Admitting: Urology

## 2017-03-15 ENCOUNTER — Other Ambulatory Visit: Payer: Self-pay | Admitting: Urology

## 2017-03-18 ENCOUNTER — Telehealth: Payer: Self-pay | Admitting: Urology

## 2017-03-18 NOTE — Telephone Encounter (Signed)
Mailed an appointment card to patient to notify of a change in providers schedule & appointment change.    New appointment is : 12/7 @ 9:15 at [place] office      Call Center Please Confirm With Patient When They Call Back

## 2017-04-01 ENCOUNTER — Other Ambulatory Visit: Payer: Self-pay | Admitting: Urology

## 2017-04-01 NOTE — Telephone Encounter (Signed)
Patient seen 12/24/2016 with the plan below. Follow up scheduled 07/30/2017.    PLAN:  Straight catheterized urine for culture.    Luvena (Replens, if not available) over-the-counter vaginal moisturizer recommended. Application and technique discussed.    Specialty pelvic floor Physical Therapy recommended for pelvic pain.                        Contact information provided to the patient to make an appointment in the near future.    Continue Detrol LA 4 mg.                        She will contact her insurance to find out about coverage of other options.                        She mentioned that she patient $100 per month for her Detrol.    Continue Tamsulosin 0.4 mg for now.    Proper fluid intake reviewed.  Follow IC diet (she stated she has a instruction sheet).    Review that we could consider repeat cystoscopy in the future.  Could also repeat trial of Elmiron depending on how she does.    Return in about 6 weeks (around 02/04/2017) for Follow-up, Follow up (1st visit with MD), Appt w/ Dr. Barnetta Chapel

## 2017-05-31 ENCOUNTER — Ambulatory Visit: Payer: Self-pay | Admitting: Urology

## 2017-06-11 ENCOUNTER — Telehealth: Payer: Self-pay | Admitting: Urology

## 2017-06-11 ENCOUNTER — Telehealth: Payer: Self-pay

## 2017-06-11 NOTE — Telephone Encounter (Signed)
Patients appointment 12/7  has been cancelled due to changes in the physicians schedule.  Please call patient with our apologies.    Patients appt 11/9 was bumped to 12/7, she requested sooner,  Please offer   07/09/17 (Fri) 8:00 AM 15 min Sessions, Anne Ng, MD [place] [facility]       Access center:  If patient is unable to make this new appointment due to conflicts in schedule please arrange for the next available appointment date/time.  A message can be sent to pool if patient is uncomfortable with appointments available.

## 2017-06-11 NOTE — Telephone Encounter (Signed)
Sent my chart message on new appt 11/16 @ 8:00am w/ Dr Sessions

## 2017-06-23 ENCOUNTER — Telehealth: Payer: Self-pay | Admitting: Urology

## 2017-06-23 NOTE — Telephone Encounter (Signed)
LMOM, advised patient of rescheduled appt 11/16 @ 8:00am w/ Dr Sessions in [facility] office

## 2017-06-29 ENCOUNTER — Ambulatory Visit: Payer: No Typology Code available for payment source | Attending: Retina Ophthalmology | Admitting: Retina Ophthalmology

## 2017-06-29 ENCOUNTER — Encounter: Payer: Self-pay | Admitting: Retina Ophthalmology

## 2017-06-29 VITALS — BP 115/74 | HR 69

## 2017-06-29 DIAGNOSIS — H33311 Horseshoe tear of retina without detachment, right eye: Secondary | ICD-10-CM | POA: Insufficient documentation

## 2017-06-29 DIAGNOSIS — H59033 Cystoid macular edema following cataract surgery, bilateral: Secondary | ICD-10-CM | POA: Insufficient documentation

## 2017-06-29 NOTE — Progress Notes (Signed)
Subjective:   Subjective 06/29/2017   Chief Complaint   Patient presents with    Blurred Vision    Spots and/or Floaters     HPI      1 year f/u  PVD with VH and retinal tears  --s/p barrier LPC OD (05/20/14)  --s/p barrier LPC OD (05/21/14)  --s/p barrier LPC OD (05/24/14)     Chronic pseudophakic ME OS  HX OF RD repair X 3 (3/11), now resolved  Following yearly now    Hx RD repair OS  RD repair X 3 (3/11)  See above    PCIOL OS  -PCO OS, follow for now    Pt states vision seems stable, but OD notices a few new floaters over the   past few weeks       Last edited by Johnell Comings, MD on 06/29/2017  9:25 AM.       Current Outpatient Prescriptions:     tolterodine (DETROL LA) 4 MG 24 hr capsule, TAKE ONE CAPSULE BY MOUTH DAILY SWALLOW WHOLE DO NOT CRUSH OR CHEW, Disp: 90 capsule, Rfl: 3    cephalexin (KEFLEX) 500 MG capsule, Take 1 capsule (500 mg total) by mouth daily as needed  WITH INTERCOURSE, Disp: 15 capsule, Rfl: 1    tamsulosin (FLOMAX) 0.4 MG capsule, Take 1 capsule (0.4 mg total) by mouth every evening, Disp: 90 capsule, Rfl: 3    PATADAY 0.2 % ophthalmic solution, INSTIL 1 DROP INTO BOTH EYES DAILY AS NEEDED, Disp: , Rfl: 6    naproxen sodium (ANAPROX) 220 MG tablet, Take 220 mg by mouth as needed   , Disp: , Rfl:     aspirin 81 MG tablet, Take 81 mg by mouth daily, Disp: , Rfl:     EPINEPHrine (EPIPEN) 0.3 MG/0.3ML SOAJ injection, INJECT 0.3ML INTRAMUSCULARLY AS DIRECTED, Disp: 1 Device, Rfl: 5    mometasone (NASONEX) 50 MCG/ACT nasal spray, 2 sprays by Each Nare route daily, Disp: 17 g, Rfl: 5  Sulfa antibiotics and No known latex allergy   No birth history on file.  Past Medical History:   Diagnosis Date    Cataract     Chronic kidney disease     IC (interstitial cystitis)     Macular edema     PERSISITENT PSEUDOPHAKIC OS    PVD (posterior vitreous detachment), right 05/21/2014    Refractive error 03/27/2011    Varicella     Vitreous hemorrhage 05/21/2014      Past Surgical History:      Procedure Laterality Date    25G PPV/IVT/AFX/EL/22%SF6 OS  05/16/09    Avastin #1 OS 08/14/11 consent 08/14/11 maxitrol      barrier laser, right eye  05/20/14    Dr Denyce Robert    BARRIER LPC OS  07/30/09    BUNIONECTOMY      Hallux Valgus (Bunion) Correction Conversion Data     CATARACT REMOVAL  10/24/09    PC IOL OS    CESAREAN SECTION, CLASSIC      Cesarean Section Conversion Data     HX TONSILLECTOMY/ADENOIDECTOMY      Tonsillectomy Conversion Data     INTRA VITREAL TRIESENCE OS  05/30/10    4MG     PHACO/IOL/25G PPV/PCOX/MP/AFX/ICG/ILMX/14% C3 F8 OS  10/24/09    RETINAL DETACHMENT SURGERY      X 3    SB/20G PPV/MO/PFC/RTX/AFX/15% C3 F8 OS  06/20/09      History   Smoking Status    Former Smoker  Types: Cigarettes    Quit date: 02/15/1981   Smokeless Tobacco    Never Used      History   Alcohol Use    Yes      History   Drug Use No      Specialty Problems        Ophthalmology Problems    Cataract        Cystoid macular edema of left eye        Refractive error        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, right        PVD (posterior vitreous detachment), right        Vitreous hemorrhage                  Objective:   Objective   Vitals:    06/29/17 0904   BP: 115/74   Pulse: 69       Base Eye Exam     Visual Acuity (Snellen - Linear)      Right Left   Dist sc  CF at 1' ecc   Dist cc 20/25        Correction:  Glasses      Tonometry (Tonopen, 8:58 AM)      Right Left   Pressure 17 16         Pupils      React APD   Right Brisk None   Left Slow +1         Visual Fields      Left Right   Result  Full   Restrictions Central scotoma          Extraocular Movement      Right Left   Result Full, Ortho Full, Ortho         Neuro/Psych     Oriented x3:  Yes    Mood/Affect:  Normal      Dilation     Both eyes:  2.5% Phenylephrine, 1.0% Tropicamide @ 9:01 AM            Slit Lamp and Fundus Exam     External Exam      Right Left    External Normal Normal      Slit Lamp Exam      Right Left    Lids/Lashes  Normal Normal    Conjunctiva/Sclera White and quiet White and quiet    Cornea Clear Clear    Anterior Chamber Deep and quiet Deep and quiet    Iris Round and reactive Round and reactive    Lens 1+ Nuclear sclerosis Posterior chamber intraocular lens; 3+ PCO    Vitreous Posterior vitreous detachment; mild VH clear      Fundus Exam      Right Left    Disc Normal Normal    C/D Ratio 0.3 0.5    Macula Normal SR band, resolved ME    Vessels Normal attenuated    Periphery Multiple tears scattered 360 all lasered with no new heme inf 180 Retx; attached, Laser scar, Macula attached, laser            Refraction     Wearing Rx      Sphere Cylinder Axis Add   Right -0.75 +0.75 142 +2.50   Left +2.50  180 +2.00       Age:  71yrs    Type:  PAL  Assessment/Plan:   Assessment 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

## 2017-06-29 NOTE — Assessment & Plan Note (Signed)
PVD with VH and retinal tears  --s/p barrier LPC OD (05/20/14)  --s/p barrier LPC OD (05/21/14)  --s/p barrier LPC OD (05/24/14)   --doing great, no new heme or tears  --f/u 1 yr OCT OU, dialted OU  --SSX of RD explained and understood    Chronic pseudophakic ME OS  HX OF RD repair X 3 (3/11), now resolved  Following yearly now    Hx RD repair OS  RD repair X 3 (3/11)  See above    PCIOL OS  -PCO OS, follow for now

## 2017-07-09 ENCOUNTER — Ambulatory Visit: Payer: No Typology Code available for payment source | Attending: Urology | Admitting: Urology

## 2017-07-09 ENCOUNTER — Encounter: Payer: Self-pay | Admitting: Urology

## 2017-07-09 VITALS — BP 144/91 | HR 83 | Ht 62.0 in | Wt 191.0 lb

## 2017-07-09 DIAGNOSIS — N3281 Overactive bladder: Secondary | ICD-10-CM | POA: Insufficient documentation

## 2017-07-09 DIAGNOSIS — N390 Urinary tract infection, site not specified: Secondary | ICD-10-CM | POA: Insufficient documentation

## 2017-07-09 DIAGNOSIS — N3010 Interstitial cystitis (chronic) without hematuria: Secondary | ICD-10-CM | POA: Insufficient documentation

## 2017-07-09 DIAGNOSIS — R102 Pelvic and perineal pain: Secondary | ICD-10-CM | POA: Insufficient documentation

## 2017-07-09 LAB — URINALYSIS WITH MICROSCOPIC
Blood,UA: NEGATIVE
Ketones, UA: NEGATIVE
Nitrite,UA: POSITIVE — AB
Protein,UA: NEGATIVE mg/dL
RBC,UA: 1 /hpf (ref 0–2)
Specific Gravity,UA: 1.019 (ref 1.002–1.030)
WBC,UA: 12 /hpf — ABNORMAL HIGH (ref 0–5)
pH,UA: 5 (ref 5.0–8.0)

## 2017-07-09 LAB — POCT URINALYSIS DIPSTICK
Blood,UA POCT: NEGATIVE
Glucose,UA POCT: NORMAL mg/dL
Ketones,UA POCT: NEGATIVE mg/dL
Leuk Esterase,UA POCT: 1 — AB
Lot #: 33828902
Nitrite,UA POCT: POSITIVE — AB
PH,UA POCT: 5 (ref 5–8)
Protein,UA POCT: NEGATIVE mg/dL
Specific gravity,UA POCT: 1.01 (ref 1.002–1.030)

## 2017-07-09 LAB — POCT BLADDER SCAN PVR

## 2017-07-09 NOTE — Patient Instructions (Signed)
You have been referred for physical therapy.  These offices kindly requests that our patients call to schedule their own appointments. The contact information is listed for your below.    Wendy M. Featherstone, P.T.      Jennifer Morin, P.T.       Specialty Physical Therapy                                   [address]   [place] [address]     Phone [phone]     Fax [phone]                    Email: [email or web address]         Susan Parker, PT  [facility]  [address]  [place] [address]  Phone: [phone]  Email: N/A    Becca Lind, PT  [facility]  [address]  [place], [place]  [address]  Phone: [phone]  Fax: [phone]    Zoe Fackelman, PT  [facility]  [address]  [place], [place] [address]  Phone: [phone]  Fax: [phone]  Email: [email or web address]    Kelly Lockwood  [facility]  [address]  [place], [place]  [address]  [phone]  Females Only!      [facility]  Outpatient Rehabilitation and Sports Therapy   [address]  [place], [place] [address]  Phone: [phone]  Fax: [phone]    Christine Trumble, PT  [facility]  [address]  [place], [place]  [address]  Phone: [phone]  Fax:  [phone]    Lisa Trunzo, P.T.  [facility]   Pelvic Floor Specialist  [address] [place] [address]  [place], [place] [address]  Phone: [phone]  Fax: [phone]

## 2017-07-09 NOTE — Progress Notes (Signed)
Veronica Bishop is being seen in follow-up for the following:  1.  Interstitial cystitis  Underwent cystoscopy with hydrodistention June 2008 with diffuse glomerulations  Bladder biopsies negative for malignancy  Symptoms initially controlled with Elmiron and Detrol  2.  Overactive bladder  Detrol LA for more effective than prior trial of Myrbetriq  3.  Urinary tract infections  Postcoital Keflex      HPI:  Plan as established at appointment with Natasha Mead, PA, on 12/24/16:  Straight catheterized urine for culture.    Luvena (Replens, if not available) over-the-counter vaginal moisturizer recommended. Application and technique discussed.    Specialty pelvic floor Physical Therapy recommended for pelvic pain.   Contact information provided to the patient to make an appointment in the near future.    Continue Detrol LA 4 mg.   She will contact her insurance to find out about coverage of other options.   She mentioned that she patient $100 per month for her Detrol.    Continue Tamsulosin 0.4 mg for now.    Proper fluid intake reviewed.  Follow IC diet (she stated she has a instruction sheet).    Review that we could consider repeat cystoscopy in the future.  Could also repeat trial of Elmiron depending on how she does.      Stopped tamsulosin in the Spring; the "little bit of leakage" ceased. It had been prescribed by Dr. Vernon Prey "to help my flow."  Long history of urethral dilation; PVR is followed.  She states the dilation "seems to help with my being able to fully empty my bladder."  Otherwise, may need to double void.  She adds: "Natasha Mead though I might have pelvic floor issues, and I think he's right.  Some of his suggestions have made a world of difference."    Saw PT at [facility] twice.  She then had an appointment with another physical therapist through the same office but was discouraged by the fact that the practitioner was unable to provide answers to her questions.  Moreover, the treatments were  expensive, as her insurance was not covering it.  She does continue to do the exercises recommended by Danae Chen.    There have also been times that "my rectum just aches."  Also consistent with pelvic floor dysfunction.     In terms of IC, the most recent cysto was visibly normal: "healing"    Today, "just a little discomfort when I urinated.  I'm wondering if I might have an infection."  Has not been using post-coital Keflex.     Urgency not an issue over the Summer; maybe a little more now.  Still on Detrol; notices if she misses a day or 2.     Daily fluid intake:  -- water, 64 oz (more if working out)  -- regular tea, 0-1 cup; if feeling "uncomfortable", then decaff.  -- alcohol, 3-4 night per week, usually on weekend    BM: soft, normal  Nocturia: usually x 1; occasionally x 2   Diurnal void every 1-1.5 hours    Allergies/Medications/PMH/PSH/FH all updated in EMR    ROS: No other interval changes in the review of systems    Current Outpatient Prescriptions   Medication    tolterodine (DETROL LA) 4 MG 24 hr capsule    PATADAY 0.2 % ophthalmic solution    aspirin 81 MG tablet    cephalexin (KEFLEX) 500 MG capsule    tamsulosin (FLOMAX) 0.4 MG capsule    mometasone (NASONEX)  50 MCG/ACT nasal spray    naproxen sodium (ANAPROX) 220 MG tablet    EPINEPHrine (EPIPEN) 0.3 MG/0.3ML SOAJ injection     No current facility-administered medications for this visit.        BP (!) 144/91 Comment: not normally high   Pulse 83    Ht 1.575 m (5\' 2" )    Wt 86.6 kg (191 lb)    BMI 34.93 kg/m       PHYSICAL EXAM:  GENERAL: Well-developed, well-nourished, in no acute distress.  Smiling and energetic  HEENT: Normocephalic, atraumatic.  Oropharynx clear.  Trachea midline  NEURO: Oriented to person, place, time, and situation  PSYCHIATRIC: Normal mood and affect  SKIN: Normal color.  Nondiaphoretic  LYMPHATIC: Normal cervical, supraclavicular examination   RESPIRATORY: Normal respiratory effort.  BACK/THORAX: No costovertebral  angle tenderness.  No axial skeletal tenderness.   ABDOMEN: Abdomen soft, nontender, nondistended, and without masses  PULSES: Dorsalis pedis and posterior tibialis pulses palpable bilaterally and symmetric   EXTREMITIES: No cyanosis, clubbing, or edema.  Calves nontender.      [Exam by Latina Craver, PA, on 12/24/16  External and Internal pelvic examination:  External genitalia -- without discharge, lesions, erythema, and edema.   The labia minora and introitus with slight pallor.  Urethral meatus -- very slight erythema but no obvious caruncle.   No discharge.   No displacement on Valsalva.  No pelvic organ prolapse on Valsalva.  No urinary incontinence with Valsalva.  Urethra and bladder neck -- there was tenderness of the urethra and bladder neck.  No induration, mass or cyst noted.    No vaginal bleeding on exam.  Adnexa -- without masses or tenderness  No cervical motion tenderness.  Pelvic floor musculature -- smooth and with slight discomfort left anterior only.   Pelvic floor muscle control when asked to contract and to relax -- Good contraction and relaxation.]      PROCEDURE:    Bladder emptying evaluated this patient taking anticholinergic therapy and history of incomplete bladder emptying:  Postvoid residual as assessed by bladder scan on 07/09/17 recorded 90 mL  Postvoid residual as assessed by bladder scan on 12/24/16 recorded 47 mL      Prior Urine Cultures  Aerobic Culture   Date/Time Value Ref Range Status   12/24/2016 01:28 PM Escherichia coli (!)  Final     Comment:     >100,000/ml       Prior Urine Cytology   09/20/13:  Negative      Renal Ultrasound:   12-28-13:  IMPRESSION:  No evidence of hydronephrosis or focal lesions. Incidentally noted   Hepatic steatosis.  END OF IMPRESSION.    Cystoscopy with Dr. Vernon Prey 01/08/14              Findings:                        Anterior Urethra:  Normal, without evidence of narrowing or stricture disease.                                              No evidence of  urethral diverticula.                        Bladder Neck:  No evidence of a bladder neck contracture or other abnormality  Bladder:  Ureteral orifices in their normal anatomic positions on the trigone.                                              Appearance of ureteral orifices normal.  Clear efflux noted from each side.                        Urothelium:                                              No evidence of TCC                                              No other urothelial lesions found.                                                0-1+ trabeculation noted.                                                                                                            No stones found.                        Bladder Capacity:  Normal    DISCUSSED:  We reviewed her urologic history.  Overall, she feels she has done quite well with the modified plan as recommended by Natasha Mead, PA.  She really feels that the pelvic floor issues have been a problem.  I reviewed the pelvic floor anatomy, focusing on the relationship of the urinary system, gynecologic system, and the pelvic floor musculature.  I then discussed some of the mechanisms by which pelvic floor musculature imbalance or dysfunction can contribute to her symptoms.  In fact, I suspect that the pelvic floor dysfunction may have been more of an issue than any true urethral stricture.  For now, I would hold off on additional urethral dilation, and she is very pleased with this plan.  She found the dilations particularly painful.  In fact, when I explained the pelvic floor anatomy and the associated potential symptoms, she exclaimed, "No kiddng!  It all makes somuch sense."  In fact, she is willing to pursue additional pelvic floor physical therapy.  While certainly open to returning to [facility] care, I mentioned Specialty Physical Therapy and the practitioners there; she is eager to pursue evaluation and treatment through that  office.    She will remain off of tamsulosin.  Postvoid residual certainly remains normal at this time.  She  will also continue Detrol.    Her questions were answered to her stated satisfaction, and she expressed appreciation for the evaluation and discussion.      ASSESSMENT:  1.  History of interstitial cystitis  Underwent cystoscopy with hydrodistention June 2008 with diffuse glomerulations  Bladder biopsies negative for malignancy  Symptoms initially controlled with Elmiron and Detrol  2.  Overactive bladder  Detrol LA for more effective than prior trial of Myrbetriq  3.  Urinary tract infections  Prior use of post coital Keflex; she has not been using it lately  4.  History of urethral dilation for possible urethral stricture and elevated postvoid residual  Prior use of tamsulosin; discontinued Spring 2018 with no significant impact on postvoid residual  5.  Pelvic floor muscle dysfunction    PLAN:  1.  Urine analysis and culture whenever suspicious of urinary tract infection  Today's urine specimen sent for those studies  Patient still has available Keflex for post coital use; she has not been using it lately  2.  Remain off tamsulosin  3.  Continue Detrol LA 4  4.  Referral to Specialty Physical Therapy for evaluation and treatment  In the interim, continue with previously recommended pelvic floor exercises  5.  Improve oral hydration.  Based on body weight, her minimal recommended daily water intake approaches 80 ounces  6.  Follow-up 4-6 months for reassessment of symptoms and postvoid residual, sooner if needed.    The appointment was concluded after confirming with the patient that he/she fully understands the above treatment plan.  He/She denied having any further questions or concerns but knows to call if any issue develop prior to their next appointment.    Dictated using Systems analyst.

## 2017-07-11 LAB — AEROBIC CULTURE

## 2017-07-12 ENCOUNTER — Ambulatory Visit: Payer: Self-pay | Admitting: Urology

## 2017-07-12 ENCOUNTER — Telehealth: Payer: Self-pay | Admitting: Urology

## 2017-07-12 ENCOUNTER — Other Ambulatory Visit: Payer: Self-pay | Admitting: Urology

## 2017-07-12 MED ORDER — CEFDINIR 300 MG PO CAPS *I*
300.0000 mg | ORAL_CAPSULE | Freq: Two times a day (BID) | ORAL | 0 refills | Status: AC
Start: 2017-07-12 — End: 2017-07-17

## 2017-07-12 NOTE — Telephone Encounter (Signed)
Called and spoke with patient, informed of below. States understanding. Thanks [employer].      Notes recorded by Sessions, Anne Ng, MD on 07/12/2017 at 8:03 AM EST  Please notify patient of positive urine culture. A prescription for cefdinir has been faxed to the pharmacy on record.   Component 3d ago   Aerobic Culture Escherichia coli     Comment: >100,000/ml

## 2017-07-30 ENCOUNTER — Ambulatory Visit: Payer: Self-pay | Admitting: Urology

## 2017-08-06 ENCOUNTER — Ambulatory Visit: Payer: No Typology Code available for payment source | Attending: Primary Care | Admitting: Primary Care

## 2017-08-06 ENCOUNTER — Encounter: Payer: Self-pay | Admitting: Primary Care

## 2017-08-06 VITALS — BP 126/76 | HR 84 | Temp 97.5°F | Ht 62.0 in | Wt 199.0 lb

## 2017-08-06 DIAGNOSIS — J00 Acute nasopharyngitis [common cold]: Secondary | ICD-10-CM

## 2017-08-06 DIAGNOSIS — IMO0001 Reserved for inherently not codable concepts without codable children: Secondary | ICD-10-CM

## 2017-08-06 NOTE — Progress Notes (Signed)
[facility] [place]   Progress Note    Reason For Visit:   Chief Complaint   Patient presents with    Headache     sinus?         Subjective:      Veronica Bishop is 61 y.o. year old female with IC, NASH presenting today with concern for sinus infection.    She reports a week and a half of evolving symptoms starting last Tuesday after flying to [place].  She noted stuffiness and right ear pain, as well as cervical lymphadenopathy/tenderness.  She subsequently developed a headache which is frontal/maxillary bilaterally.  Right ear pain has been on and off, but has generally been slowly improving since her flight back.  She denies any fevers or chills, but has had occasional sweats at night which is unusual.  Diphenhydramine may have helped a little bit on occasion, Excedrin not very much.    No nausea or vomiting.  In general, symptoms have been stable, without generalized worsening or improvement.  She has not had any nasal drainage.  No tooth pain.  There is no cough and breathing has been fine.  She is primarily bothered by the mild but constant headache/pressure.      Medications:     Current Outpatient Prescriptions on File Prior to Visit   Medication Sig Dispense Refill    tolterodine (DETROL LA) 4 MG 24 hr capsule TAKE ONE CAPSULE BY MOUTH DAILY SWALLOW WHOLE DO NOT CRUSH OR CHEW 90 capsule 3    cephalexin (KEFLEX) 500 MG capsule Take 1 capsule (500 mg total) by mouth daily as needed  WITH INTERCOURSE 15 capsule 1    PATADAY 0.2 % ophthalmic solution INSTIL 1 DROP INTO BOTH EYES DAILY AS NEEDED  6    naproxen sodium (ANAPROX) 220 MG tablet Take 220 mg by mouth as needed         aspirin 81 MG tablet Take 81 mg by mouth daily      EPINEPHrine (EPIPEN) 0.3 MG/0.3ML SOAJ injection INJECT 0.3ML INTRAMUSCULARLY AS DIRECTED 1 Device 5    mometasone (NASONEX) 50 MCG/ACT nasal spray 2 sprays by Each Nare route daily 17 g 5     No current facility-administered medications on file prior to  visit.        Medications were reviewed and reconciled today.   Allergies:     Allergies   Allergen Reactions    Sulfa Antibiotics      Created by Conversion - 0;     No Known Latex Allergy        Review of Systems:     Pertinent positives and negatives as per HPI.     Physical Exam:     Vitals:    08/06/17 0828   BP: 126/76   Pulse: 84   Temp: 36.4 C (97.5 F)   TempSrc: Temporal   SpO2: 95%   Weight: 90.3 kg (199 lb)   Height: 1.575 m (5\' 2" )     Wt Readings from Last 3 Encounters:   08/06/17 90.3 kg (199 lb)   07/09/17 86.6 kg (191 lb)   12/24/16 84.8 kg (187 lb)     BP Readings from Last 3 Encounters:   08/06/17 126/76   07/09/17 (!) 144/91   06/29/17 115/74       General: In no apparent distress.  Alert, oriented, pleasant.  Nontoxic.  Eyes: Anicteric with normal conjunctiva.  Pupils equally round.  ENMT: Normal  pinnae.  No frontal sinus tenderness, mild bilateral maxillary tenderness.  Bilateral EACs patent, TMs pearly, normal light reflexes. Acuity to conversational tones is good.  Moist mucous membranes.  Lips without lesions.   Neck: Trachea midline.   Lymph: No cervical or supraclavicular lymphadenopathy.  Pulmonary: Clear to auscultation bilaterally with good air movement. No wheezes, rhonchi, or rales.  Respirations unlabored.  Cardiovascular:  Regular rate and rhythm. No murmurs, rubs, or gallops. No pedal edema.    Assessment and Plan:     Veronica Bishop was seen today for headache.    Diagnoses and all orders for this visit:    Cold:  61 year old female with history of Veronica Bishop and frequent UTIs presenting today with concern for ongoing sinus symptoms.  We discussed etiology of sinus infection including viral and bacterial, as well as possible allergic component/pressure change with relation to recent flying, and at that recommendations for antibiosis in sinusitis is that this be delayed 3-4 weeks if able as the illnesses typically self limited.  She is in agreement with this, and would like to trial  conservative measures.  She will trial nasal saline sinus flush, which she does have at home but has not yet used as well as intranasal steroid spray.  She was encouraged to contact the office if no improvement over the next 1-2 weeks.      Follow up as needed.     Veronica Curia, MD 08/06/2017 8:57 AM

## 2017-08-26 ENCOUNTER — Other Ambulatory Visit
Admission: RE | Admit: 2017-08-26 | Discharge: 2017-08-26 | Disposition: A | Discharge status: Home / Self Care | Payer: No Typology Code available for payment source | Source: Ambulatory Visit | Attending: Urology | Admitting: Urology

## 2017-08-26 DIAGNOSIS — N390 Urinary tract infection, site not specified: Secondary | ICD-10-CM | POA: Insufficient documentation

## 2017-08-26 LAB — URINALYSIS WITH MICROSCOPIC
Blood,UA: NEGATIVE
Ketones, UA: NEGATIVE
Leuk Esterase,UA: NEGATIVE
Nitrite,UA: NEGATIVE
Protein,UA: NEGATIVE mg/dL
RBC,UA: 1 /hpf (ref 0–2)
Specific Gravity,UA: 1.02 (ref 1.002–1.030)
WBC,UA: 9 /hpf — AB (ref 0–5)
pH,UA: 6 (ref 5.0–8.0)

## 2017-08-27 ENCOUNTER — Telehealth: Payer: Self-pay | Admitting: Urology

## 2017-08-27 ENCOUNTER — Other Ambulatory Visit: Payer: Self-pay | Admitting: Urology

## 2017-08-27 DIAGNOSIS — N390 Urinary tract infection, site not specified: Secondary | ICD-10-CM

## 2017-08-27 MED ORDER — CIPROFLOXACIN HCL 500 MG PO TABS *I*
500.0000 mg | ORAL_TABLET | Freq: Two times a day (BID) | ORAL | 0 refills | Status: DC
Start: 2017-08-27 — End: 2018-06-22

## 2017-08-27 NOTE — Telephone Encounter (Signed)
Veronica Bishop is requesting a return call to review her recent test results.    What type of test was done? Urine sample     Where was the test done? [facility]     On what day was the test done? 08/26/2017    Please return the patients call at [phone] (H)      FYI patient is leaving on the plane and she needs to know ASAP if she has an infection

## 2017-08-28 ENCOUNTER — Other Ambulatory Visit: Payer: Self-pay | Admitting: Urology

## 2017-08-28 DIAGNOSIS — N390 Urinary tract infection, site not specified: Secondary | ICD-10-CM

## 2017-08-28 LAB — AEROBIC CULTURE

## 2017-08-28 MED ORDER — NITROFURANTOIN MONOHYD MACRO 100 MG PO CAPS *I*
100.0000 mg | ORAL_CAPSULE | Freq: Two times a day (BID) | ORAL | 2 refills | Status: DC
Start: 2017-08-28 — End: 2018-06-22

## 2017-08-30 NOTE — Telephone Encounter (Signed)
Patient was treated over the weekend by Dr. Vernon Prey. Helyn App, RN

## 2017-09-14 ENCOUNTER — Other Ambulatory Visit: Payer: Self-pay | Admitting: Urology

## 2017-09-15 MED ORDER — TOLTERODINE TARTRATE 4 MG PO CP24 *I*
4.0000 mg | ORAL_CAPSULE | Freq: Every day | ORAL | 3 refills | Status: DC
Start: 2017-09-15 — End: 2018-11-22

## 2017-10-20 ENCOUNTER — Telehealth: Payer: Self-pay | Admitting: Urology

## 2017-10-20 NOTE — Telephone Encounter (Signed)
Left message for patient explaining Dr Sessions will not be in the office on 4/19 and her new appointment will be 4/5 @ 9:15 AM . I requested patient return call to confirm new appointment.  [facility]- Please advise patient of new appointment if unavailable to keep appointment please reschedule and document outcome of phone call

## 2017-11-24 ENCOUNTER — Other Ambulatory Visit: Payer: Self-pay | Admitting: Gastroenterology

## 2017-11-26 ENCOUNTER — Ambulatory Visit: Payer: No Typology Code available for payment source | Admitting: Urology

## 2017-12-10 ENCOUNTER — Ambulatory Visit: Payer: No Typology Code available for payment source | Admitting: Urology

## 2018-05-13 ENCOUNTER — Telehealth: Payer: Self-pay | Admitting: Urology

## 2018-05-13 NOTE — Telephone Encounter (Signed)
Left VM for patient reminding them of their appointment on Monday

## 2018-05-13 NOTE — Telephone Encounter (Signed)
Ms. Bankert is calling to cancel her appointment which is currently scheduled for 05/16/18 (due to out of town)    Has the appointment been cancelled? yes    Has the appointment been rescheduled? yes    Date of new appointment?09/23/18     Does the patient need a call back to reschedule?no - however the (4-6 month) follow up visit was originally scheduled 11/2017 then extended out until 04/2018 and extended now again to 08/2018, patient wants to make sure provider is alright with that.  Patient stating she has no issues currently.  Patient is on wait list    Patient can be contacted back at [phone]

## 2018-05-16 ENCOUNTER — Ambulatory Visit: Payer: No Typology Code available for payment source | Admitting: Urology

## 2018-06-22 ENCOUNTER — Telehealth: Payer: Self-pay | Admitting: Primary Care

## 2018-06-22 DIAGNOSIS — Z139 Encounter for screening, unspecified: Secondary | ICD-10-CM

## 2018-06-22 DIAGNOSIS — K7581 Nonalcoholic steatohepatitis (NASH): Secondary | ICD-10-CM

## 2018-06-22 NOTE — Telephone Encounter (Signed)
Patient scheduled physical for November. Please order labs before appointment.     Thank you

## 2018-06-22 NOTE — Telephone Encounter (Signed)
Pt verified DOB . Pt aware of lab orders .

## 2018-06-22 NOTE — Telephone Encounter (Signed)
ordered

## 2018-07-01 ENCOUNTER — Ambulatory Visit: Payer: No Typology Code available for payment source | Attending: Retina Ophthalmology | Admitting: Retina Ophthalmology

## 2018-07-01 ENCOUNTER — Other Ambulatory Visit
Admission: RE | Admit: 2018-07-01 | Discharge: 2018-07-01 | Disposition: A | Discharge status: Home / Self Care | Payer: No Typology Code available for payment source | Source: Ambulatory Visit | Attending: Primary Care | Admitting: Primary Care

## 2018-07-01 ENCOUNTER — Encounter: Payer: Self-pay | Admitting: Retina Ophthalmology

## 2018-07-01 DIAGNOSIS — Z139 Encounter for screening, unspecified: Secondary | ICD-10-CM

## 2018-07-01 DIAGNOSIS — H33311 Horseshoe tear of retina without detachment, right eye: Secondary | ICD-10-CM | POA: Insufficient documentation

## 2018-07-01 DIAGNOSIS — K7581 Nonalcoholic steatohepatitis (NASH): Secondary | ICD-10-CM

## 2018-07-01 DIAGNOSIS — N390 Urinary tract infection, site not specified: Secondary | ICD-10-CM | POA: Insufficient documentation

## 2018-07-01 LAB — BASIC METABOLIC PANEL
Anion Gap: 7 (ref 7–16)
CO2: 29 mmol/L — ABNORMAL HIGH (ref 20–28)
Calcium: 9.7 mg/dL (ref 8.6–10.2)
Chloride: 103 mmol/L (ref 96–108)
Creatinine: 0.75 mg/dL (ref 0.51–0.95)
GFR,Black: 99 *
GFR,Caucasian: 86 *
Glucose: 130 mg/dL — ABNORMAL HIGH (ref 60–99)
Lab: 11 mg/dL (ref 6–20)
Potassium: 3.9 mmol/L (ref 3.3–5.1)
Sodium: 139 mmol/L (ref 133–145)

## 2018-07-01 LAB — LIPID PANEL
Chol/HDL Ratio: 3.4
Cholesterol: 219 mg/dL — AB
HDL: 64 mg/dL — ABNORMAL HIGH (ref 40–60)
LDL Calculated: 110 mg/dL
Non HDL Cholesterol: 155 mg/dL
Triglycerides: 226 mg/dL — AB

## 2018-07-01 LAB — URINALYSIS WITH MICROSCOPIC
Blood,UA: NEGATIVE
Ketones, UA: NEGATIVE
Nitrite,UA: POSITIVE — AB
Protein,UA: NEGATIVE mg/dL
RBC,UA: 1 /hpf (ref 0–2)
Specific Gravity,UA: 1.015 (ref 1.002–1.030)
WBC,UA: 3 /hpf (ref 0–5)
pH,UA: 6 (ref 5.0–8.0)

## 2018-07-01 LAB — HEMOGLOBIN A1C: Hemoglobin A1C: 5.6 %

## 2018-07-01 LAB — VITAMIN D: 25-OH Vit Total: 29 ng/mL — ABNORMAL LOW (ref 30–60)

## 2018-07-01 LAB — HEPATIC FUNCTION PANEL
ALT: 28 U/L (ref 0–35)
AST: 25 U/L (ref 0–35)
Albumin: 4.2 g/dL (ref 3.5–5.2)
Alk Phos: 81 U/L (ref 35–105)
Bilirubin,Direct: <0.2 mg/dL (ref 0.0–0.3)
Bilirubin,Total: 0.4 mg/dL (ref 0.0–1.2)
Total Protein: 6.5 g/dL (ref 6.3–7.7)

## 2018-07-01 NOTE — Progress Notes (Signed)
Subjective:   Subjective 07/01/2018   Chief Complaint   Patient presents with    Blurred Vision    Follow-up     HPI      1 year follow up  PVD with VH and retinal tears   --s/p barrier LPC OD (05/20/14)   --s/p barrier LPC OD (05/21/14)   --s/p barrier LPC OD (05/24/14)     Chronic pseudophakic ME OS   HX OF RD repair X 3 (3/11), now resolved   Following yearly now      Hx RD repair OS   RD repair X 3 (3/11)   See above      PCIOL OS   -PCO OS, follow for now     Patient states VA stable denies pain, flashes OU.  Still sees floaters OD.      Ocular Meds  None          Last edited by Veronica Comings, MD on 07/01/2018  9:16 AM. (History)          Current Outpatient Medications:     tolterodine (DETROL LA) 4 MG 24 hr capsule, Take 1 capsule (4 mg total) by mouth daily    Swallow whole. Do not crush or chew., Disp: 90 capsule, Rfl: 3    naproxen sodium (ANAPROX) 220 MG tablet, Take 220 mg by mouth as needed   , Disp: , Rfl:     aspirin 81 MG tablet, Take 81 mg by mouth daily, Disp: , Rfl:     cephalexin (KEFLEX) 500 MG capsule, Take 1 capsule (500 mg total) by mouth daily as needed  WITH INTERCOURSE (Patient not taking: Reported on 07/01/2018), Disp: 15 capsule, Rfl: 1    PATADAY 0.2 % ophthalmic solution, INSTIL 1 DROP INTO BOTH EYES DAILY AS NEEDED, Disp: , Rfl: 6    mometasone (NASONEX) 50 MCG/ACT nasal spray, 2 sprays by Each Nare route daily, Disp: 17 g, Rfl: 5    EPINEPHrine (EPIPEN) 0.3 MG/0.3ML SOAJ injection, INJECT 0.3ML INTRAMUSCULARLY AS DIRECTED (Patient not taking: Reported on 07/01/2018), Disp: 1 Device, Rfl: 5  Sulfa antibiotics and No known latex allergy   No birth history on file.  Past Medical History:   Diagnosis Date    Cataract     Chronic kidney disease     IC (interstitial cystitis)     Macular edema     PERSISITENT PSEUDOPHAKIC OS    PVD (posterior vitreous detachment), right 05/21/2014    Refractive error 03/27/2011    Varicella     Vitreous hemorrhage 05/21/2014      Past Surgical  History:   Procedure Laterality Date    25G PPV/IVT/AFX/EL/22%SF6 OS  05/16/09    Avastin #1 OS 08/14/11 consent 08/14/11 maxitrol      barrier laser, right eye  05/20/14    Dr Denyce Robert    BARRIER LPC OS  07/30/09    BUNIONECTOMY      Hallux Valgus (Bunion) Correction Conversion Data     CATARACT REMOVAL  10/24/09    PC IOL OS    CESAREAN SECTION, CLASSIC      Cesarean Section Conversion Data     HX TONSILLECTOMY/ADENOIDECTOMY      Tonsillectomy Conversion Data     INTRA VITREAL TRIESENCE OS  05/30/10    4MG     PHACO/IOL/25G PPV/PCOX/MP/AFX/ICG/ILMX/14% C3 F8 OS  10/24/09    RETINAL DETACHMENT SURGERY      X 3    SB/20G PPV/MO/PFC/RTX/AFX/15% C3 F8 OS  06/20/09  Social History     Tobacco Use   Smoking Status Former Smoker    Types: Cigarettes    Last attempt to quit: 02/15/1981    Years since quitting: 37.3   Smokeless Tobacco Never Used      Social History     Substance and Sexual Activity   Alcohol Use Yes      Social History     Substance and Sexual Activity   Drug Use No      Specialty Problems        Ophthalmology Problems    Cataract        Cystoid macular edema of left eye        Refractive error        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 (History)       Objective:   Objective There were no vitals filed for this visit.    Base Eye Exam     Visual Acuity (Snellen - Linear)       Right Left    Dist cc 20/20 CF at 3' Peripheral    Correction:  Glasses          Tonometry (Tonopen, 8:50 AM)       Right Left    Pressure 15 17          Pupils       APD    Right None    Left +1          Extraocular Movement       Right Left      Full, Ortho Full, Ortho          Neuro/Psych     Oriented x3:  Yes    Mood/Affect:  Normal            Slit Lamp and Fundus Exam     External Exam       Right Left    External Normal Normal          Slit Lamp Exam       Right Left    Lids/Lashes Normal Normal    Conjunctiva/Sclera White and quiet White and quiet    Cornea Clear Clear    Anterior Chamber Deep and quiet Deep and quiet    Iris Round and reactive Round and reactive    Lens 1+ Nuclear sclerosis Posterior chamber intraocular lens; 3+ PCO    Vitreous Posterior vitreous detachment; mild VH clear          Fundus Exam       Right Left    Disc Normal Normal    C/D Ratio 0.3 0.5    Macula Normal SR band, resolved ME    Vessels Normal attenuated    Periphery Multiple tears scattered 360 all lasered with no new heme inf 180 Retx; attached, Laser scar, Macula attached, laser  Assessment/Plan:   Assessment 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

## 2018-07-01 NOTE — Assessment & Plan Note (Signed)
PVD with VH and retinal tears  --s/p barrier LPC OD (05/20/14)  --s/p barrier LPC OD (05/21/14)  --s/p barrier LPC OD (05/24/14)   --doing great, no new heme or tears  --f/u 1 yr OCT OU, dialted OU  --SSX of RD explained and understood    Chronic pseudophakic ME OS  HX OF RD repair X 3 (3/11), now resolved  Following yearly now    Hx RD repair OS  RD repair X 3 (3/11)  See above    PCIOL OS  -PCO OS, follow for now

## 2018-07-02 LAB — AEROBIC CULTURE

## 2018-07-04 ENCOUNTER — Telehealth: Payer: Self-pay | Admitting: Urology

## 2018-07-04 ENCOUNTER — Other Ambulatory Visit: Payer: Self-pay | Admitting: Urology

## 2018-07-04 LAB — AEROBIC CULTURE

## 2018-07-04 MED ORDER — NITROFURANTOIN MONOHYD MACRO 100 MG PO CAPS *I*
100.0000 mg | ORAL_CAPSULE | Freq: Two times a day (BID) | ORAL | 0 refills | Status: AC
Start: 2018-07-04 — End: 2018-07-09

## 2018-07-04 NOTE — Telephone Encounter (Signed)
Called and spoke with patient. States she has increased frequency and urgency. Denies fever, chills, or constipation. Thanks Noor Vidales       Notes recorded by Sessions, Anne Ng, MD on 07/04/2018 at 12:30 PM EST  Please notify patient of positive culture. I presume she is symptomatic and have sent a prescription for Macrobid to the pharmacy on record. Please document her symptoms.

## 2018-07-05 LAB — AEROBIC CULTURE

## 2018-07-06 ENCOUNTER — Other Ambulatory Visit: Payer: Self-pay

## 2018-07-06 ENCOUNTER — Encounter: Payer: Self-pay | Admitting: Primary Care

## 2018-07-06 ENCOUNTER — Ambulatory Visit: Payer: No Typology Code available for payment source | Attending: Primary Care | Admitting: Primary Care

## 2018-07-06 VITALS — BP 138/60 | HR 84 | Ht 62.0 in | Wt 204.9 lb

## 2018-07-06 DIAGNOSIS — Z23 Encounter for immunization: Secondary | ICD-10-CM | POA: Insufficient documentation

## 2018-07-06 DIAGNOSIS — Z124 Encounter for screening for malignant neoplasm of cervix: Secondary | ICD-10-CM | POA: Insufficient documentation

## 2018-07-06 DIAGNOSIS — Z0000 Encounter for general adult medical examination without abnormal findings: Secondary | ICD-10-CM | POA: Insufficient documentation

## 2018-07-06 DIAGNOSIS — N390 Urinary tract infection, site not specified: Secondary | ICD-10-CM | POA: Insufficient documentation

## 2018-07-06 NOTE — Addendum Note (Signed)
Addended by: Damian Leavell on: 07/06/2018 02:34 PM     Modules accepted: Orders
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
## 2018-07-12 ENCOUNTER — Other Ambulatory Visit: Payer: Self-pay

## 2018-07-12 ENCOUNTER — Ambulatory Visit: Payer: No Typology Code available for payment source | Attending: Primary Care | Admitting: Primary Care

## 2018-07-12 ENCOUNTER — Telehealth: Payer: Self-pay | Admitting: Primary Care

## 2018-07-12 ENCOUNTER — Encounter: Payer: Self-pay | Admitting: Primary Care

## 2018-07-12 VITALS — BP 148/82 | HR 74 | Temp 97.3°F | Ht 62.0 in | Wt 200.6 lb

## 2018-07-12 DIAGNOSIS — H8303 Labyrinthitis, bilateral: Secondary | ICD-10-CM

## 2018-07-12 DIAGNOSIS — K529 Noninfective gastroenteritis and colitis, unspecified: Secondary | ICD-10-CM

## 2018-07-12 MED ORDER — PROMETHAZINE HCL 25 MG PO TABS *I*
ORAL_TABLET | ORAL | 0 refills | Status: DC
Start: 2018-07-12 — End: 2020-08-13

## 2018-07-12 NOTE — Progress Notes (Signed)
Veronica Bishop is a 62 y.o. year old who has been ill for two days.  She has had a flu shot this year.  She has been very dizzy.  She has nystagmus.  She had this once before.  She has had nausea and vomiting.  She has had diarrhea.  She has not had a cough and no runny/stuffy nose.  She has not had itchy eyes, ears, nose or throat.  She has had fever and chills.  This was not measured .   She has myalgia.     She has not had sore throat.  She is not hoarse.  She has headache and no earache.  She has not been eating and has not been sleeping as well as usual due to n/v/d.   She has been taking tylenol with nosome symptom improvement.  She does not smoke.  [name] has traveled recently.  She is exposed to son in law and grandchildren with illness.    Patients meds and allergies are reviewed today and there are no changes in family history.  See electronic record for details.    BP 148/82    Pulse 74    Temp 36.3 C (97.3 F) (Temporal)    Ht 1.575 m (5\' 2" )    Wt 91 kg (200 lb 9.6 oz)    SpO2 94%    BMI 36.69 kg/m    GENERAL APPEARANCE: appears stated age, ill nontoxic  EYES: conjunctiva benign,   EARS: canals normal TM gray and shiny   MOUTH: wet, no exudate, oropharynx clear.   NECK: supple, 2 to 2 cm adenapathy in the anterior cervical chain  LUNGS: Clear to auscultation, no wheezing or crackles. No retractions.    HEART: regular rhythm and rate with no murmurs, JVP flat  ABDOMEN: soft, nontender, without hepatosplenomegaly or masses.  EXTREMITIES: Without clubbing, cyanosis, or edema  NEUROLOGIC: Alert and oriented times three, cranial nerves II through XII intact by observation, motor/sensory exam appear normal, normal gait.  SKIN:  no rash  PSYCH: alert, well groomed, good eye contact.    DISCUSSION: - 62 y.o. year old with fever, vomiting, diarrhea and abdominal pain with a benign, nonfocal exam    GASTROENTERITIS VIRAL SYNDROME     -Dehydration symptoms discussed including dry mouth, dry  eyes, lethargy.      - Rehydration discussed including fluids such as Pedialyte, Gatorade, soda with sugar.      -Dietary treatment, avoid fatty foods and milk products.  Use the BRAT diet including bananas, rice, applesauce and toast or tea and other similar foods.      -Use Tylenol for fever.    -Return for failure to stop vomiting in 24 hours or to stop diarrhea in three weeks or immediately for diarrhea with mucus and fever or for bloody diarrhea.  Also will follow-up to ED for fever with marked abdominal pain/vomiting.    DISCOMFORT  Tylenol 500 mg two every five hours as needed    VOMITING  Promethazine 25 mg one every six hours as needed to stop vomiting

## 2018-07-12 NOTE — Telephone Encounter (Signed)
Patient is asking since she has started getting sick to her stomach if she should keep her appointment today.

## 2018-07-15 LAB — GYN CYTOLOGY

## 2018-08-03 ENCOUNTER — Other Ambulatory Visit
Admission: RE | Admit: 2018-08-03 | Discharge: 2018-08-03 | Disposition: A | Discharge status: Home / Self Care | Payer: No Typology Code available for payment source | Source: Ambulatory Visit | Attending: Urology | Admitting: Urology

## 2018-08-03 DIAGNOSIS — N390 Urinary tract infection, site not specified: Secondary | ICD-10-CM | POA: Insufficient documentation

## 2018-08-03 LAB — URINALYSIS WITH MICROSCOPIC
Ketones, UA: NEGATIVE
Leuk Esterase,UA: NEGATIVE
Nitrite,UA: NEGATIVE
Protein,UA: NEGATIVE mg/dL
RBC,UA: 1 /hpf (ref 0–2)
Specific Gravity,UA: 1.015 (ref 1.002–1.030)
WBC,UA: 2 /hpf (ref 0–5)
pH,UA: 6 (ref 5.0–8.0)

## 2018-08-05 ENCOUNTER — Telehealth: Payer: Self-pay | Admitting: Urology

## 2018-08-05 LAB — AEROBIC CULTURE

## 2018-08-05 MED ORDER — CEPHALEXIN 250 MG PO CAPS *I*
250.0000 mg | ORAL_CAPSULE | Freq: Three times a day (TID) | ORAL | 0 refills | Status: AC
Start: 2018-08-05 — End: 2018-08-10

## 2018-08-05 NOTE — Telephone Encounter (Addendum)
Copied from [place] [id number]. Topic: Access to Care - Labs/Orders/Imaging  >> Aug 05, 2018  1:48 PM Delpha Perko, D'Nisha wrote:  Veronica Bishop is requesting a return call to review her recent test results.    What type of test was done? UA    Where was the test done? [facility]    On what day was the test done? 12/11    Patients preferred pharmacy is [id number] [place]    Please return the patients call at [phone].

## 2018-08-05 NOTE — Telephone Encounter (Signed)
Nitrofurantoin was not called in yet.     Patient notified that Keflex was sent in. It was sent to [place] and she is still in [place] so [facility] [place] and the were able to transfer the Rx to them. They will fill this today. Helyn App, RN

## 2018-08-05 NOTE — Telephone Encounter (Signed)
-----   Message from [employer], MD sent at 08/05/2018  3:27 PM EST -----  Please notify patient of positive urine culture.  However, corresponding urine analysis did not have any evidence of an inflammatory response.  Therefore, antibiotics typically not be recommended.  If she is having UTI symptoms, please document them.  If symptomatic, then telephone prescription for Macrobid (1 capsule by mouth twice daily for 5 days, #10 doses, no refills) to pharmacy, into order, and I will sign later.  If asymptomatic, then no treatment.

## 2018-08-05 NOTE — Telephone Encounter (Signed)
Patient reports she is symptomatic. She has burning with urination, frequency, and urgency. She denies fever or chills "yet".     She is upset that she is being prescribed the same antibiotic as the last two infections. She states the medication is clearly not working. It gives her temporary relief but the her symptoms return.     She is also upset that it took so long for our office to get back to her regarding this. She states she wouldn't have dropped of a urine if she wasn't symptomatic.     I explained that the culture was resistant to Cipro and Levaquin and then she is allergic to sulfa therefore her options for antibiotics are limited.     I did tell her that I would ask Dr. Barnetta Chapel if there are any other options. If I don't hear back from Dr. Barnetta Chapel before the end of the day I will phone in the nitrofurantoin per below so she doesn't go all weekend without any relief.     She is leaving for [place] on Sunday. If the medication is changed after Sunday [facility] [place] [place] (I made sure this is noted in the chart so it can be found under her pharmacies).

## 2018-08-05 NOTE — Addendum Note (Signed)
Addended by: Jamal Collin on: 08/05/2018 03:54 PM     Modules accepted: Orders

## 2018-08-05 NOTE — Telephone Encounter (Signed)
I will send a prescription for Keflex to the pharmacy.      Please contact pharmacy and cancel the nitrofurantoin.

## 2018-09-23 ENCOUNTER — Encounter: Payer: Self-pay | Admitting: Urology

## 2018-09-23 ENCOUNTER — Ambulatory Visit: Payer: No Typology Code available for payment source | Attending: Urology | Admitting: Urology

## 2018-09-23 VITALS — BP 161/94 | HR 77 | Ht 62.0 in | Wt 200.0 lb

## 2018-09-23 DIAGNOSIS — N390 Urinary tract infection, site not specified: Secondary | ICD-10-CM

## 2018-09-23 DIAGNOSIS — M6289 Other specified disorders of muscle: Secondary | ICD-10-CM

## 2018-09-23 DIAGNOSIS — R3915 Urgency of urination: Secondary | ICD-10-CM | POA: Insufficient documentation

## 2018-09-23 DIAGNOSIS — R102 Pelvic and perineal pain: Secondary | ICD-10-CM

## 2018-09-23 LAB — POCT URINALYSIS DIPSTICK
Blood,UA POCT: NEGATIVE
Glucose,UA POCT: NORMAL mg/dL
Ketones,UA POCT: NEGATIVE mg/dL
Leuk Esterase,UA POCT: NEGATIVE
Lot #: 40762302
Nitrite,UA POCT: NEGATIVE
PH,UA POCT: 7 (ref 5–8)
Protein,UA POCT: NEGATIVE mg/dL
Specific gravity,UA POCT: 1.01 (ref 1.002–1.030)

## 2018-09-23 LAB — URINALYSIS WITH MICROSCOPIC
Blood,UA: NEGATIVE
Ketones, UA: NEGATIVE
Leuk Esterase,UA: NEGATIVE
Nitrite,UA: NEGATIVE
Protein,UA: NEGATIVE mg/dL
RBC,UA: <1 /hpf (ref 0–2)
Specific Gravity,UA: 1.017 (ref 1.002–1.030)
WBC,UA: 6 /hpf — AB (ref 0–5)
pH,UA: 7 (ref 5.0–8.0)

## 2018-09-23 LAB — POCT BLADDER SCAN PVR

## 2018-09-23 NOTE — Progress Notes (Signed)
Veronica Bishop is a 63 y.o. female being seen in follow-up for the following:  1.  History of interstitial cystitis  Underwent cystoscopy with hydrodistention June 2008 with diffuse glomerulations  Bladder biopsies negative for malignancy  Symptoms initially controlled with Elmiron and Detrol  2.  Overactive bladder  Detrol LA for more effective than prior trial of Myrbetriq  3.  Urinary tract infections  Prior use of post coital Keflex; she has not been using it lately  4.  History of urethral dilation for possible urethral stricture and elevated postvoid residual  Prior use of tamsulosin; discontinued Spring 2018 with no significant impact on postvoid residual  5.  Pelvic floor muscle dysfunction    HPI:  Plan established as of 07/09/17:  1.  Urine analysis and culture whenever suspicious of urinary tract infection  Today's urine specimen sent for those studies  Patient still has available Keflex for post coital use; she has not been using it lately  2.  Remain off tamsulosin  3.  Continue Detrol LA 4  4.  Referral to [facility] Therapy for evaluation and treatment  In the interim, continue with previously recommended pelvic floor exercises  5.  Improve oral hydration.  Based on body weight, her minimal recommended daily water intake approaches 80 ounces  6.  Follow-up 4-6 months for reassessment of symptoms and postvoid residual, sooner if needed.    Patient complains of urinary urgency and suspects a UTI today. She also complains of mild dyspareunia with deep thrust. She attended pelvic floor physical therapy and underwent triggerpoint release with Donald Prose, PT. 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 She readily admits she does not perform pelvic floor physical therapy exercises regularly. She denies applying gentle heat for muscle relaxation.    Patient has gained 30 lbs and states she is under a great  amount of stress. She is worried about her blood pressure.    Daily fluid intake  -- water, 96 oz  -- decaffeinated tea, 1 cup  -- beer, 1 can      Allergies/Medications/PMH/PSH/FH all updated in EMR    ROS: No other interval changes in the review of systems    Current Outpatient Medications   Medication    promethazine (PHENERGAN) 25 MG tablet    tolterodine (DETROL LA) 4 MG 24 hr capsule    PATADAY 0.2 % ophthalmic solution    naproxen sodium (ANAPROX) 220 MG tablet    aspirin 81 MG tablet    EPINEPHrine (EPIPEN) 0.3 MG/0.3ML SOAJ injection    mometasone (NASONEX) 50 MCG/ACT nasal spray     No current facility-administered medications for this visit.        BP (!) 161/94    Pulse 77    Ht 1.575 m (5\' 2" )    Wt 90.7 kg (200 lb)    BMI 36.58 kg/m       PHYSICAL EXAM:  GENERAL: Well-developed, well-nourished, in no acute distress.    HEENT: Normocephalic, atraumatic.  Oropharynx clear.  Trachea midline  NEURO: Oriented to person, place, time, and situation  PSYCHIATRIC: Normal mood and affect  SKIN: Normal color.  Nondiaphoretic  LYMPHATIC: Normal cervical, supraclavicular examination   RESPIRATORY: Normal respiratory effort.  BACK/THORAX: No costovertebral angle tenderness.  No axial skeletal tenderness.   ABDOMEN: Abdomen soft, nontender, nondistended, and without masses  PULSES: Dorsalis pedis and posterior tibialis pulses palpable bilaterally and symmetric   EXTREMITIES: No  cyanosis, clubbing, or edema.  Calves nontender.      [Exam by Latina Craver, PA, on 12/24/16  External and Internal pelvic examination:  External genitalia -- without discharge, lesions, erythema, and edema.   The labia minora and introitus with slight pallor.  Urethral meatus -- very slight erythema but no obvious caruncle.   No discharge.   No displacement on Valsalva.  No pelvic organ prolapse on Valsalva.  No urinary incontinence with Valsalva.  Urethra and bladder neck -- there was tenderness of the urethra and bladder neck.  No  induration, mass or cyst noted.    No vaginal bleeding on exam.  Adnexa -- without masses or tenderness  No cervical motion tenderness.  Pelvic floor musculature -- smooth and with slight discomfort left anterior only.   Pelvic floor muscle control when asked to contract and to relax -- Good contraction and relaxation.]      PROCEDURE:    Bladder emptying evaluated this patient taking anticholinergic therapy and history of incomplete bladder emptying:  Postvoid residual as assessed by bladder scan on 09/23/18 recorded 64 mL  Postvoid residual as assessed by bladder scan on 07/09/17 recorded 90 mL  Postvoid residual as assessed by bladder scan on 12/24/16 recorded 47 mL      Prior Urine Cultures  Results for TRELLIS, GUIRGUIS (MRN [id number]) as of 09/23/2018 09:13   Ref. Range 08/26/2017 10:30 07/01/2018 11:35 07/02/2018 00:11 07/02/2018 00:11 08/03/2018 12:29   Aerobic Culture Unknown Escherichia coli (!) Escherichia coli (!) Lab Cancel Lab Cancel Escherichia coli (!)     Prior Urine Cytology   09/20/13:  Negative      Renal Ultrasound:   12-28-13:  IMPRESSION:  No evidence of hydronephrosis or focal lesions. Incidentally noted   Hepatic steatosis.  END OF IMPRESSION.    Cystoscopy with Dr. Vernon Prey 01/08/14              Findings:                        Anterior Urethra:  Normal, without evidence of narrowing or stricture disease.                                              No evidence of urethral diverticula.                        Bladder Neck:  No evidence of a bladder neck contracture or other abnormality                        Bladder:  Ureteral orifices in their normal anatomic positions on the trigone.                                              Appearance of ureteral orifices normal.  Clear efflux noted from each side.                        Urothelium:  No evidence of TCC                                              No other urothelial lesions found.                                                 0-1+ trabeculation noted.                                                                                                            No stones found.                        Bladder Capacity:  Normal    DISCUSSED:  We reviewed patient's interval urine culture; although there was bacterial growth on cultures, there was no evidence of inflammatory response in the urine analysis. Therefore, her symptoms are more likely due to her pelvic floor muscle dysfunction as opposed to a true urinary tract infection.  She acknowledged understanding and readily agrees with a voiding antibiotic therapy unless absolutely necessary.    We reviewed facilitating muscular relaxation through application of mild heat, gentle stretching exercises, yoga, meditation, and even mindfulness. I urged her to return to pelvic floor physical therapy and be more diligent with performing the exercises taught to her by Donald Prose, PT. As she benefited from trigger point therapy, she will also inquire with Specialty Physical Therapy about obtaining a vaginal dilator or wand for trigger point release at home. 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  Nonetheless, she is amenable to trying to get back on track with management of her symptoms.    Her questions were answered to her stated satisfaction, and we agreed on the plan outlined below.      ASSESSMENT:  1.  History of interstitial cystitis  Underwent cystoscopy with hydrodistention June 2008 with diffuse glomerulations  Bladder biopsies negative for malignancy  Symptoms initially controlled with Elmiron and Detrol  2.  Overactive bladder  Detrol LA for more effective than prior trial of Myrbetriq  3.  Urinary tract infections  Prior use of post coital Keflex; she has not been using it lately  4.  History of urethral dilation for possible urethral stricture and elevated postvoid residual  Prior use of  tamsulosin; discontinued Spring 2018 with no significant impact on postvoid residual  5.  Pelvic floor muscle dysfunction  Trigger point release with Donald Prose, PT    PLAN:  1.  Urine analysis and culture whenever suspicious of urinary tract infection  Today's urine specimen sent for those studies  No treatment if no inflammatory response is evident  2.  Remain off tamsulosin  3.  Continue Detrol LA 4  4.  Pelvic floor relaxation as instructed  Return to Donald Prose, PT for re-evaluation and treatment  5.  Improve oral hydration.  Based on body weight, her minimal recommended daily water intake approaches 100 ounces  6.  Follow-up in approximately 4-6 months for reassessment of symptoms and postvoid residual, sooner if needed.    Documentation prepared by Toniann Ket, acting as scribe for Dr. Anne Ng Dhiya Smits, MD.  09/23/2018 9:19 AM    All medical record entries made by the Scribe were at my direction and personally dictated by me, Dr. Barnetta Chapel. I have reviewed the chart and agree that the record accurately reflects my personal performance of the history, physical exam, assessment and plan. I have personally directed, reviewed, and agree with the discharge instructions.

## 2018-09-25 LAB — AEROBIC CULTURE

## 2018-09-27 ENCOUNTER — Telehealth: Payer: Self-pay | Admitting: Urology

## 2018-09-27 NOTE — Telephone Encounter (Signed)
Called and spoke with patient. Relayed below message to patient. Patient is not having any increased symptoms. Patient states she would not like to be treated with antibiotics at this time. States she will call back if her symptoms worsen. No further questions at this time. Thanks, [employer]

## 2018-09-27 NOTE — Telephone Encounter (Signed)
Notes recorded by Sessions, Anne Ng, MD on 09/26/2018 at 8:15 AM EST  Please notify patient that the urine culture is again positive; minimal inflammatory response noted on UA. If symptoms are typical of baseline, I would not recommend antibiotics. If increased, then Keflex 500 mg by mouth 3 times daily for 7 days, #21 doses, no refills.

## 2018-11-03 ENCOUNTER — Encounter: Payer: Self-pay | Admitting: Primary Care

## 2018-11-04 ENCOUNTER — Ambulatory Visit: Payer: No Typology Code available for payment source | Attending: Primary Care | Admitting: Primary Care

## 2018-11-04 VITALS — BP 152/88 | HR 79 | Temp 97.8°F | Ht 62.0 in | Wt 204.5 lb

## 2018-11-04 DIAGNOSIS — H8110 Benign paroxysmal vertigo, unspecified ear: Secondary | ICD-10-CM

## 2018-11-04 DIAGNOSIS — H6981 Other specified disorders of Eustachian tube, right ear: Secondary | ICD-10-CM

## 2018-11-04 NOTE — Progress Notes (Signed)
Veronica Bishop is a 63 y.o. year who has these acute problems:      Shantrice has spells of spinning.  Sometimes she has tinnitus at about the same time.  She has had pressure in her right ear lately and she is worried something bad is happening to her ears.  Her spinning doesn't happen when she turns her head.  It happens when she bends over and stands up.  She definitely has spinning.  She is worried she will have an episode when she is driving.    Patient Active Problem List   Diagnosis Code    Cystitis N30.90    Overactive bladder N32.81    UTI (lower urinary tract infection) N39.0    Cataract 366    Cystoid macular edema of left eye H35.352    Refractive error H52.7    IC (interstitial cystitis) N30.10    OAB (overactive bladder) N32.81    Interstitial cystitis N30.10    Urinary tract infection, site not specified N39.0    Incomplete bladder emptying R33.9    Joint pain, foot M25.579    Plantar fasciitis of right foot M72.2    Contracture, Achilles tendon M67.00    NASH (nonalcoholic steatohepatitis) K75.81    Dysuria R30.0    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, right H33.311    Vitreous hemorrhage H43.10    PVD (posterior vitreous detachment), right H43.811       Patients meds and allergies are reviewed today and there are no changes in family history.  See electronic record for details.    BP 152/88 (BP Location: Right arm, Patient Position: Sitting, Cuff Size: adult)    Pulse 79    Temp 36.6 C (97.8 F) (Temporal)    Ht 1.575 m (5\' 2" )    Wt 92.8 kg (204 lb 8 oz)    SpO2 94%    BMI 37.40 kg/m     GENERAL APPEARANCE: appears stated age, well appearing, no apparent distress.  EYES: conjunctiva clear   MOUTH: wet.  EARS: canals normal TM retracted righ,   MOUTH: wet, no exudate, oropharynx clear.  LUNGS: Clear to auscultation, no wheezing or crackles. No retractions.  HEART: regular rhythm and rate with no murmurs, JVP flat  EXTREMITIES: Without clubbing, cyanosis, or edema;    MENTAL STATUS: Alert and oriented times four.  Pleasant and cooperative with exam.  Speech fluent and coherent.  Mood and affect congruent.  Fund of knowledge appropriate for the conversation.  NEUROLOGIC: Cranial nerves II through XII functional and intact bilaterally by observation.  Motor strength bilaterally and symmetrical in all large muscle groups of the limbs observed to be wnl.  Gait appears normal. No ataxia or dysmetria observed to finger to nose.  Romberg negative.  Tandem gait normal.  SKIN:  no rash  PSYCH: cheerful, animated, well groomed, good eye contact.     DISCUSSION 63 y.o. year old  female with     EUSTACHIAN TUBE DYSFUNCTION  Pathophysiology explained.    BENIGN POSITIONAL VERTIGO vs. MENIERES  Pathophysiology explained and treatment discussed    FU as needed

## 2018-11-04 NOTE — Patient Instructions (Signed)
[email or web address].com/[email or web address]    New [facility] Treatment for Vertigo 2012 - [place] - Dr Carol Foster -

## 2018-11-22 ENCOUNTER — Other Ambulatory Visit: Payer: Self-pay | Admitting: Urology

## 2018-11-22 MED ORDER — TOLTERODINE TARTRATE 4 MG PO CP24 *I*
4.0000 mg | ORAL_CAPSULE | Freq: Every day | ORAL | 3 refills | Status: DC
Start: 2018-11-22 — End: 2019-12-04

## 2018-11-22 NOTE — Telephone Encounter (Signed)
Copied from [facility] [id number]. Topic: Medications/Prescriptions - Refill Request  >> Nov 22, 2018 10:25 AM Tory Emerald wrote:  Tyrena Gohr Girardin calling to request prescription(s) tolterodine (DETROL LA) 4 MG 24 hr capsule to be sent to the following Pharmacy [id number] [place], [place].    Is patient out of the medication? yes      Patient can be reached if necessary at [phone] (home).

## 2019-03-06 ENCOUNTER — Encounter: Payer: Self-pay | Admitting: Family Medicine

## 2019-03-06 ENCOUNTER — Telehealth: Payer: Self-pay | Admitting: Primary Care

## 2019-03-06 ENCOUNTER — Ambulatory Visit: Payer: No Typology Code available for payment source | Admitting: Family Medicine

## 2019-03-06 VITALS — BP 128/70 | HR 69 | Temp 96.4°F | Ht 62.0 in | Wt 196.9 lb

## 2019-03-06 DIAGNOSIS — H60391 Other infective otitis externa, right ear: Secondary | ICD-10-CM

## 2019-03-06 MED ORDER — CIPROFLOXACIN-DEXAMETHASONE 0.3-0.1 % OT SUSP *I*
4.0000 [drp] | Freq: Two times a day (BID) | OTIC | 0 refills | Status: DC
Start: 2019-03-06 — End: 2020-08-13

## 2019-03-06 NOTE — Telephone Encounter (Incomplete)
Dr walker- note from 11/04/2018 with pcp- seen for ? Vertigo and ear pain, in addition she was concerned something is happening to her ears,     She says she is

## 2019-03-06 NOTE — Telephone Encounter (Signed)
Patient is coming into office for assessment.

## 2019-03-06 NOTE — Patient Instructions (Addendum)
Right otitis externa:  -Ciprodex prescribed.      Otitis Externa   WHAT YOU NEED TO KNOW:   Otitis externa, or swimmer's ear, is an infection in the outer ear canal. This canal goes from the outside of the ear to the eardrum.      DISCHARGE INSTRUCTIONS:   Return to the emergency department if:    You have severe ear pain.     You are suddenly unable to hear at all.     You have new swelling in your face, behind your ears, or in your neck.     You suddenly cannot move part of your face.     Your face suddenly feels numb.    Contact your healthcare provider if:    You have a fever.      Your signs and symptoms do not get better after 2 days of treatment.      Your signs and symptoms go away for a time, but then come back.      You have questions or concerns about your condition or care.    Medicines:    NSAIDs , such as ibuprofen, help decrease swelling, pain, and fever. This medicine is available with or without a doctor's order. NSAIDs can cause stomach bleeding or kidney problems in certain people. If you take blood thinner medicine, always ask if NSAIDs are safe for you. Always read the medicine label and follow directions. Do not give these medicines to children under 22 months of age without direction from your child's healthcare provider.      Acetaminophen  decreases pain and fever. It is available without a doctor's order. Ask how much to take and how often to take it. Follow directions. Acetaminophen can cause liver damage if not taken correctly.     Ear drops  that contain an antibiotic may be given. The antibiotic helps treat a bacterial infection. You may also be given steroid medicine. The steroid helps decrease redness, swelling, and pain.      Take your medicine as directed.  Contact your healthcare provider if you think your medicine is not helping or if you have side effects. Tell him or her if you are allergic to any medicine. Keep a list of the medicines, vitamins, and herbs you take.  Include the amounts, and when and why you take them. Bring the list or the pill bottles to follow-up visits. Carry your medicine list with you in case of an emergency.    Follow up with your healthcare provider as directed:  Write down your questions so you remember to ask them during your visits.  How to use eardrops:    Lie down on your side with your infected ear facing up.     Carefully drip the correct number of eardrops into your ear. Have another person help you if possible.     Gently move the outside part of your ear back and forth to help the medicine reach your ear canal.      Stay lying down in the same position (with your ear facing up) for 3 to 5 minutes.    Prevent otitis externa:    Do not put cotton swabs or foreign objects in your ears.     Wrap a clean moist washcloth around your finger, and use it to clean your outer ear and remove extra ear wax.      Use ear plugs when you swim. Dry your outer ears completely after you swim  or bathe.     Copyright [employer] 2019 Information is for [employer] use only and may not be sold, redistributed or otherwise used for commercial purposes. All illustrations and images included in CareNotes are the copyrighted property of [employer]M., Inc[employer] or [employer]  The above information is an educational aid only. It is not intended as medical advice for individual conditions or treatments. Talk to your doctor, nurse or pharmacist before following any medical regimen to see if it is safe and effective for you.

## 2019-03-06 NOTE — Telephone Encounter (Signed)
Patient calling due to possible swimmers ear .

## 2019-03-06 NOTE — Progress Notes (Signed)
[facility] [place]: Outpatient Acute Visit Note      Chief Complaint   Patient presents with    Follow-up     right ear pain feels like pressure ringing in ear          SUBJECTIVE    Veronica Bishop  is a 63 y.o. female  here for an acute visit today:    Today we discussed:    Right ear pain began 2 days ago.  She admits that [place] and usually when she does that she will get external ear infections.  It feels like she has pressure in her right ear.  There are no other upper respiratory symptoms including nasal congestion, postnasal drip, cough or sore throat.  She has not had any direct injury to the right ear.    REVIEW OF SYSTEMS  ROS  As above    The patient's medications were reviewed and reconciled with the patient; and Yes changes were made. The patient's allergies and past medical history were reviewed with the patient and updated/populated in eRecord.     OBJECTIVE  Vitals:    03/06/19 1524   BP: 128/70   BP Location: Right arm   Patient Position: Sitting   Cuff Size: adult   Pulse: 69   Temp: 35.8 C (96.4 F)   TempSrc: Temporal   SpO2: 96%   Weight: 89.3 kg (196 lb 14.4 oz)   Height: 1.575 m (5\' 2" )     Body mass index is 36.01 kg/m.      Physical Exam  HENT:      Right Ear: Hearing and external ear normal. No decreased hearing noted. Tenderness present. No laceration, drainage or swelling. No middle ear effusion. There is no impacted cerumen. No foreign body. No mastoid tenderness. No PE tube. No hemotympanum. Tympanic membrane is erythematous. Tympanic membrane is not injected, scarred, perforated, retracted or bulging.      Left Ear: Hearing, ear canal and external ear normal. No decreased hearing noted. No laceration, drainage, swelling or tenderness.  No middle ear effusion. There is no impacted cerumen. No foreign body. No mastoid tenderness. No PE tube. No hemotympanum. Tympanic membrane is not injected, scarred, perforated,  erythematous, retracted or bulging.      Ears:      Comments: No tenderness to palpation of external ear bilaterally.  Right ear canal is mildly erythematous with some mild erythema on the TM as well.  There is no notable sign of middle ear effusion.      Gen: appears well, NAD   Psych: normal mood and affect      ASSESSMENT & PLAN    Problem List Items Addressed This Visit     None      Visit Diagnoses     Acute infective otitis externa, right    -  Primary    Relevant Medications    ciprofloxacin-dexAMETHasone (CIPRODEX) otic suspension        Right otitis externa:  Ciprodex prescribed.    Patient Education   Otitis Externa   WHAT YOU NEED TO KNOW:   Otitis externa, or swimmer's ear, is an infection in the outer ear canal. This canal goes from the outside of the ear to the eardrum.      DISCHARGE INSTRUCTIONS:   Return to the emergency department if:    You have severe ear pain.     You are suddenly unable to  hear at all.     You have new swelling in your face, behind your ears, or in your neck.     You suddenly cannot move part of your face.     Your face suddenly feels numb.    Contact your healthcare provider if:    You have a fever.      Your signs and symptoms do not get better after 2 days of treatment.      Your signs and symptoms go away for a time, but then come back.      You have questions or concerns about your condition or care.    Medicines:    NSAIDs , such as ibuprofen, help decrease swelling, pain, and fever. This medicine is available with or without a doctor's order. NSAIDs can cause stomach bleeding or kidney problems in certain people. If you take blood thinner medicine, always ask if NSAIDs are safe for you. Always read the medicine label and follow directions. Do not give these medicines to children under 5 months of age without direction from your child's healthcare provider.      Acetaminophen  decreases pain and fever. It is available without a doctor's order. Ask how much to  take and how often to take it. Follow directions. Acetaminophen can cause liver damage if not taken correctly.     Ear drops  that contain an antibiotic may be given. The antibiotic helps treat a bacterial infection. You may also be given steroid medicine. The steroid helps decrease redness, swelling, and pain.      Take your medicine as directed.  Contact your healthcare provider if you think your medicine is not helping or if you have side effects. Tell him or her if you are allergic to any medicine. Keep a list of the medicines, vitamins, and herbs you take. Include the amounts, and when and why you take them. Bring the list or the pill bottles to follow-up visits. Carry your medicine list with you in case of an emergency.    Follow up with your healthcare provider as directed:  Write down your questions so you remember to ask them during your visits.  How to use eardrops:    Lie down on your side with your infected ear facing up.     Carefully drip the correct number of eardrops into your ear. Have another person help you if possible.     Gently move the outside part of your ear back and forth to help the medicine reach your ear canal.      Stay lying down in the same position (with your ear facing up) for 3 to 5 minutes.    Prevent otitis externa:    Do not put cotton swabs or foreign objects in your ears.     Wrap a clean moist washcloth around your finger, and use it to clean your outer ear and remove extra ear wax.      Use ear plugs when you swim. Dry your outer ears completely after you swim or bathe.     Copyright [employer] 2019 Information is for [employer] use only and may not be sold, redistributed or otherwise used for commercial purposes. All illustrations and images included in CareNotes are the copyrighted property of [employer] or [employer]  The above information is an educational aid only. It is not intended as medical advice for individual conditions or treatments.  Talk to your doctor, nurse or pharmacist before following any  medical regimen to see if it is safe and effective for you.           No follow-ups on file.    Constance Goltz, MD      This note has been dictated using Editor, commissioning.  Reasonable attempts to correct typing mistakes have been done.  Please excuse any inherent inaccuracy that might have escaped.

## 2019-03-17 ENCOUNTER — Ambulatory Visit: Payer: No Typology Code available for payment source | Admitting: Urology

## 2019-03-27 ENCOUNTER — Other Ambulatory Visit
Admission: RE | Admit: 2019-03-27 | Discharge: 2019-03-27 | Disposition: A | Discharge status: Home / Self Care | Payer: No Typology Code available for payment source | Source: Ambulatory Visit | Attending: Urology | Admitting: Urology

## 2019-03-27 DIAGNOSIS — N390 Urinary tract infection, site not specified: Secondary | ICD-10-CM

## 2019-03-27 LAB — URINALYSIS WITH MICROSCOPIC
Blood,UA: NEGATIVE
Glucose,UA: NEGATIVE mg/dL
Hyaline Casts,UA: NONE SEEN /lpf (ref 0–2)
Ketones, UA: NEGATIVE
Leuk Esterase,UA: NEGATIVE
Nitrite,UA: POSITIVE — AB
Protein,UA: NEGATIVE mg/dL
Specific Gravity,UA: 1.024 (ref 1.002–1.030)
pH,UA: 6 (ref 5.0–8.0)

## 2019-03-29 LAB — AEROBIC CULTURE

## 2019-03-30 ENCOUNTER — Other Ambulatory Visit: Payer: Self-pay | Admitting: Urology

## 2019-03-30 MED ORDER — NITROFURANTOIN MONOHYD MACRO 100 MG PO CAPS *I*
100.0000 mg | ORAL_CAPSULE | Freq: Two times a day (BID) | ORAL | 0 refills | Status: AC
Start: 2019-03-30 — End: 2019-04-04

## 2019-03-31 ENCOUNTER — Telehealth: Payer: Self-pay | Admitting: Urology

## 2019-03-31 NOTE — Telephone Encounter (Signed)
Dr. Barnetta Chapel,     Patient complaining of frequency, urgency   4/10 Burning with urination  Denies fever, chills, hematuria or constipation  Drinks 96-110 oz/day  Informed of below. States understanding. Thanks [employer], Anne Ng, MD  [facility] [place] Nurse              Please notify patient of positive culture. On the presumption that she is symptomatic, a prescription for Macrobid has been sent to the pharmacy on record. Please document her symptoms.   Of note, if not symptomatic, then antibiotics are not required or recommended.

## 2019-04-18 ENCOUNTER — Other Ambulatory Visit: Payer: Self-pay | Admitting: Gastroenterology

## 2019-06-30 ENCOUNTER — Ambulatory Visit: Payer: No Typology Code available for payment source | Attending: Retina Ophthalmology | Admitting: Retina Ophthalmology

## 2019-06-30 DIAGNOSIS — H59032 Cystoid macular edema following cataract surgery, left eye: Secondary | ICD-10-CM | POA: Insufficient documentation

## 2019-06-30 DIAGNOSIS — H33311 Horseshoe tear of retina without detachment, right eye: Secondary | ICD-10-CM | POA: Insufficient documentation

## 2019-06-30 NOTE — Assessment & Plan Note (Signed)
PVD with VH and retinal tears  --s/p barrier LPC OD (05/20/14)  --s/p barrier LPC OD (05/21/14)  --s/p barrier LPC OD (05/24/14)   --doing great, no new heme or tears  --f/u 1 yr OCT OU, dilated OU  --SSX of RD explained and understood    Chronic pseudophakic ME OS  HX OF RD repair X 3 (3/11), now resolved    Hx RD repair OS  RD repair X 3 (3/11)  See above    PCIOL OS  -PCO OS, follow for now

## 2019-06-30 NOTE — Progress Notes (Signed)
Subjective:   Subjective 06/30/2019   Chief Complaint   Patient presents with    Annual Exam     dilate OU     HPI     Annual Exam      Additional comments: dilate OU              Comments      Veronica Bishop is a 63 y.o. female presents for her 1 year follow up   for PVD with VH. DV and NV is stable since LEE OU. No pain or discomfort   OU.     PVD with VH and retinal tears, right eye  --s/p barrier LPC OD x 3 (05/20/14), (05/21/14), and (05/24/14)  Chronic pseudophakic ME OS   Hx RD repair OS x 3 (3/11)   PCIOL OS  --PCO OS                  Last edited by Carmel Sacramento on 06/30/2019 10:26 AM. (History)          Current Outpatient Medications:     ciprofloxacin-dexAMETHasone (CIPRODEX) otic suspension, Place 4 drops into the right ear 2 times daily, Disp: 7.5 mL, Rfl: 0    tolterodine (DETROL LA) 4 MG 24 hr capsule, Take 1 capsule (4 mg total) by mouth daily  Swallow whole. Do not crush or chew., Disp: 90 capsule, Rfl: 3    promethazine (PHENERGAN) 25 MG tablet, One every six hours as needed for nausea and vomiting (Patient not taking: Reported on 11/04/2018), Disp: 10 tablet, Rfl: 0    PATADAY 0.2 % ophthalmic solution, INSTIL 1 DROP INTO BOTH EYES DAILY AS NEEDED, Disp: , Rfl: 6    mometasone (NASONEX) 50 MCG/ACT nasal spray, 2 sprays by Each Nare route daily (Patient not taking: Reported on 03/06/2019), Disp: 17 g, Rfl: 5    naproxen sodium (ANAPROX) 220 MG tablet, Take 220 mg by mouth as needed   , Disp: , Rfl:     aspirin 81 MG tablet, Take 81 mg by mouth daily, Disp: , Rfl:     EPINEPHrine (EPIPEN) 0.3 MG/0.3ML SOAJ injection, INJECT 0.3ML INTRAMUSCULARLY AS DIRECTED, Disp: 1 Device, Rfl: 5  Sulfa antibiotics and No known latex allergy   No birth history on file.  Past Medical History:   Diagnosis Date    Cataract     Chronic kidney disease     IC (interstitial cystitis)     Macular edema     PERSISITENT PSEUDOPHAKIC OS    PVD (posterior vitreous detachment), right 05/21/2014    Refractive error  03/27/2011    Varicella     Vitreous hemorrhage 05/21/2014      Past Surgical History:   Procedure Laterality Date    25G PPV/IVT/AFX/EL/22%SF6 OS  05/16/09    Avastin #1 OS 08/14/11 consent 08/14/11 maxitrol      barrier laser, right eye  05/20/14    Dr Denyce Robert    BARRIER LPC OS  07/30/09    BUNIONECTOMY      Hallux Valgus (Bunion) Correction Conversion Data     CATARACT REMOVAL  10/24/09    PC IOL OS    CESAREAN SECTION, CLASSIC      Cesarean Section Conversion Data     HX TONSILLECTOMY/ADENOIDECTOMY      Tonsillectomy Conversion Data     INTRA VITREAL TRIESENCE OS  05/30/10    4MG     PHACO/IOL/25G PPV/PCOX/MP/AFX/ICG/ILMX/14% C3 F8 OS  10/24/09    RETINAL DETACHMENT SURGERY  X 3    SB/20G PPV/MO/PFC/RTX/AFX/15% C3 F8 OS  06/20/09      Social History     Tobacco Use   Smoking Status Former Smoker    Types: Cigarettes    Quit date: 02/15/1981    Years since quitting: 38.3   Smokeless Tobacco Never Used      Social History     Substance and Sexual Activity   Alcohol Use Yes      Social History     Substance and Sexual Activity   Drug Use No      Specialty Problems        Ophthalmology Problems    Cataract        Cystoid macular edema of left eye        Refractive error        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 (History)       Objective:   Objective There were no vitals filed for this visit.    Base Eye Exam     Visual Acuity (Snellen - Linear)       Right Left    Dist cc 20/30 CF at 3'    Correction: Glasses          Tonometry (Tonopen, 10:30 AM)       Right Left    Pressure 14 18          Pupils       Pupils React APD     Right PERRLA  None    Left PERRLA Minimal None          Neuro/Psych     Oriented x3: Yes    Mood/Affect: Normal          Dilation     Both eyes: 2.5% Phenylephrine, 1.0% Tropicamide @ 10:31 AM            Slit Lamp and Fundus Exam     External Exam       Right Left    External Normal Normal          Slit Lamp Exam       Right Left    Lids/Lashes Normal Normal    Conjunctiva/Sclera White and quiet White and quiet    Cornea Clear Clear    Anterior Chamber Deep and quiet Deep and quiet    Iris Round and reactive Round and reactive    Lens 1+ Nuclear sclerosis Posterior chamber intraocular lens; 3+ PCO    Vitreous Posterior vitreous detachment; mild VH clear          Fundus Exam       Right Left    Disc Normal Normal    C/D Ratio 0.3 0.5    Macula Normal SR band, resolved ME    Vessels Normal attenuated    Periphery Multiple tears scattered 360 all lasered with no new heme inf 180 Retx; attached, Laser scar, Macula attached, laser  Assessment/Plan:   Assessment 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

## 2019-12-04 ENCOUNTER — Other Ambulatory Visit: Payer: Self-pay | Admitting: Urology

## 2020-01-18 ENCOUNTER — Other Ambulatory Visit: Payer: Self-pay | Admitting: Urology

## 2020-01-18 MED ORDER — TOLTERODINE TARTRATE 4 MG PO CP24 *I*
4.0000 mg | ORAL_CAPSULE | Freq: Every day | ORAL | 3 refills | Status: DC
Start: 2020-01-18 — End: 2021-01-21

## 2020-01-18 NOTE — Telephone Encounter (Signed)
Last office visit nov 2020

## 2020-01-18 NOTE — Telephone Encounter (Signed)
Copied from [place] [id number]. Topic: Medications/Prescriptions - Refill Request  >> Jan 18, 2020 11:24 AM Fara Boros A wrote:  Veronica Bishop is calling to request medication refills.      Please see Clinical Call attached for details.    Veronica Bishop calling to request prescription(s) tolterodine (DETROL LA) 4 MG 24 hr capsule to be sent to the following Pharmacy [id number] [place], [place]   Phone:  [phone]  Fax:  [phone]      .    Is patient out of the medication? No 3 days left    Patient's last visit? 09/23/18    Patient can be reached if necessary at [phone].

## 2020-04-17 ENCOUNTER — Other Ambulatory Visit: Payer: Self-pay | Admitting: Gastroenterology

## 2020-06-09 NOTE — Progress Notes (Signed)
Veronica Bishop is a 64 y.o. who had left back pain since May.  It includes pain in her left low back down to her buttock and down to her posterior knee    She has more pain with sitting and feels a bit better after she gets up and walks for a while.  Being very active also makes it worse.   She has taken some aleve and applied heat with some relief.  She has not had these symptoms before.   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    This started with round house kicks.     This patient has had no weight loss, no fever, no prolonged uses of prednisone, no recent falls or accidents.  She has had no diagnosis of osteoporosis and is over 50.     This patient has had no history of cancer, no immunosuppression drugs, no HIV, no of IV drugs, no recent uti, respiratory tract or other infection.     This patient has had no abdominal pain, takes no blood thinners such as aspirin, plavix, coumadin.     This patient has had no trouble initiating urination, no loss of bowel control, no numbness in groin or genital area.  This patient has no loss of strength in the legs.     Patients meds and allergies are reviewed today and there are no changes in family history.  See electronic record for details.    BP 158/88    Pulse 91    Temp 36.9 C (98.5 F)    Ht 1.575 m (5' 2.01")    Wt 89.8 kg (198 lb)    SpO2 98%    BMI 36.21 kg/m   GENERAL APPEARANCE: appears stated age, well appearing, no apparent distress.  EYES: conjunctiva benign,   MOUTH: wet.  EXTREMITIES: Without clubbing, cyanosis, or edema; STRAIGHT LEG RAISES MILDLY POSITIVE LEFT SIDED; pain in left hip with adduction >> abduction; pain in right hip with adduction.  BACK: Tight muscles BILATERALLY above the iliac crest.  No CVA tenderness.  NEUROLOGIC: Motor strength bilaterally 5/5 and symmetrical in all large muscle groups of the limbs.  Normal sensory ability to light touch in the lower extremities.  Tendon reflexes 2+ bilaterally and symmetrical in the knee, toes are downgoing.   Gait appears  NORMAL  SKIN:  no rash  PSYCH: cheerful, animated, well groomed, good eye contact.    DISCUSSION 64 y.o. with PAIN LOW LEFT BACK    stretch, ice,     IBUPROFEN 400 tid prn or NAPROXEN 375 MG BID      handouts/yoga instructions given.    fu if not better in two weeks for referral to PT  or for weakness or urgently for changes in bowel or bladder function

## 2020-06-11 ENCOUNTER — Ambulatory Visit: Payer: No Typology Code available for payment source | Admitting: Primary Care

## 2020-06-11 ENCOUNTER — Encounter: Payer: Self-pay | Admitting: Primary Care

## 2020-06-11 VITALS — BP 138/86 | HR 91 | Temp 98.5°F | Ht 62.01 in | Wt 198.0 lb

## 2020-06-11 DIAGNOSIS — M5442 Lumbago with sciatica, left side: Secondary | ICD-10-CM

## 2020-06-11 MED ORDER — CYCLOBENZAPRINE HCL 10 MG PO TABS *I*
10.0000 mg | ORAL_TABLET | Freq: Three times a day (TID) | ORAL | 0 refills | Status: DC | PRN
Start: 2020-06-11 — End: 2020-08-13

## 2020-06-13 ENCOUNTER — Ambulatory Visit: Payer: No Typology Code available for payment source | Admitting: Primary Care

## 2020-06-28 ENCOUNTER — Telehealth: Payer: Self-pay | Admitting: Primary Care

## 2020-06-28 ENCOUNTER — Ambulatory Visit: Payer: No Typology Code available for payment source | Attending: Retina Ophthalmology | Admitting: Retina Ophthalmology

## 2020-06-28 DIAGNOSIS — Z961 Presence of intraocular lens: Secondary | ICD-10-CM | POA: Insufficient documentation

## 2020-06-28 DIAGNOSIS — H35352 Cystoid macular degeneration, left eye: Secondary | ICD-10-CM | POA: Insufficient documentation

## 2020-06-28 DIAGNOSIS — Z139 Encounter for screening, unspecified: Secondary | ICD-10-CM

## 2020-06-28 DIAGNOSIS — H59032 Cystoid macular edema following cataract surgery, left eye: Secondary | ICD-10-CM

## 2020-06-28 DIAGNOSIS — H43811 Vitreous degeneration, right eye: Secondary | ICD-10-CM | POA: Insufficient documentation

## 2020-06-28 NOTE — Telephone Encounter (Signed)
Labs ordered.

## 2020-06-28 NOTE — Telephone Encounter (Signed)
Patient called as she was sitting at the lab and labs had not been ordered.    Advised that PCP was with a patient and that I would send her a message to order these labs for her.    She states "I just drove 11 miles to get my labs done and we talked about it at my prior appointment on 06/11/20"    Please order what is acceptable and contact patient back at [phone].    Patient has an upcoming appointment on Friday, 07/05/20.

## 2020-06-28 NOTE — Assessment & Plan Note (Signed)
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

## 2020-06-28 NOTE — Progress Notes (Signed)
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 States no ocular changes noted X last visit.            Last edited by Johnell Comings, MD on 06/28/2020  9:02 AM. (History)          Current Outpatient Medications:     cyclobenzaprine (FLEXERIL) 10 mg tablet, Take 1 tablet (10 mg total) by mouth 3 times daily as needed for Muscle spasms  1/2 to 1 up to three times a day as needed for muscle spasms, Disp: 30 tablet, Rfl: 0    tolterodine (DETROL LA) 4 MG 24 hr capsule, Take 1 capsule (4 mg total) by mouth daily  Swallow whole. Do not crush or chew., Disp: 90 capsule, Rfl: 3    ciprofloxacin-dexAMETHasone (CIPRODEX) otic suspension, Place 4 drops into the right ear 2 times daily, Disp: 7.5 mL, Rfl: 0    promethazine (PHENERGAN) 25 MG tablet, One every six hours as needed for nausea and vomiting, Disp: 10 tablet, Rfl: 0    PATADAY 0.2 % ophthalmic solution, INSTIL 1 DROP INTO BOTH EYES DAILY AS NEEDED, Disp: , Rfl: 6    naproxen sodium (ANAPROX) 220 MG tablet, Take 220 mg by mouth as needed   , Disp: , Rfl:     aspirin 81 MG tablet, Take 81 mg by mouth daily, Disp: , Rfl:     EPINEPHrine (EPIPEN) 0.3 MG/0.3ML SOAJ injection, INJECT 0.3ML INTRAMUSCULARLY AS DIRECTED, Disp: 1 Device, Rfl: 5    mometasone (NASONEX) 50 MCG/ACT nasal spray, 2 sprays by Each Nare route daily (Patient not taking: Reported on 03/06/2019), Disp: 17 g, Rfl: 5  Sulfa antibiotics and No known latex allergy   No birth history on file.  Past Medical History:   Diagnosis Date    Cataract     Chronic kidney disease      IC (interstitial cystitis)     Macular edema     PERSISITENT PSEUDOPHAKIC OS    PVD (posterior vitreous detachment), right 05/21/2014    Refractive error 03/27/2011    Varicella     Vitreous hemorrhage 05/21/2014      Past Surgical History:   Procedure Laterality Date    25G PPV/IVT/AFX/EL/22%SF6 OS  05/16/09    Avastin #1 OS 08/14/11 consent 08/14/11 maxitrol      barrier laser, right eye  05/20/14    Dr Denyce Robert    BARRIER LPC OS  07/30/09    BUNIONECTOMY      Hallux Valgus (Bunion) Correction Conversion Data     CATARACT REMOVAL  10/24/09    PC IOL OS    CESAREAN SECTION, CLASSIC      Cesarean Section Conversion Data     HX TONSILLECTOMY/ADENOIDECTOMY      Tonsillectomy Conversion Data     INTRA VITREAL TRIESENCE OS  05/30/10    4MG     PHACO/IOL/25G PPV/PCOX/MP/AFX/ICG/ILMX/14% C3 F8 OS  10/24/09    RETINAL DETACHMENT SURGERY      X 3    SB/20G PPV/MO/PFC/RTX/AFX/15% C3 F8 OS  06/20/09      Social History     Tobacco Use   Smoking Status Former Smoker    Types: Cigarettes    Quit date: 02/15/1981    Years since quitting: 39.3   Smokeless Tobacco Never Used      Social History     Substance and Sexual Activity   Alcohol Use Yes      Social History     Substance and Sexual Activity   Drug Use No      Specialty Problems        Ophthalmology Problems    Cataract        Cystoid macular edema of left eye        Refractive error        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 (History)       Objective:   Objective There were no vitals filed for this visit.    Base Eye Exam     Visual Acuity (Snellen - Linear)       Right Left     Dist cc 20/20 20/400    Dist ph cc  NI    Correction: Glasses          Tonometry (Tonopen, 8:36 AM)       Right Left    Pressure 20 19          Pupils       Shape APD    Right Round None    Left Round None          Neuro/Psych     Oriented x3: Yes    Mood/Affect: Normal          Dilation     Both eyes: 2.5% Phenylephrine, 1.0% Tropicamide @ 8:36 AM            Slit Lamp and Fundus Exam     External Exam       Right Left    External Normal Normal          Slit Lamp Exam       Right Left    Lids/Lashes Normal Normal    Conjunctiva/Sclera White and quiet White and quiet    Cornea Clear Clear    Anterior Chamber Deep and quiet Deep and quiet    Iris Round and reactive Round and reactive    Lens 1+ Nuclear sclerosis Posterior chamber intraocular lens; 3+ PCO    Vitreous Posterior vitreous detachment; mild VH clear          Fundus Exam       Right Left    Disc Normal Normal    C/D Ratio 0.3 0.5    Macula Normal SR band, resolved ME    Vessels Normal attenuated    Periphery Multiple tears scattered 360 all lasered with  no new heme inf 180 Retx; attached, Laser scar, Macula attached, laser                                   Assessment/Plan:   Assessment Cystoid macular edema of left eye  Chronic pseudophakic ME OS HX OF RD repair X 3 (3/11), now resolved  F/u 12 month OCT and for re-evaluation    PVD OD  --no tears    Hx RD repair OS  RD repair X 3 (3/11)  See above    PCIOL OS  -PCO OS, follow for now    Lattice OD  - no tears, SSx of RD explained and understood

## 2020-07-01 ENCOUNTER — Other Ambulatory Visit
Admission: RE | Admit: 2020-07-01 | Discharge: 2020-07-01 | Disposition: A | Discharge status: Home / Self Care | Payer: No Typology Code available for payment source | Source: Ambulatory Visit | Attending: Primary Care | Admitting: Primary Care

## 2020-07-01 DIAGNOSIS — N390 Urinary tract infection, site not specified: Secondary | ICD-10-CM

## 2020-07-01 DIAGNOSIS — Z139 Encounter for screening, unspecified: Secondary | ICD-10-CM | POA: Insufficient documentation

## 2020-07-01 LAB — LIPID PANEL
Chol/HDL Ratio: 3.5
Cholesterol: 177 mg/dL
HDL: 50 mg/dL (ref 40–60)
LDL Calculated: 100 mg/dL
Non HDL Cholesterol: 127 mg/dL
Triglycerides: 135 mg/dL

## 2020-07-01 LAB — URINALYSIS WITH MICROSCOPIC
Blood,UA: NEGATIVE
Glucose,UA: NEGATIVE
Hyaline Casts,UA: NONE SEEN /lpf (ref 0–5)
Ketones, UA: NEGATIVE
Leuk Esterase,UA: NEGATIVE
Nitrite,UA: NEGATIVE
Protein,UA: NEGATIVE
Specific Gravity,UA: 1.005 (ref 1.002–1.030)
pH,UA: 6.5 (ref 5.0–8.0)

## 2020-07-01 LAB — BASIC METABOLIC PANEL
Anion Gap: 9 (ref 7–16)
CO2: 28 mmol/L (ref 20–28)
Calcium: 9.9 mg/dL (ref 8.6–10.2)
Chloride: 105 mmol/L (ref 96–108)
Creatinine: 0.71 mg/dL (ref 0.51–0.95)
GFR,Black: 105 *
GFR,Caucasian: 91 *
Glucose: 116 mg/dL — ABNORMAL HIGH (ref 60–99)
Lab: 8 mg/dL (ref 6–20)
Potassium: 4.1 mmol/L (ref 3.3–5.1)
Sodium: 142 mmol/L (ref 133–145)

## 2020-07-01 LAB — HEMOGLOBIN A1C: Hemoglobin A1C: 5.6 %

## 2020-07-03 ENCOUNTER — Telehealth: Payer: Self-pay | Admitting: Urology

## 2020-07-03 LAB — AEROBIC CULTURE

## 2020-07-03 NOTE — Telephone Encounter (Signed)
Please contact patient.  Final urine culture did have some bacterial growth, but the corresponding urine analysis was without any evidence of inflammation.  Therefore, antibiotics are not typically recommended.    If symptomatic, please document those symptoms and offer either Keflex 500 mg by mouth 3 times daily for 7 days (#21 doses, no refills), or Macrobid (100 mg by mouth twice daily for 5 days, #10 doses, no refills).  That prescription can be called into the pharmacy, or it can be entered for signing.    Again, if not really having UTI symptoms, I would not recommend antibiotics.

## 2020-07-04 MED ORDER — CEPHALEXIN 500 MG PO CAPS *I*
500.0000 mg | ORAL_CAPSULE | Freq: Three times a day (TID) | ORAL | 0 refills | Status: AC
Start: 2020-07-04 — End: 2020-07-11

## 2020-07-04 NOTE — Telephone Encounter (Signed)
Patient called back and she was unable to sleep last night. Having some burning and frequency. [facility]acy [facility] [place], on file

## 2020-07-04 NOTE — Telephone Encounter (Signed)
Called patient to inform patient of the below information she states that she just got off the patient and a script was being sent over

## 2020-07-04 NOTE — Addendum Note (Signed)
Addended by: Loraine Maple on: 07/04/2020 09:02 AM     Modules accepted: Orders
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
## 2020-07-04 NOTE — Addendum Note (Signed)
Addended by: Jamal Collin on: 07/04/2020 12:33 PM     Modules accepted: Orders

## 2020-07-05 ENCOUNTER — Encounter: Payer: Self-pay | Admitting: Primary Care

## 2020-07-05 ENCOUNTER — Ambulatory Visit: Payer: No Typology Code available for payment source | Admitting: Primary Care

## 2020-07-05 VITALS — BP 132/86 | HR 96 | Temp 95.8°F | Ht 61.73 in | Wt 197.4 lb

## 2020-07-05 DIAGNOSIS — Z0000 Encounter for general adult medical examination without abnormal findings: Secondary | ICD-10-CM

## 2020-07-05 DIAGNOSIS — N390 Urinary tract infection, site not specified: Secondary | ICD-10-CM

## 2020-07-05 DIAGNOSIS — M545 Low back pain, unspecified: Secondary | ICD-10-CM

## 2020-07-05 DIAGNOSIS — Z23 Encounter for immunization: Secondary | ICD-10-CM

## 2020-07-05 MED ORDER — EPINEPHRINE 0.3 MG/0.3ML IJ SOAJ *I*
INTRAMUSCULAR | 1 refills | Status: DC
Start: 2020-07-05 — End: 2021-09-02

## 2020-08-06 ENCOUNTER — Ambulatory Visit (INDEPENDENT_AMBULATORY_CARE_PROVIDER_SITE_OTHER): Payer: PRIVATE HEALTH INSURANCE

## 2020-08-06 ENCOUNTER — Ambulatory Visit (HOSPITAL_COMMUNITY)
Admission: EM | Admit: 2020-08-06 | Discharge: 2020-08-06 | Disposition: A | Discharge status: Home / Self Care | Payer: PRIVATE HEALTH INSURANCE | Attending: Family Medicine | Admitting: Family Medicine

## 2020-08-06 ENCOUNTER — Other Ambulatory Visit: Payer: Self-pay

## 2020-08-06 ENCOUNTER — Encounter (HOSPITAL_COMMUNITY): Payer: Self-pay

## 2020-08-06 DIAGNOSIS — R059 Cough, unspecified: Secondary | ICD-10-CM | POA: Diagnosis not present

## 2020-08-06 DIAGNOSIS — R0602 Shortness of breath: Secondary | ICD-10-CM | POA: Diagnosis not present

## 2020-08-06 MED ORDER — HYDROCODONE-HOMATROPINE 5-1.5 MG/5ML PO SYRP: 5.0000 mL | ORAL_SOLUTION | Freq: Four times a day (QID) | ORAL | 0 refills | Status: DC | PRN

## 2020-08-06 MED ORDER — PREDNISONE 20 MG PO TABS: 40.0000 mg | ORAL_TABLET | Freq: Every day | ORAL | 0 refills | Status: AC

## 2020-08-06 MED ORDER — HYDROCOD POLST-CPM POLST ER 10-8 MG/5ML PO SUER: 5.0000 mL | Freq: Two times a day (BID) | ORAL | 0 refills | Status: AC | PRN

## 2020-08-06 NOTE — ED Triage Notes (Signed)
Presents today with persistent coughing without oxygen desaturations. Hx of bronchitis. She has been taking cough drops and nyquil without any improvement. Pt has good air movement with bilateral clear breath sounds but R posterior more diminished than Left. Sats are > 98% but pt placed on 2L Oxygen via [place] for comfort.

## 2020-08-06 NOTE — Discharge Instructions (Signed)
Be aware, your cough medication may cause drowsiness. Please do not drive, operate heavy machinery or make important decisions while on this medication, it can cloud your judgement.  

## 2020-08-06 NOTE — ED Provider Notes (Signed)
[facility]   [id number] 08/06/20 Arrival Time: 1049  ASSESSMENT & PLAN:  1. SOB (shortness of breath)   2. Cough     Traveling back to [place] via car. Plan f/u with PCP for re-check and to monitor BP.  I have personally viewed the imaging studies ordered this visit. No infiltrates or signs of infection.  Begin: Meds ordered this encounter  Medications  . predniSONE (DELTASONE) 20 MG tablet    Sig: Take 2 tablets (40 mg total) by mouth daily.    Dispense:  10 tablet    Refill:  0  . DISCONTD: HYDROcodone-homatropine (HYCODAN) 5-1.5 MG/5ML syrup    Sig: Take 5 mLs by mouth every 6 (six) hours as needed for cough.    Dispense:  90 mL    Refill:  0  . chlorpheniramine-HYDROcodone (TUSSIONEX PENNKINETIC ER) 10-8 MG/5ML SUER    Sig: Take 5 mLs by mouth every 12 (twelve) hours as needed for cough.    Dispense:  60 mL    Refill:  0   Addendum: Her pharmacy out of Hycodan; Tussionex sent.   Follow-up Information    [facility] EMERGENCY DEPARTMENT.   Specialty: Emergency Medicine Why: If symptoms worsen in any way. Contact information: [address] [id number] mc [place] [address] [phone]               Discharge Instructions     Be aware, your cough medication may cause drowsiness. Please do not drive, operate heavy machinery or make important decisions while on this medication, it can cloud your judgement.    \   Reviewed expectations re: course of current medical issues. Questions answered. Outlined signs and symptoms indicating need for more acute intervention. Understanding verbalized. After Visit Summary given.   SUBJECTIVE: History from: patient. Joyce Castillo is a 64 y.o. female who presents with persistent cough; feeling tired/bad; 5 days. No SOB. Unsure if wheezing at times. Afebrile. Cough is affecting sleep. Normal PO intake without n/v/d. Visiting from [place].  Social History   Tobacco Use   Smoking Status Former Smoker  . Types: Cigarettes  . Quit date: 08/24/1978  . Years since quitting: 41.9  Smokeless Tobacco Never Used     OBJECTIVE:  Vitals:   08/06/20 1125 08/06/20 1127 08/06/20 1128  BP: (!) 159/126    Pulse: 91    Resp: (!) 22    Temp: 98.9 F (37.2 C)    TempSrc: Oral    SpO2: 100%  100%  Weight:  88.5 kg   Height:  5\' 2"  (1.575 m)     General appearance: alert; no distress Eyes: PERRLA; EOMI; conjunctiva normal HENT: ; AT; with nasal congestion Neck: supple  Lungs: speaks full sentences without difficulty; unlabored; CTAB; deep active cough Extremities: no edema Skin: warm and dry Neurologic: normal gait Psychological: alert and cooperative; normal mood and affect  Imaging: DG Chest 2 View  Result Date: 08/06/2020 CLINICAL DATA:  Shortness of breath and cough. EXAM: CHEST - 2 VIEW COMPARISON:  None. FINDINGS: The heart size and mediastinal contours are within normal limits. There is no evidence of pulmonary edema, consolidation, pneumothorax, nodule or pleural fluid. The visualized skeletal structures are unremarkable. IMPRESSION: No active cardiopulmonary disease. Electronically Signed   By: 08/08/2020 M.D.   On: 08/06/2020 12:25    Allergies  Allergen Reactions  . Sulfa Antibiotics     Past Medical History:  Diagnosis Date  . UTI (urinary tract infection)  bladder d/t cystitis   Social History   Socioeconomic History  . Marital status: Married    Spouse name: Not on file  . Number of children: Not on file  . Years of education: Not on file  . Highest education level: Not on file  Occupational History  . Not on file  Tobacco Use  . Smoking status: Former Smoker    Types: Cigarettes    Quit date: 08/24/1978    Years since quitting: 41.9  . Smokeless tobacco: Never Used  Vaping Use  . Vaping Use: Never used  Substance and Sexual Activity  . Alcohol use: Yes    Comment: occasionally  . Drug use: Never  . Sexual  activity: Yes  Other Topics Concern  . Not on file  Social History Narrative  . Not on file   Social Determinants of Health   Financial Resource Strain: Not on file  Food Insecurity: Not on file  Transportation Needs: Not on file  Physical Activity: Not on file  Stress: Not on file  Social Connections: Not on file  Intimate Partner Violence: Not on file   Family History  Problem Relation Age of Onset  . Stroke Mother   . Stroke Father   . Heart attack Father    Past Surgical History:  Procedure Laterality Date  . CESAREAN SECTION     x2  . EYE SURGERY       Mardella Layman, MD 08/06/20 1259

## 2020-08-09 ENCOUNTER — Telehealth: Payer: Self-pay | Admitting: Primary Care

## 2020-08-09 ENCOUNTER — Other Ambulatory Visit: Payer: Self-pay | Admitting: Primary Care

## 2020-08-09 ENCOUNTER — Encounter: Payer: Self-pay | Admitting: Primary Care

## 2020-08-09 MED ORDER — HYDROCOD POLST-CPM POLST ER 10-8 MG/5ML PO SUER *I*
ORAL | 0 refills | Status: DC
Start: 2020-08-09 — End: 2020-08-12

## 2020-08-09 NOTE — Telephone Encounter (Signed)
Patient called back stating that she has sent mychart messages with information to the office and states that she 'is dying' and wants to 'speak to a nurse'.

## 2020-08-09 NOTE — Telephone Encounter (Signed)
Please do an I stop, thanks.

## 2020-08-09 NOTE — Telephone Encounter (Signed)
I stop done

## 2020-08-09 NOTE — Telephone Encounter (Signed)
Patient states that her pharmacy in [place] did not fill her hydrocodone for two days and now she is back in [place].    States that she was given prednisone and was in urgent care while there.    Is asking to speak to a nurse.

## 2020-08-12 ENCOUNTER — Ambulatory Visit: Payer: No Typology Code available for payment source | Admitting: Medical

## 2020-08-12 ENCOUNTER — Telehealth: Payer: Self-pay | Admitting: Primary Care

## 2020-08-12 ENCOUNTER — Other Ambulatory Visit: Payer: Self-pay | Admitting: Primary Care

## 2020-08-12 MED ORDER — HYDROCOD POLST-CPM POLST ER 10-8 MG/5ML PO SUER *I*
ORAL | 0 refills | Status: AC
Start: 2020-08-12 — End: 2020-08-26

## 2020-08-12 NOTE — Telephone Encounter (Signed)
Spoke to patient and was advised that she took 2 rapid tests.    Has had this going on for 3 weeks now.    Scheduled for 10:30 12/21

## 2020-08-12 NOTE — Telephone Encounter (Signed)
Same day ok.  Find out if it was a quick test or an overnight test.  If quick test PPE.

## 2020-08-12 NOTE — Telephone Encounter (Signed)
How far apart were the rapid tests?

## 2020-08-12 NOTE — Telephone Encounter (Signed)
Spoke with patient. She states that symptoms started 12 days ago. A week ago she was in [place] and went to UC for cough and required O2, cough medicine and steroid. Unable to get cough medicine and has noted minimal improvement.     Looks like we attempted to order cough medicine as well. [facility]. Also endorses a headache that started on Saturday.     Fully vaccinated 11/2019  COVID tested negative but did not specify when. Records from UC visit in [place] encounter.    Requesting an appointment today or tomorrow if possible

## 2020-08-12 NOTE — Telephone Encounter (Addendum)
Left message    Patient states she was in urgent care over the week end and records have been sent.  Have not seen them yet.    Believes she has a bad sinus infection and wants to be seen by someone today.    danielle is out, not sure where/if we can schedule her.

## 2020-08-12 NOTE — Telephone Encounter (Signed)
Appt with PCP tomorrow should be ok for reassessment if she was seen at [facility] a few days ago

## 2020-08-12 NOTE — Telephone Encounter (Signed)
Received fax from [facility] store # [phone]  Requesting new rX for hydrocodone -chlorphen former script is missing max daily dose .         Last office visit:   07/05/2020  Patients upcoming appointments:  Future Appointments   Date Time Provider [place]   08/13/2020 10:30 AM Deiss, Dani Gobble, MD [facility] None   07/04/2021  8:30 AM Diloreto, Shanon Brow, MD [facility] None     Recent Lab results:  GENERAL CHEMISTRY   Recent Labs     07/01/20  1104   NA 142   K 4.1   CL 105   CO2 28   GAP 9   UN 8   CREAT 0.71   GFRC 91   GFRB 105   GLU 116*   CA 9.9      LIPID PROFILE   Recent Labs     07/01/20  1104   CHOL 177   TRIG 135   HDL 50   LDLC 100      LIVER PROFILE   No value within the past 365 days   DIABETES THYROID   Recent Labs     07/01/20  1104   HA1C 5.6    No value within the past 365 days      Pending/Orders Labs:  Lab Frequency Next Occurrence   Urinalysis with microscopic PRN    Aerobic culture (AER, Voided Urine) PRN

## 2020-08-12 NOTE — Telephone Encounter (Signed)
Please advise as there are only same days available this week

## 2020-08-12 NOTE — Telephone Encounter (Signed)
I stop done

## 2020-08-13 ENCOUNTER — Encounter: Payer: Self-pay | Admitting: Primary Care

## 2020-08-13 ENCOUNTER — Ambulatory Visit: Payer: No Typology Code available for payment source | Attending: Primary Care | Admitting: Primary Care

## 2020-08-13 VITALS — BP 140/84 | HR 81 | Temp 97.4°F

## 2020-08-13 DIAGNOSIS — Z20822 Contact with and (suspected) exposure to covid-19: Secondary | ICD-10-CM | POA: Insufficient documentation

## 2020-08-13 DIAGNOSIS — Z20828 Contact with and (suspected) exposure to other viral communicable diseases: Secondary | ICD-10-CM | POA: Insufficient documentation

## 2020-08-13 DIAGNOSIS — J209 Acute bronchitis, unspecified: Secondary | ICD-10-CM | POA: Insufficient documentation

## 2020-08-13 LAB — DATE/TIME NOT PROVIDED

## 2020-08-13 MED ORDER — PREDNISONE 20 MG PO TABS *I*
ORAL_TABLET | ORAL | 0 refills | Status: DC
Start: 2020-08-13 — End: 2021-01-06

## 2020-08-13 MED ORDER — ALBUTEROL SULFATE HFA 108 (90 BASE) MCG/ACT IN AERS *I*
2.0000 | INHALATION_SPRAY | Freq: Four times a day (QID) | RESPIRATORY_TRACT | 0 refills | Status: DC | PRN
Start: 2020-08-13 — End: 2020-09-02

## 2020-08-13 NOTE — Progress Notes (Signed)
Have you had covid in the past? never  Have you had the covid vaccine? Four weeks ago booster  Veronica Bishop is a 64 y.o. year old who has been ill for two weeks.  She got much worse on the 13th.  Tested for covid on Friday with a home test.  She is not exposed to her husband with illness.  She works at home.  She has had a flu shot this year.  She has had a cough and runny/stuffy nose.  She has not had itchy eyes, ears, nose or throat.  She has had no fever and no chills.  This was not measured .   She has myalgia.  She has not had loss of sense of smell or taste.   She has had sore throat.  She is hoarse.  She has headache and no earache.  Veronica Bishop has had no nausea, vomiting or no diarrhea.  She has not been eating as well and has not been sleeping as well as usual due to cough.   She has been taking cough meds with some symptom improvement.  She does not  smoke.  Veronica Bishop has traveled recently [place].    Patients meds and allergies are reviewed today and there are no changes in family history.  See electronic record for details.    Patients meds reviewed today and there are no changes in family history.    BP 140/84 (BP Location: Right arm, Patient Position: Sitting, Cuff Size: adult)    Pulse 81    Temp 36.3 C (97.4 F) (Temporal)    SpO2 96%   GENERAL APPEARANCE: appears stated age, coughing  EYES: conjunctiva benign,   EARS: canals normal TM gray and shiny   MOUTH: wet, no exudate, oropharynx clear. Nose stuffy  NECK: supple, 2 to 2 cm adenapathy in the anterior cervical chain  LUNGS: Clear to auscultation, no wheezing no crackles. No retractions.    HEART: regular rhythm and rate with no murmurs  EXTREMITIES: Without clubbing, cyanosis, or edema  NEUROLOGIC: Alert and oriented times three, cranial nerves II through XII intact by observation, motor/sensory exam appear normal,  SKIN:  no rash  PSYCH: alert, well groomed, good eye contact.    DISCUSSION  64 y.o. with nonfocal complaints and  exam    VIRAL SYNDROME  With persistent cough    cough - Albuterol MDI 2 puffs QID nebulizer/spacer given    Prednisone 40 mg one a day for three days    Tussinex 5 ml at bedtime while trying to sleep    DISCOMFORT  Tylenol 500 mg two every five hours as needed    Follow-up immediately for increased work of breathing, being out of it, inconsolable, or if without gradual improvement after 7 days.    Do not return to work/school until covid results are back; isolate from others (family will need to isolate until results are known).

## 2020-08-14 ENCOUNTER — Telehealth: Payer: Self-pay | Admitting: Primary Care

## 2020-08-14 ENCOUNTER — Encounter: Payer: Self-pay | Admitting: Primary Care

## 2020-08-14 LAB — COVID-19 NAAT (PCR): COVID-19 NAAT (PCR): NEGATIVE

## 2020-08-14 LAB — COVID-19 PCR

## 2020-08-14 NOTE — Telephone Encounter (Signed)
She got prednisone and albuterol; we got a request for tussionex.

## 2020-08-14 NOTE — Telephone Encounter (Addendum)
Attempted to contact pharmacy several times got a busy signal   Faxed over script at 1:29 with written note asking them for clarification . Stating patient was RX tussinex  on 08/12/20 , and was also sent albuteral , prednisone over .

## 2020-08-14 NOTE — Telephone Encounter (Signed)
She doesn't need a second dose.  Someone requested a second script, I had assumed that the pharmacy did not accept the first dose since we get so many scripts five times in a day.

## 2020-08-14 NOTE — Telephone Encounter (Signed)
Received fax from pharmacy   Per pharmacy  Patient requests new RX : did you mean to send Tussionex again? Already here filled

## 2020-08-14 NOTE — Telephone Encounter (Signed)
Called pharmacy spoke to cassidy .  Read message below they are aware . Patient does not need a second dose and will contact patient.

## 2020-08-14 NOTE — Telephone Encounter (Signed)
Called pharmacy spoke to casidy .  Pharmacy is asking if patient was suppose to get two sperate scripts for tussionex   Informed pharmacy first script was sent on 08/09/20 because they would not accept an out of state script.  Pharmacy now stating it is too early to refill tussionex and they are requesting a maxium dose.  Requested pharmacy for future purposes if they can specify everything they need clarified on fax as our office is busy assisting patients in office.

## 2020-08-14 NOTE — Telephone Encounter (Signed)
No need to route it . Keep task in bin [place] calls . Will also try to call pharmacy again to see what they are asking .

## 2020-08-31 ENCOUNTER — Other Ambulatory Visit: Payer: Self-pay | Admitting: Primary Care

## 2020-09-02 NOTE — Telephone Encounter (Signed)
Last office visit:   08/13/2020  Patients upcoming appointments:  Future Appointments   Date Time Provider [place]   07/04/2021  8:30 AM Diloreto, Shanon Brow, MD [facility] None     Recent Lab results:  GENERAL CHEMISTRY   Recent Labs     07/01/20  1104   NA 142   K 4.1   CL 105   CO2 28   GAP 9   UN 8   CREAT 0.71   GFRC 91   GFRB 105   GLU 116*   CA 9.9      LIPID PROFILE   Recent Labs     07/01/20  1104   CHOL 177   TRIG 135   HDL 50   LDLC 100      LIVER PROFILE   No value within the past 365 days   DIABETES THYROID   Recent Labs     07/01/20  1104   HA1C 5.6    No value within the past 365 days      Pending/Orders Labs:  Lab Frequency Next Occurrence   Urinalysis with microscopic PRN    Aerobic culture (AER, Voided Urine) PRN

## 2020-10-01 ENCOUNTER — Other Ambulatory Visit: Payer: Self-pay | Admitting: Primary Care

## 2020-10-01 NOTE — Telephone Encounter (Signed)
Last office visit:   08/13/2020  Patients upcoming appointments:  Future Appointments   Date Time Provider [place]   07/04/2021  8:30 AM Diloreto, Shanon Brow, MD [facility] None     Recent Lab results:  GENERAL CHEMISTRY   Recent Labs     07/01/20  1104   NA 142   K 4.1   CL 105   CO2 28   GAP 9   UN 8   CREAT 0.71   GFRC 91   GFRB 105   GLU 116*   CA 9.9      LIPID PROFILE   Recent Labs     07/01/20  1104   CHOL 177   TRIG 135   HDL 50   LDLC 100      LIVER PROFILE   No value within the past 365 days   DIABETES THYROID   Recent Labs     07/01/20  1104   HA1C 5.6    No value within the past 365 days      Pending/Orders Labs:  Lab Frequency Next Occurrence   Urinalysis with microscopic PRN    Aerobic culture (AER, Voided Urine) PRN

## 2021-01-06 ENCOUNTER — Encounter: Payer: Self-pay | Admitting: Medical

## 2021-01-06 ENCOUNTER — Ambulatory Visit: Payer: No Typology Code available for payment source | Admitting: Medical

## 2021-01-06 VITALS — BP 130/82 | HR 72 | Ht 61.0 in | Wt 203.3 lb

## 2021-01-06 DIAGNOSIS — M7052 Other bursitis of knee, left knee: Secondary | ICD-10-CM

## 2021-01-06 MED ORDER — PREDNISONE 20 MG PO TABS *I*
20.0000 mg | ORAL_TABLET | Freq: Every day | ORAL | 0 refills | Status: DC
Start: 2021-01-06 — End: 2021-03-04

## 2021-01-06 NOTE — Progress Notes (Signed)
[facility] [place]: Outpatient Acute Visit Note    Subjective:  Veronica Bishop is a 65 y.o. female  here for an acute visit today for the following issue:    Chief Complaint   Patient presents with    Follow-up     pain in both knees .      Patient is presenting with pain in both of her knees, left > right.  She explains that she was giving her granddaughter a bath and kneeling on her knees last weekend, which made them sore.  A few days later when she knelt on her left knee, she felt shooting pain.  She only experiences this pain when she kneels on it. No pain with positional changes or standing/bearing weight.  The knee is not swollen. Pain does not radiate up or down the leg. Denies numbness or tingling.   She has not taken any OTC analgesics for it.    ROS:   As above.    The patient's medications were reviewed and reconciled with the patient.  The patient's allergies were reviewed and confirmed with the patient.  Pertinent portions of the patient's past medical, surgical, social and/or family histories were reviewed and updated.      Objective:  BP 130/82 (BP Location: Left arm, Patient Position: Sitting, Cuff Size: adult)    Pulse 72    Ht 1.549 m (5\' 1" )    Wt 92.2 kg (203 lb 4.8 oz)    SpO2 96%    BMI 38.41 kg/m    General: appears well, NAD, normal mood and affect; answers questions appropriately  Lungs: clear to auscultation bilaterally with no rales, wheezes, or rhonchi  CV: RRR, no murmur, rub or gallop  Knee Exam: Left knee(s) No swelling, erythema, ecchymoses, or deformity. No palpable effusion or warmth. Moderate point tenderness to palpation inferior to patella. Remainder of knee is nontender to palpation.  Full range of motion symmetric to opposite side. Neurovascularly intact.  Stable to ligamentous testing with negative anterior drawer, Lachman, posterior drawer, valgus and varus stress.   Negative McMurray's.  Negative patellar grind.   Lower Extremities: no edema      Assessment and Plan:    1. Infrapatellar bursitis of left knee  predniSONE (DELTASONE) 20 mg tablet     Patient's knee pain is only with pressure on knee; suspected bursitis. There may be underlying arthritis component but I explained I do not think this is causing her acute pain because she does not have pain with mobility, walking or bearing weight on the extremity.  Offered different anti-inflammatory treatment options including NSAIDs or steroid therapy. She would like the latter.  Burst of prednisone 20 mg x5 days sent. Possible side effects discussed. Also recommended avoiding excessive kneeling. Ice on the knee as tolerated.  Advised patient to monitor for symptom worsening or lack of improvement.     The following patient instructions were reviewed verbally with the patient, printed on the after visit summary and given to patient at the end of the visit.    Patient Instructions     Patient Education   Knee Bursitis   WHAT YOU NEED TO KNOW:   Knee bursitis is inflammation of the bursa in your knee. The bursa is a fluid-filled sac that acts as a cushion between a bone and a tendon. A tendon is a cord of strong tissue that connects muscles to bones.     DISCHARGE INSTRUCTIONS:   Contact your healthcare provider  if:    Your pain and swelling increase.     Your symptoms do not improve with treatment.     You have a fever.     You have questions or concerns about your condition or care.    Medicines:  You may need any of the following:   NSAIDs , such as ibuprofen, help decrease swelling, pain, and fever. This medicine is available with or without a doctor's order. NSAIDs can cause stomach bleeding or kidney problems in certain people. If you take blood thinner medicine, always ask if NSAIDs are safe for you. Always read the medicine label and follow directions. Do not give these medicines to children under 68 months of age without direction from your child's healthcare provider.      Antibiotics  help  fight an infection caused by bacteria.     Take your medicine as directed.  Contact your healthcare provider if you think your medicine is not helping or if you have side effects. Tell him of her if you are allergic to any medicine. Keep a list of the medicines, vitamins, and herbs you take. Include the amounts, and when and why you take them. Bring the list or the pill bottles to follow-up visits. Carry your medicine list with you in case of an emergency.    Manage your symptoms:    Rest  your knee as much as possible to decrease pain and swelling. Slowly start to do more each day. Return to your daily activities as directed.      Apply ice  to help decrease swelling and pain. Ice may also help prevent tissue damage. Use an ice pack, or put crushed ice in a plastic bag. Cover it with a towel and place it on your knee for 15 to 20 minutes, 3 to 4 times each day, as directed.     Apply heat  to help decrease pain and stiffness. Apply heat on the area for 15 to 20 minutes, 3 to 4 times each day, as directed.     Apply compression  to decrease swelling. Healthcare providers may wrap your knee with tape or an elastic bandage to decrease swelling. Loosen the elastic bandage if you start to lose feeling in your toes.     Elevate  your knee above the level of your heart as often as you can. This will help decrease swelling and pain. Prop your knee on pillows or blankets to keep it elevated comfortably.      Go to physical therapy  if directed. A physical therapist can teach you exercises to improve your range of motion and increase knee strength.    Prevent knee bursitis:    Stretch, warm up, and cool down when you exercise.  This will help loosen your muscles and decrease stress on your knees. Rest between workouts.     Protect your knees.  Use kneepads when you kneel on a hard surface and when you play sports. Stand and walk around every 20 minutes if you have to kneel for a long period of time.    Follow up  with your healthcare provider as directed:  Write down your questions so you remember to ask them during your visits.    Copyright [employer] Information is for [employer] use only and may not be sold, redistributed or otherwise used for commercial purposes. All illustrations and images included in CareNotes are the copyrighted property of A.D.A.M., Inc. or [employer]  The  above information is an educational aid only. It is not intended as medical advice for individual conditions or treatments. Talk to your doctor, nurse or pharmacist before following any medical regimen to see if it is safe and effective for you.       Patient is instructed to call office if symptoms worsen or do not improve with above recommended treatment.    Patient is to keep next scheduled appt as previously scheduled.   Future Appointments       Provider [place]    07/04/2021 8:30 AM Diloreto, Shanon Brow, MD [facility] [place], [place]  Family Medicine    This note has been dictated using Editor, commissioning.  Reasonable attempts to correct typing mistakes have been done.  Please excuse any inherent inaccuracy that might have escaped.

## 2021-01-06 NOTE — Patient Instructions (Signed)
Patient Education   Knee Bursitis   WHAT YOU NEED TO KNOW:   Knee bursitis is inflammation of the bursa in your knee. The bursa is a fluid-filled sac that acts as a cushion between a bone and a tendon. A tendon is a cord of strong tissue that connects muscles to bones.     DISCHARGE INSTRUCTIONS:   Contact your healthcare provider if:   · Your pain and swelling increase.    · Your symptoms do not improve with treatment.    · You have a fever.    · You have questions or concerns about your condition or care.    Medicines:  You may need any of the following:  · NSAIDs , such as ibuprofen, help decrease swelling, pain, and fever. This medicine is available with or without a doctor's order. NSAIDs can cause stomach bleeding or kidney problems in certain people. If you take blood thinner medicine, always ask if NSAIDs are safe for you. Always read the medicine label and follow directions. Do not give these medicines to children under 6 months of age without direction from your child's healthcare provider.     · Antibiotics  help fight an infection caused by bacteria.    · Take your medicine as directed.  Contact your healthcare provider if you think your medicine is not helping or if you have side effects. Tell him of her if you are allergic to any medicine. Keep a list of the medicines, vitamins, and herbs you take. Include the amounts, and when and why you take them. Bring the list or the pill bottles to follow-up visits. Carry your medicine list with you in case of an emergency.    Manage your symptoms:   · Rest  your knee as much as possible to decrease pain and swelling. Slowly start to do more each day. Return to your daily activities as directed.     · Apply ice  to help decrease swelling and pain. Ice may also help prevent tissue damage. Use an ice pack, or put crushed ice in a plastic bag. Cover it with a towel and place it on your knee for 15 to 20 minutes, 3 to 4 times each day, as directed.    · Apply heat   to help decrease pain and stiffness. Apply heat on the area for 15 to 20 minutes, 3 to 4 times each day, as directed.    · Apply compression  to decrease swelling. Healthcare providers may wrap your knee with tape or an elastic bandage to decrease swelling. Loosen the elastic bandage if you start to lose feeling in your toes.    · Elevate  your knee above the level of your heart as often as you can. This will help decrease swelling and pain. Prop your knee on pillows or blankets to keep it elevated comfortably.     · Go to physical therapy  if directed. A physical therapist can teach you exercises to improve your range of motion and increase knee strength.    Prevent knee bursitis:   · Stretch, warm up, and cool down when you exercise.  This will help loosen your muscles and decrease stress on your knees. Rest between workouts.    · Protect your knees.  Use kneepads when you kneel on a hard surface and when you play sports. Stand and walk around every 20 minutes if you have to kneel for a long period of time.    Follow up   with your healthcare provider as directed:  Write down your questions so you remember to ask them during your visits.   © Copyright [employer] 2020 Information is for End User's use only and may not be sold, redistributed or otherwise used for commercial purposes. All illustrations and images included in CareNotes® are the copyrighted property of A.D.A.M., Inc. or [employer]  The above information is an educational aid only. It is not intended as medical advice for individual conditions or treatments. Talk to your doctor, nurse or pharmacist before following any medical regimen to see if it is safe and effective for you.

## 2021-01-21 ENCOUNTER — Other Ambulatory Visit: Payer: Self-pay | Admitting: Urology

## 2021-01-21 NOTE — Telephone Encounter (Signed)
Patient needs to be seen in follow up before additional refills are provided.

## 2021-02-03 NOTE — Telephone Encounter (Signed)
Mailed letter.     Dear Veronica Bishop ,    This message is to notify you that we've received a medication refill request from you to refill your prescription. Unfortunately, you do not have a follow up scheduled with [place] and it has been a while since you were last seen. Because of this, we do need you to attend an office visit in order to receive future medication refills.    If you do not wish to be seen a our office, you may follow up with your primary care doctor for future refills.    If you wish to schedule with our office, please respond via mychart or call our office at [phone].    Thank you,    [facility]

## 2021-02-04 NOTE — Telephone Encounter (Signed)
Left vm to call back

## 2021-03-04 ENCOUNTER — Ambulatory Visit: Payer: No Typology Code available for payment source | Admitting: Primary Care

## 2021-03-04 VITALS — BP 130/72 | HR 82 | Wt 201.0 lb

## 2021-03-04 DIAGNOSIS — N3941 Urge incontinence: Secondary | ICD-10-CM

## 2021-03-04 DIAGNOSIS — M25562 Pain in left knee: Secondary | ICD-10-CM

## 2021-03-04 DIAGNOSIS — N390 Urinary tract infection, site not specified: Secondary | ICD-10-CM

## 2021-03-04 DIAGNOSIS — G8929 Other chronic pain: Secondary | ICD-10-CM

## 2021-03-04 MED ORDER — TOLTERODINE TARTRATE 4 MG PO CP24 *I*
4.0000 mg | ORAL_CAPSULE | Freq: Every day | ORAL | 0 refills | Status: DC
Start: 2021-03-04 — End: 2021-06-12

## 2021-03-04 NOTE — Progress Notes (Signed)
Dessirae Scarola Norris is a 65 y.o. year who has these acute problems:      She needs a refill of detrol for her interstitial cystitis.  She tried PT for treatment.  She often needs to go to the lab to drop off urine.    She was kneeling and hurt her knee.  She didn't take the prednisone she was prescribed.  She still has tenderness and numbness.    She has frequent urinary tract infections and goes to lab to drop off urines when she has symptoms.    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, right H33.311    Vitreous hemorrhage H43.10    PVD (posterior vitreous detachment), right H43.811       Patients meds and allergies are reviewed today and there are no changes in family history.  See electronic record for details.      BP 130/72    Pulse 82    Wt 91.2 kg (201 lb)    SpO2 97%    BMI 37.98 kg/m  Body mass index is 37.98 kg/m.    GENERAL APPEARANCE: appears stated age, well appearing, no apparent distress.  EYES: conjunctiva benign,   MOUTH: wet.  EXTREMITIES: Without clubbing, cyanosis, or edema  NEUROLOGIC: Alert and oriented times three, cranial nerves II through XII intact by observation, motor/sensory exam appear normal, normal gait.  SKIN:  no rash  PSYCH: cheerful, animated, well groomed, good eye contact.      DISCUSSION 65 y.o. year old delightful female with     URGE INCONTINENCE  Refill detrol    RECURRENT UTI  Urinalysis and urine  culture standing orders placed    CHRONIC KNEE PAIN  Nuns knee.  To ortho for treatment    Follow up in three months and as needed

## 2021-04-30 ENCOUNTER — Ambulatory Visit: Payer: No Typology Code available for payment source | Admitting: Orthopedic Surgery

## 2021-04-30 DIAGNOSIS — M25562 Pain in left knee: Secondary | ICD-10-CM

## 2021-05-07 ENCOUNTER — Ambulatory Visit: Payer: No Typology Code available for payment source | Admitting: Primary Care

## 2021-05-07 ENCOUNTER — Telehealth: Payer: Self-pay | Admitting: Primary Care

## 2021-05-07 ENCOUNTER — Other Ambulatory Visit: Payer: Self-pay | Admitting: Primary Care

## 2021-05-07 MED ORDER — PAXLOVID (300/100) 20 X 150 MG & 10 X 100MG PO TBPK
ORAL_TABLET | ORAL | 0 refills | Status: DC
Start: 2021-05-07 — End: 2021-08-29

## 2021-05-07 NOTE — Telephone Encounter (Signed)
Cancel this patient's appt, thanks.

## 2021-05-07 NOTE — Telephone Encounter (Signed)
Covid Positive Patient:    Patient calling to report that they have tested positive  for Covid19.    Date of Symptom Onset: Patient tested positive for COVID 9/14  Schedule for a zoom on 9/14@ 4:10pm.     Are you interested in antiviral therapy? Yes   (only if sxs started less than 5 days ago)    Patient has the following sxs:    Fever?: yes   Cough? Yes   Congestion/Runny Nose?  Yes   SOB or difficulty breathing? No   Body Aches? Yes   Sore throat? Yes   Nausea, vomiting, diarrhea? No   Severe headache? yes  Severe fatigue? No   Loss of taste or smell?   Duration of symptoms? No   Are you vaccinated? Yes   Do you need a note to return to school or work? N/a     Has Probation officer prepared the school/work note for the patient? N/a     Do you want the patient to be seen in the office or go for further testing? Please advise.

## 2021-06-11 ENCOUNTER — Other Ambulatory Visit: Payer: Self-pay

## 2021-06-11 ENCOUNTER — Encounter: Payer: Self-pay | Admitting: Orthopedic Surgery

## 2021-06-11 ENCOUNTER — Ambulatory Visit: Payer: No Typology Code available for payment source | Admitting: Orthopedic Surgery

## 2021-06-11 ENCOUNTER — Ambulatory Visit
Admission: RE | Admit: 2021-06-11 | Discharge: 2021-06-11 | Disposition: A | Discharge status: Home / Self Care | Payer: No Typology Code available for payment source | Source: Ambulatory Visit

## 2021-06-11 VITALS — BP 134/70 | HR 76 | Ht 61.0 in | Wt 201.0 lb

## 2021-06-11 DIAGNOSIS — M25562 Pain in left knee: Secondary | ICD-10-CM | POA: Insufficient documentation

## 2021-06-11 DIAGNOSIS — M170 Bilateral primary osteoarthritis of knee: Secondary | ICD-10-CM | POA: Insufficient documentation

## 2021-06-11 MED ORDER — GABAPENTIN 300 MG PO CAPSULE *I*
300.0000 mg | ORAL_CAPSULE | Freq: Every evening | ORAL | 0 refills | Status: AC
Start: 2021-06-11 — End: 2021-06-25

## 2021-06-11 NOTE — Progress Notes (Signed)
This is a confidential patient report written  for the express purpose of professional communication.    Subjective: Veronica Bishop acute pain spring while kneeling (giving bath to granddaughter), also with paresthesias, now improved except ongoing pain and "pins & needles" in area of contact    ROS: No chest pain, SOB, fever, chills, skin erythema, all remaining systems negative.    Objective:  Constitutional: NAD, comfortable  Psychiatric: Normal affect  Musculoskeletal:  Left knee:Ambulating heel - toe with a non-antalgic gait, soft tissue envelope intact, no appreciable effusion, ROM 0-130, minimal crepitus, no coronal or sagittal plane instability, neurocirculatroy exam within normal limits. No neuroma palpated.     Radiographs: 4 views left knee (AP.PA.Lateral.Sunrise) were reviewed by myself and demonstrate mild knee arthrosis     Impression:  1.  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  2.  Left knee resolving pre patella   3.  Left knee patellofemoral arthrosis (mild)    Plan:  Knee radiographs reviewed, discussed potential pathoetiolgies of her knee pain, discussed pins & needles as probable neuropathic pain, will trial low dose gabapentin. Discussed consideration of intra articular injection with ongoing complaints.

## 2021-06-12 ENCOUNTER — Telehealth: Payer: Self-pay | Admitting: Orthopedic Surgery

## 2021-06-12 ENCOUNTER — Other Ambulatory Visit: Payer: Self-pay | Admitting: Primary Care

## 2021-06-12 NOTE — Telephone Encounter (Signed)
Pharmacy is calling question the instruction of the gabapentin 300mg  does not match the quantity. Please send in a new prescription

## 2021-06-12 NOTE — Telephone Encounter (Signed)
Additional message:    Last office visit :   03/04/2021  Patients upcoming appointments:  Future Appointments   Date Time Provider [place]   07/04/2021  8:30 AM Diloreto, Shanon Brow, MD [facility] None     Recent Lab results:  GENERAL CHEMISTRY   Recent Labs     07/01/20  1104   NA 142   K 4.1   CL 105   CO2 28   GAP 9   UN 8   CREAT 0.71   GFRC 91   GFRB 105   GLU 116*   CA 9.9      LIPID PROFILE   Recent Labs     07/01/20  1104   CHOL 177   TRIG 135   HDL 50   LDLC 100      LIVER PROFILE   No value within the past 365 days   DIABETES THYROID   Recent Labs     07/01/20  1104   HA1C 5.6    No value within the past 365 days      Pending/Orders Labs:  Lab Frequency Next Occurrence   Aerobic culture Monthly + PRN 06/16/2021, 06/23/2021, 06/30/2021, 07/07/2021, 07/14/2021, 07/21/2021, 07/28/2021, 08/04/2021, 08/11/2021, 08/18/2021, 08/25/2021, 09/01/2021   Urinalysis with reflex to microscopic Monthly + PRN 06/16/2021, 06/23/2021, 06/30/2021, 07/07/2021, 07/14/2021, 07/21/2021, 07/28/2021, 08/04/2021, 08/11/2021, 08/18/2021, 08/25/2021, 09/01/2021

## 2021-07-03 ENCOUNTER — Telehealth: Payer: Self-pay | Admitting: Retina Ophthalmology

## 2021-07-03 NOTE — Telephone Encounter (Signed)
Patient called in, reporting symptom free for covid and also tested negative yesterday morning because exposed at a funeral a few days ago. Let her know okay to come in for her appoinntment, but if any symptoms develop by tomorrow morning, needs to call in and reschedule appointment.

## 2021-07-03 NOTE — Telephone Encounter (Signed)
Patient states that she attended a funeral earlier this week and several people who were there have tested positive for Covid. She has no symptoms and is asking if she can keep her appointment tomorrow at 8:30a. Please advise patient

## 2021-07-04 ENCOUNTER — Ambulatory Visit: Payer: No Typology Code available for payment source | Attending: Retina Ophthalmology | Admitting: Retina Ophthalmology

## 2021-07-04 ENCOUNTER — Other Ambulatory Visit: Payer: Self-pay

## 2021-07-04 DIAGNOSIS — H33311 Horseshoe tear of retina without detachment, right eye: Secondary | ICD-10-CM | POA: Insufficient documentation

## 2021-07-04 DIAGNOSIS — H35352 Cystoid macular degeneration, left eye: Secondary | ICD-10-CM | POA: Insufficient documentation

## 2021-07-04 NOTE — Assessment & Plan Note (Signed)
PVD with VH and retinal tears  --s/p barrier LPC OD (05/20/14)  --s/p barrier LPC OD (05/21/14)  --s/p barrier LPC OD (05/24/14)   --doing great, no new heme or tears  --f/u 1 yr OCT OU, dilated OU  --SSX of RD explained and understood    Chronic pseudophakic ME OS  HX OF RD repair X 3 (3/11), now resolved    Hx RD repair OS  RD repair X 3 (3/11)  See above    PCIOL OS  -PCO OS, follow for now

## 2021-07-04 NOTE — Progress Notes (Signed)
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 States no ocular changes noted X last visit.    No DM                 Last edited by Johnell Comings, MD on 07/04/2021  9:08 AM.          Current Outpatient Medications:     tolterodine (DETROL LA) 4 mg 24 hr capsule, TAKE 1 CAPSULE (4 MG TOTAL) BY MOUTH DAILY SWALLOW WHOLE. DO NOT CRUSH OR CHEW., Disp: 90 capsule, Rfl: 0    EPINEPHrine (EPIPEN) 0.3 mg/0.3 mL auto-injector, INJECT 0.3ML INTRAMUSCULARLY AS DIRECTED, Disp: 2 each, Rfl: 1    naproxen sodium (ANAPROX) 220 MG tablet, Take 220 mg by mouth as needed   , Disp: , Rfl:     aspirin 81 MG tablet, Take 81 mg by mouth daily, Disp: , Rfl:     nirmatrelvir & ritonavir (PAXLOVID) 150 & 100 mg tablet therapy pack, for Infection caused by COVID-19 Coronavirus Twice daily Take 2 Nirmatrelvir tablets and 1 Ritonavir tablet at the same time by mouth., Disp: 30 tablet, Rfl: 0  Sulfa antibiotics and No known latex allergy   No birth history on file.  Past Medical History:   Diagnosis Date    Cataract     Chronic kidney disease     IC (interstitial cystitis)     Macular edema     PERSISITENT PSEUDOPHAKIC OS    PVD (posterior vitreous detachment), right 05/21/2014    Refractive error 03/27/2011    Varicella     Vitreous hemorrhage 05/21/2014      Past Surgical History:   Procedure Laterality Date    25G PPV/IVT/AFX/EL/22%SF6 OS  05/16/09    Avastin #1 OS 08/14/11 consent 08/14/11 maxitrol      barrier laser, right eye  05/20/14    Dr Denyce Robert    BARRIER LPC OS   07/30/09    BUNIONECTOMY      Hallux Valgus (Bunion) Correction Conversion Data     CATARACT REMOVAL  10/24/09    PC IOL OS    CESAREAN SECTION, CLASSIC      Cesarean Section Conversion Data     HX TONSILLECTOMY/ADENOIDECTOMY      Tonsillectomy Conversion Data     INTRA VITREAL TRIESENCE OS  05/30/10    4MG     PHACO/IOL/25G PPV/PCOX/MP/AFX/ICG/ILMX/14% C3 F8 OS  10/24/09    RETINAL DETACHMENT SURGERY      X 3    SB/20G PPV/MO/PFC/RTX/AFX/15% C3 F8 OS  06/20/09      Social History     Tobacco Use   Smoking Status Former    Types: Cigarettes    Quit date: 02/15/1981    Years since quitting: 40.4   Smokeless Tobacco Never  Social History     Substance and Sexual Activity   Alcohol Use Yes      Social History     Substance and Sexual Activity   Drug Use No      Specialty Problems        Ophthalmology Problems    Cataract        Cystoid macular edema of left eye        Refractive error        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       Objective:   Objective There were no vitals filed for this visit.    Base Eye Exam     Visual Acuity (Snellen - Linear)       Right Left    Dist cc 20/30 CF@3ft (ecc)    Correction: Glasses          Tonometry (Tonopen, 8:41 AM)       Right Left    Pressure 20 16          Pupils       Shape APD    Right Round None    Left Round None          Neuro/Psych     Oriented x3: Yes    Mood/Affect: Normal            Slit Lamp and Fundus Exam     External Exam       Right Left    External Normal Normal          Slit Lamp Exam       Right Left    Lids/Lashes Normal Normal    Conjunctiva/Sclera White and quiet White and quiet     Cornea Clear Clear    Anterior Chamber Deep and quiet Deep and quiet    Iris Round and reactive Round and reactive    Lens 1+ Nuclear sclerosis Posterior chamber intraocular lens; 3+ PCO    Vitreous Posterior vitreous detachment; mild VH clear          Fundus Exam       Right Left    Disc Normal Normal    C/D Ratio 0.3 0.5    Macula Normal SR band, resolved ME    Vessels Normal attenuated    Periphery Multiple tears scattered 360 all lasered with no new heme inf 180 Retx; attached, Laser scar, Macula attached, laser                                   Assessment/Plan:   Assessment 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

## 2021-08-21 ENCOUNTER — Encounter: Payer: Self-pay | Admitting: Primary Care

## 2021-08-27 ENCOUNTER — Encounter: Payer: Self-pay | Admitting: Primary Care

## 2021-08-28 ENCOUNTER — Other Ambulatory Visit: Payer: Self-pay | Admitting: Primary Care

## 2021-08-28 DIAGNOSIS — Z136 Encounter for screening for cardiovascular disorders: Secondary | ICD-10-CM

## 2021-08-28 DIAGNOSIS — K7581 Nonalcoholic steatohepatitis (NASH): Secondary | ICD-10-CM

## 2021-08-29 ENCOUNTER — Encounter: Payer: Self-pay | Admitting: Medical

## 2021-08-29 ENCOUNTER — Other Ambulatory Visit: Payer: Self-pay

## 2021-08-29 ENCOUNTER — Other Ambulatory Visit
Admission: RE | Admit: 2021-08-29 | Discharge: 2021-08-29 | Disposition: A | Discharge status: Home / Self Care | Payer: No Typology Code available for payment source | Source: Ambulatory Visit | Attending: Primary Care | Admitting: Primary Care

## 2021-08-29 ENCOUNTER — Ambulatory Visit: Payer: No Typology Code available for payment source | Attending: Medical | Admitting: Medical

## 2021-08-29 VITALS — BP 124/86 | HR 83 | Temp 98.8°F | Wt 199.0 lb

## 2021-08-29 DIAGNOSIS — Z01419 Encounter for gynecological examination (general) (routine) without abnormal findings: Secondary | ICD-10-CM | POA: Insufficient documentation

## 2021-08-29 DIAGNOSIS — Z136 Encounter for screening for cardiovascular disorders: Secondary | ICD-10-CM | POA: Insufficient documentation

## 2021-08-29 DIAGNOSIS — Z1231 Encounter for screening mammogram for malignant neoplasm of breast: Secondary | ICD-10-CM | POA: Insufficient documentation

## 2021-08-29 DIAGNOSIS — K7581 Nonalcoholic steatohepatitis (NASH): Secondary | ICD-10-CM | POA: Insufficient documentation

## 2021-08-29 DIAGNOSIS — N390 Urinary tract infection, site not specified: Secondary | ICD-10-CM | POA: Insufficient documentation

## 2021-08-29 DIAGNOSIS — Z124 Encounter for screening for malignant neoplasm of cervix: Secondary | ICD-10-CM | POA: Insufficient documentation

## 2021-08-29 LAB — URINE MICROSCOPIC (IQ200)

## 2021-08-29 LAB — LIPID PANEL
Chol/HDL Ratio: 4.8
Cholesterol: 264 mg/dL — AB
HDL: 55 mg/dL (ref 40–60)
LDL Calculated: 172 mg/dL — AB
Non HDL Cholesterol: 209 mg/dL
Triglycerides: 186 mg/dL — AB

## 2021-08-29 LAB — HEPATIC FUNCTION PANEL
ALT: 30 U/L (ref 0–35)
AST: 26 U/L (ref 0–35)
Albumin: 4.5 g/dL (ref 3.5–5.2)
Alk Phos: 94 U/L (ref 35–105)
Bilirubin,Direct: <0.2 mg/dL (ref 0.0–0.3)
Bilirubin,Total: 0.5 mg/dL (ref 0.0–1.2)
Total Protein: 6.9 g/dL (ref 6.3–7.7)

## 2021-08-29 LAB — URINALYSIS WITH REFLEX TO MICROSCOPIC
Blood,UA: NEGATIVE
Glucose,UA: NEGATIVE
Nitrite,UA: POSITIVE — AB
Specific Gravity,UA: 1.028 (ref 1.002–1.030)
pH,UA: 5.5 (ref 5.0–8.0)

## 2021-08-29 LAB — BASIC METABOLIC PANEL
Anion Gap: 10 (ref 7–16)
CO2: 28 mmol/L (ref 20–28)
Calcium: 10.1 mg/dL (ref 8.6–10.2)
Chloride: 102 mmol/L (ref 96–108)
Creatinine: 0.74 mg/dL (ref 0.51–0.95)
Glucose: 91 mg/dL (ref 60–99)
Lab: 13 mg/dL (ref 6–20)
Potassium: 4.6 mmol/L (ref 3.3–5.1)
Sodium: 140 mmol/L (ref 133–145)
eGFR BY CREAT: 90 *

## 2021-08-29 LAB — HEMOGLOBIN A1C: Hemoglobin A1C: 5.7 % — ABNORMAL HIGH

## 2021-08-29 NOTE — Progress Notes (Signed)
[facility] [place]: Annual Gyn Exam    HPI  Veronica Bishop is a 66 y.o. postmenopausal female who presents for an annual exam.  She is feeling well and offers no acute complaints today.    OB/Gyn History  LMP: No LMP recorded. (Menstrual status: Other medications).      Last pap smear: Date: 07/06/2018 NILM HPV (-)  Results: no abnormalities  History of abnormal pap smear prior to last pap smear: No.   Previous abnormality: no abnormalities    The patient is sexually active.   Sexually active: single partner, contraception - post menopausal status    The patient has never been taking hormone replacement therapy. Patient denies post-menopausal vaginal bleeding.    OB History   No obstetric history on file.       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, right H33.311    Vitreous hemorrhage H43.10    PVD (posterior vitreous detachment), right H43.811      Past Medical History:   Diagnosis Date    Cataract     Chronic kidney disease     IC (interstitial cystitis)     Macular edema     PERSISITENT PSEUDOPHAKIC OS    PVD (posterior vitreous detachment), right 05/21/2014    Refractive error 03/27/2011    Varicella     Vitreous hemorrhage 05/21/2014      Past Surgical History:   Procedure Laterality Date    25G PPV/IVT/AFX/EL/22%SF6 OS  05/16/09    Avastin #1 OS 08/14/11 consent 08/14/11 maxitrol      barrier laser, right eye   05/20/14    Dr Denyce Robert    BARRIER LPC OS  07/30/09    BUNIONECTOMY      Hallux Valgus (Bunion) Correction Conversion Data     CATARACT REMOVAL  10/24/09    PC IOL OS    CESAREAN SECTION, CLASSIC      Cesarean Section Conversion Data     HX TONSILLECTOMY/ADENOIDECTOMY      Tonsillectomy Conversion Data     INTRA VITREAL TRIESENCE OS  05/30/10    4MG     PHACO/IOL/25G PPV/PCOX/MP/AFX/ICG/ILMX/14% C3 F8 OS  10/24/09    RETINAL DETACHMENT SURGERY      X 3    SB/20G PPV/MO/PFC/RTX/AFX/15% C3 F8 OS  06/20/09     Current Outpatient Medications   Medication    tolterodine (DETROL LA) 4 mg 24 hr capsule    EPINEPHrine (EPIPEN) 0.3 mg/0.3 mL auto-injector    naproxen sodium (ANAPROX) 220 MG tablet    aspirin 81 MG tablet     No current facility-administered medications for this visit.     Allergies   Allergen Reactions    Sulfa Antibiotics      Created by Conversion - 0;  No Known Latex Allergy      Family History   Problem Relation Age of Onset    Conversion Other         [id number]^JSHFWYOV Mellitus^250.00^Active^    Conversion Other         [id number]^Active^    Conversion Other         [id number]^DXAJOINOMVEH^209.9^Active^    Glaucoma Mother     Stroke Mother     Glaucoma Father     Cataracts Father     Retinal detachment Father     Diabetes Father     Heart Disease Father     Stroke Father      Social History     Socioeconomic History    Marital status: Married   Tobacco Use    Smoking status: Former     Types: Cigarettes     Quit date: 02/15/1981     Years since quitting: 40.5    Smokeless tobacco: Never   Substance and Sexual Activity    Alcohol use: Yes    Drug use: No    Sexual activity: Yes     Partners: Male       Health Maintenance   Screens:    Colorectal Cancer Screen:  Due for repeat scope; encouraged patient to contact GI    Last colonoscopy 07/27/2016 revealed diverticulosis and 1 polyp removed; tubular adenoma   Cervical Cancer Screen (F21-65): Encouraged  patient to call Melina Modena   Last Pap smear 07/06/2018 NILM HPV (-)   Breast Cancer Screen (F40-75): ordered    Last mammogram 04/17/2020 BI-RADS Category 1: Negative   Osteoporosis screen: (O70+J62+): not indicated   Lung Cancer Screen(50-80;20 py): not indicated   AAA screening (M65-75): not indicated   Prostate Cancer (M55-69): not indicated   Hepatitis C (18-79): Negative screening 2015   HIV (15-65): declined   GC/Chlamydia (F<24): not indicated   Immunizations:   Flu: Received 07/24/2021   Covid: Received 3 doses of Moderna, due for booster   Tdap/Td: Last Tdap 09/10/2010   HPV series (F<26, M<21): not indicated   Pneumovax (65+, < 65 c disease): not indicated   Prevnar (65+): not indicated   Shingrix (50+): Received first dose 07/24/2021   HepB series (DM <60): not indicated      ROS  CONSTITUTIONAL: Appetite good, no fevers, night sweats or weight loss   CV: No chest pain, palpitations, shortness of breath or peripheral edema   RESPIRATORY: No cough, wheezing or dyspnea   BREAST: No nipple discharge or breast tenderness  GI: No nausea/vomiting, abdominal pain, or change in bowel habits   GU: No dysuria, urgency or incontinence  GYN: No vaginal discharge, odor, itching, or discomfort  NEURO: No MS changes, no motor weakness, no sensory changes       PHYSICAL EXAM  BP 124/86    Pulse 83    Temp 37.1 C (98.8 F)    Wt 90.3 kg (199 lb)    SpO2 100%    BMI 37.60 kg/m   General: appears well, NAD, normal mood and affect; answers questions appropriately  Lungs: clear to auscultation bilaterally with no rales, wheezes, or rhonchi  CV: RRR, no murmur, rub or gallop  Breast exam offered; politely declined by patient due to upcoming physical with PCP.  Pelvic: Normal external genitalia without skin changes or lesions; no labial swelling or organ prolapse. No inguinal LAD. Vaginal mucosa is pink and moist without dryness, lesions, or ulceration. On speculum exam the  vaginal walls and cervix appear  normal, minimal amount of normal-appearing vaginal discharge, no lesions; no cervical motion tenderness; Adnexa are without masses, enlargement or tenderness to palpation; Uterus is midline, smooth with no enlargement and no tenderness to palpation.  Lower Extremities: no edema       ASSESSMENT and PLAN:    Well 66 y.o. year old   1. Well woman exam with routine gynecological exam  Mammography screening BILATERAL    GYN Cytology      2. Encounter for Papanicolaou smear of cervix  GYN Cytology      3. Encounter for screening mammogram for malignant neoplasm of breast  Mammography screening BILATERAL         1. Well woman exam with routine gynecological exam  Exam WNL patient is feeling well with no acute concerns.  Co-testing Pap + HPV completed today; no history of abnormal paps  Mammogram ordered  - Mammography screening BILATERAL; Future  - GYN Cytology; Future      Immunizations:   Immunization History   Administered Date(s) Administered    Covid-19 mRNA vaccine (MODERNA) IM 100 mcg/0.5 mL 11/11/2019, 12/09/2019, 07/22/2020    Influenza Cell-Based prefilled syringe (Flucelvax) 31mo and up 07/24/2021    Influenza Inj Quad Historical(aka FLU,unspecified) 08/04/2021    Influenza Quadrivalent 0.41mL prefilled syringe/single dose vial(FluLaval,Fluzone,Afluria,Fluarix) 07/06/2018, 07/05/2020    Influenza multi-dose vial 08/21/2013    Tdap 09/10/2010    Zoster(Shingrix) 07/24/2021    Zoster(Zostavax) 10/22/2013         Follow-up: As previously scheduled with PCP 09/02/2021  Future Appointments   Date Time Provider [place]   09/02/2021 10:30 AM Deiss, Dani Gobble, MD [facility] None   07/10/2022  8:30 AM Diloreto, Shanon Brow, MD [facility] None         Romona Curls, PA-C  Family Medicine  08/29/21  10:10 AM    This note has been dictated using Editor, commissioning.  Reasonable attempts to correct typing mistakes have been done.  Please excuse any inherent inaccuracy that might have escaped.

## 2021-08-29 NOTE — Addendum Note (Signed)
Addended by: Jory Ee on: 08/29/2021 10:24 AM     Modules accepted: Orders

## 2021-08-31 LAB — AEROBIC CULTURE

## 2021-09-01 ENCOUNTER — Other Ambulatory Visit: Payer: Self-pay | Admitting: Primary Care

## 2021-09-01 ENCOUNTER — Telehealth: Payer: Self-pay | Admitting: Primary Care

## 2021-09-01 MED ORDER — NITROFURANTOIN MONOHYD MACRO 100 MG PO CAPS *I*
100.0000 mg | ORAL_CAPSULE | Freq: Two times a day (BID) | ORAL | 0 refills | Status: DC
Start: 2021-09-01 — End: 2021-11-28
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
## 2021-09-01 NOTE — Telephone Encounter (Signed)
Antibiotic sent in

## 2021-09-01 NOTE — Telephone Encounter (Signed)
-----   Message from Joaquin Music, MD sent at 09/01/2021 12:15 PM EST -----  See if Veronica Bishop is on an antibiotic

## 2021-09-01 NOTE — Telephone Encounter (Signed)
Patient stated that she IS NOT on an antibiotic

## 2021-09-02 ENCOUNTER — Other Ambulatory Visit: Payer: Self-pay | Admitting: Primary Care

## 2021-09-02 ENCOUNTER — Other Ambulatory Visit: Payer: Self-pay

## 2021-09-02 ENCOUNTER — Encounter: Payer: Self-pay | Admitting: Primary Care

## 2021-09-02 ENCOUNTER — Ambulatory Visit: Payer: No Typology Code available for payment source | Admitting: Primary Care

## 2021-09-02 VITALS — BP 138/84 | HR 77 | Ht 62.0 in | Wt 198.0 lb

## 2021-09-02 DIAGNOSIS — E785 Hyperlipidemia, unspecified: Secondary | ICD-10-CM

## 2021-09-02 MED ORDER — PANTOPRAZOLE SODIUM 40 MG PO TBEC *I*
40.0000 mg | DELAYED_RELEASE_TABLET | Freq: Every day | ORAL | 1 refills | Status: AC
Start: 2021-09-02 — End: 2022-03-01

## 2021-09-02 MED ORDER — EPINEPHRINE 0.3 MG/0.3ML IJ SOAJ *I*
INTRAMUSCULAR | 1 refills | Status: AC
Start: 2021-09-02 — End: ?

## 2021-09-02 NOTE — Telephone Encounter (Signed)
Patient is advised.  

## 2021-09-05 ENCOUNTER — Other Ambulatory Visit: Payer: Self-pay | Admitting: Primary Care

## 2021-09-05 ENCOUNTER — Encounter: Payer: No Typology Code available for payment source | Admitting: Primary Care

## 2021-09-05 NOTE — Telephone Encounter (Signed)
Last office visit with doctor:   09/02/2021  Last office visit with APP:   08/29/2021  Patients upcoming appointments:  Future Appointments   Date Time Provider [place]   07/10/2022  8:30 AM Diloreto, Shanon Brow, MD [facility] None     Recent Lab results:  GENERAL CHEMISTRY   Recent Labs     08/29/21  1020   NA 140   K 4.6   CL 102   CO2 28   GAP 10   UN 13   CREAT 0.74   GLU 91   CA 10.1      LIPID PROFILE   Recent Labs     08/29/21  1020   CHOL 264*   TRIG 186*   HDL 55   LDLC 172*      LIVER PROFILE   Recent Labs     08/29/21  1020   ALT 30   AST 26   ALK 94   TB 0.5      DIABETES THYROID   Recent Labs     08/29/21  1020   HA1C 5.7*    No value within the past 365 days      Pending/Orders Labs:  Lab Frequency Next Occurrence   Mammography screening BILATERAL Once 08/30/2021   Lipid Panel (Reflex to Direct  LDL if Triglycerides more than 400) Once 10/03/2021   Urinalysis with reflex to microscopic Monthly + PRN         .pcn

## 2021-09-10 LAB — GYN CYTOLOGY

## 2021-09-11 ENCOUNTER — Encounter: Payer: Self-pay | Admitting: Medical

## 2021-09-17 ENCOUNTER — Telehealth: Payer: Self-pay | Admitting: Primary Care

## 2021-09-17 ENCOUNTER — Other Ambulatory Visit: Payer: Self-pay | Admitting: Primary Care

## 2021-09-17 MED ORDER — CEPHALEXIN 500 MG PO CAPS *I*
500.0000 mg | ORAL_CAPSULE | Freq: Two times a day (BID) | ORAL | 0 refills | Status: AC
Start: 2021-09-17 — End: 2021-09-22

## 2021-09-17 NOTE — Telephone Encounter (Signed)
Patient called verified DOB.  Having burning sensation , urgency .   Patient states had a standing order for urin was rx medication for infection .   Once patient finished medication her symptoms gradually came back  And became worse .  Patient is wondering if pcp can send in another script. Patient is out of town .  Patient does not want to go to urgent as they do not have a copy of her recent urin test.  Patient request response be sent to her my chart . She is not able to use phone.

## 2021-09-17 NOTE — Telephone Encounter (Signed)
Pharmacy

## 2021-09-18 NOTE — Telephone Encounter (Signed)
She stated to send it to the pharmacy here and her husband will sent it to her

## 2021-09-19 NOTE — Telephone Encounter (Signed)
Medication sent to pharmacy  

## 2021-09-26 ENCOUNTER — Other Ambulatory Visit: Payer: Self-pay | Admitting: Primary Care

## 2021-09-26 ENCOUNTER — Encounter: Payer: Self-pay | Admitting: Primary Care

## 2021-09-26 ENCOUNTER — Other Ambulatory Visit
Admission: RE | Admit: 2021-09-26 | Discharge: 2021-09-26 | Disposition: A | Discharge status: Home / Self Care | Payer: No Typology Code available for payment source | Source: Ambulatory Visit | Attending: Primary Care | Admitting: Primary Care

## 2021-09-26 DIAGNOSIS — N390 Urinary tract infection, site not specified: Secondary | ICD-10-CM

## 2021-09-26 LAB — URINALYSIS WITH REFLEX TO MICROSCOPIC
Blood,UA: NEGATIVE
Glucose,UA: NEGATIVE
Ketones, UA: NEGATIVE
Nitrite,UA: NEGATIVE
Protein,UA: NEGATIVE
Specific Gravity,UA: 1.005 (ref 1.002–1.030)
pH,UA: 6.5 (ref 5.0–8.0)

## 2021-09-26 LAB — URINE MICROSCOPIC (IQ200)

## 2021-09-30 ENCOUNTER — Telehealth: Payer: Self-pay | Admitting: Primary Care

## 2021-09-30 ENCOUNTER — Other Ambulatory Visit: Payer: Self-pay | Admitting: Primary Care

## 2021-09-30 MED ORDER — CEPHALEXIN 500 MG PO CAPS *I*
500.0000 mg | ORAL_CAPSULE | Freq: Four times a day (QID) | ORAL | 0 refills | Status: AC
Start: 2021-09-30 — End: 2021-10-07

## 2021-09-30 NOTE — Telephone Encounter (Signed)
Tried to call the phone #c provided says that # not avail. Will send my chart message.

## 2021-09-30 NOTE — Telephone Encounter (Signed)
Patient's spouse called as patient is out of state for business.  She is in [place].    On 09/26/21 she did urine and is still having bad symptoms.  She is having increased pelvic pain at times and suffers from ICS.    Please advise on any possible relief of symptoms.    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

## 2021-09-30 NOTE — Telephone Encounter (Signed)
Antibiotic sent in

## 2021-10-15 ENCOUNTER — Encounter: Payer: Self-pay | Admitting: Primary Care

## 2021-10-24 ENCOUNTER — Other Ambulatory Visit: Payer: Self-pay | Admitting: Primary Care

## 2021-10-24 MED ORDER — PANTOPRAZOLE SODIUM 20 MG PO TBEC *I*
20.0000 mg | DELAYED_RELEASE_TABLET | Freq: Every day | ORAL | 1 refills | Status: DC
Start: 2021-10-24 — End: 2021-12-15

## 2021-10-24 NOTE — Telephone Encounter (Signed)
Last office visit with doctor:   09/02/2021  Last office visit with APP:   08/29/2021  Patients upcoming appointments:  Future Appointments   Date Time Provider [place]   07/10/2022  8:30 AM Diloreto, Shanon Brow, MD [facility] None     Recent Lab results:  GENERAL CHEMISTRY   Recent Labs     08/29/21  1020   NA 140   K 4.6   CL 102   CO2 28   GAP 10   UN 13   CREAT 0.74   GLU 91   CA 10.1      LIPID PROFILE   Recent Labs     08/29/21  1020   CHOL 264*   TRIG 186*   HDL 55   LDLC 172*      LIVER PROFILE   Recent Labs     08/29/21  1020   ALT 30   AST 26   ALK 94   TB 0.5      DIABETES THYROID   Recent Labs     08/29/21  1020   HA1C 5.7*    No value within the past 365 days      Pending/Orders Labs:  Lab Frequency Next Occurrence   Mammography screening BILATERAL Once 08/30/2021   Lipid Panel (Reflex to Direct  LDL if Triglycerides more than 400) Once 10/03/2021   Aerobic culture Once 09/26/2021   Urinalysis with reflex to microscopic Monthly + PRN         .pcn

## 2021-10-24 NOTE — Telephone Encounter (Signed)
Patient called wanted to know if she could get a call back due due her message she sent on 2/22. Patient state that she send a message on 2/22 due to a UTI and wanted to be seen before she went out of town for work. Patient is now in [place] and still has the UTI. Patient wanted to know if she a nurse or Dr. Josue Hector could do a zoom visit with her so she could get the UTI under control

## 2021-10-27 NOTE — Telephone Encounter (Signed)
Patient called office and states that she wants to air her frustration.    She states she sent a MyChart message on 10/15/21 thinking that would gain her a quicker response in regards to her request.    She states she then called 10/24/21 because she had not heard back.    She goes back and forth from [place] to [place] for work.  She states that husband ships her prescriptions and it cost $75 to ship them.    Read through all correspondence and asked patient what we could do for her right now.    She states that she wants an apology - which I gave her.    We then discussed contacting the office with any issues as MyChart is not working the way she thought it would.    She states that she will get her lab done on 11/06/21 when she arrives back home.    I believe that she is all set for now.

## 2021-10-30 ENCOUNTER — Encounter: Payer: Self-pay | Admitting: Primary Care

## 2021-11-10 ENCOUNTER — Other Ambulatory Visit
Admission: RE | Admit: 2021-11-10 | Discharge: 2021-11-10 | Disposition: A | Discharge status: Home / Self Care | Payer: No Typology Code available for payment source | Source: Ambulatory Visit | Attending: Primary Care | Admitting: Primary Care

## 2021-11-10 DIAGNOSIS — E785 Hyperlipidemia, unspecified: Secondary | ICD-10-CM | POA: Insufficient documentation

## 2021-11-10 LAB — LIPID PANEL
Chol/HDL Ratio: 4.6
Cholesterol: 238 mg/dL — AB
HDL: 52 mg/dL (ref 40–60)
LDL Calculated: 147 mg/dL — AB
Non HDL Cholesterol: 186 mg/dL
Triglycerides: 197 mg/dL — AB

## 2021-11-13 ENCOUNTER — Encounter: Payer: Self-pay | Admitting: Primary Care

## 2021-11-28 ENCOUNTER — Encounter: Payer: Self-pay | Admitting: Medical

## 2021-11-28 ENCOUNTER — Ambulatory Visit: Payer: No Typology Code available for payment source | Attending: Medical | Admitting: Medical

## 2021-11-28 ENCOUNTER — Other Ambulatory Visit: Payer: Self-pay

## 2021-11-28 VITALS — BP 140/80 | HR 83 | Temp 97.2°F

## 2021-11-28 DIAGNOSIS — J069 Acute upper respiratory infection, unspecified: Secondary | ICD-10-CM | POA: Insufficient documentation

## 2021-11-28 DIAGNOSIS — H6692 Otitis media, unspecified, left ear: Secondary | ICD-10-CM | POA: Insufficient documentation

## 2021-11-28 DIAGNOSIS — Z20822 Contact with and (suspected) exposure to covid-19: Secondary | ICD-10-CM | POA: Insufficient documentation

## 2021-11-28 DIAGNOSIS — Z20828 Contact with and (suspected) exposure to other viral communicable diseases: Secondary | ICD-10-CM | POA: Insufficient documentation

## 2021-11-28 MED ORDER — AMOXICILLIN 500 MG PO TABS *I*
500.0000 mg | ORAL_TABLET | Freq: Two times a day (BID) | ORAL | 0 refills | Status: AC
Start: 2021-11-28 — End: 2021-12-08

## 2021-11-28 NOTE — Progress Notes (Signed)
[facility] [place]: Adult Acute Visit Progress Note - URI Symptoms     Subjective:  Veronica Bishop is a 66 y.o. female  here for an acute visit today for the following issue:    Chief Complaint   Patient presents with    Follow-up      Patient is presenting with URI symptoms since Sunday. Symptoms are overall worsening for last 6 days. She just flew home from [place].  Admits to bilateral ear pain, pressure, headache, nasal congestion, cough and congestion. Her cough is mostly dry, sometimes brings up green sputum.   Denies fever, chills/sweats, SOB, chest pain, nausea, vomiting, diarrhea or weakness.  She has been taking OTC Dayquil, Nyquil and codeine cough syrup she got from [place] in past with mild relief.  She did take a Covid test at home which was negative.     Current Immunizations     Name Date Dose VIS Date Route    Covid-19 mRNA vaccine (MODERNA) IM 100 mcg/0.5 mL 07/22/2020 -- -- Intramuscular    Covid-19 mRNA vaccine (MODERNA) IM 100 mcg/0.5 mL 12/09/2019 -- -- Intramuscular    Covid-19 mRNA vaccine (MODERNA) IM 100 mcg/0.5 mL 11/11/2019 -- -- Intramuscular           ROS: As above      The patient's medications were reviewed and reconciled with the patient.  The patient's allergies were reviewed and confirmed with the patient.  Pertinent portions of the patient's past medical, surgical, social and/or family histories were reviewed and updated.      Objective:    BP 140/80 (BP Location: Right arm, Patient Position: Sitting, Cuff Size: adult)    Pulse 83    Temp 36.2 C (97.2 F) (Temporal)    SpO2 93%      General: She is ill-appearing but in NAD, normal mood and affect; answers questions appropriately  HEENT:  Head: NCAT. Bilateral maxillary sinuses are tender to palpation.   Eyes: Lids without lesions, edema, erythema, crusting or drainage. No scleral icterus or injection, conjunctivae are pink without injection or exudate.   Ears: External ear without trauma, erythema, lesions or  swelling. Left TM is erythematous and bulging. Canals are mildly erythematous and edematous.  No auricular or mastoid tenderness.  Nose: Nares are patent with thick rhinorrhea and mild edema of inferior turbinates. Nasal mucosa is erythematous.  Mouth/Throat:  Moist mucous membranes and no lesions or exudate with no erythema of posterior pharynx. No cobblestoning.   Neck: supple, anterior cervical LAD  Lungs: clear to auscultation bilaterally with no rales, wheezes, or rhonchi  CV: RRR, no murmur, rub or gallop      Assessment and Plan:    1. Left otitis media  amoxicillin (AMOXIL) 500 MG tablet      2. URI, acute  COVID/Influenza A & B/RSV NAAT (PCR)        1. Left otitis media  Symptoms and exam are consistent with otitis media.  RX amoxicillin.  Recommend OTC analgesics with Tylenol or ibuprofen.   - amoxicillin (AMOXIL) 500 MG tablet; Take 1 tablet (500 mg total) by mouth every 12 hours for 10 days  for Middle Ear Inflammation  Dispense: 20 tablet; Refill: 0    2. URI, acute  Nasopharyngeal swab sent for Covid/Flu/RSV to rule out.  Advised patient of isolation precautions until we receive negative Covid result.  Advised patient to continue with supportive care at home including OTC decongestant/expectorant therapy, Advil or Tylenol as needed.  Advised patient to monitor for symptom worsening or development of new symptoms including chest pain, shortness of breath, fever, chills, sweats, weakness, nausea, or vomiting.  - COVID/Influenza A & B/RSV NAAT (PCR); Future      Patient is instructed to call office if symptoms worsen or do not improve with above recommended treatment.    Patient is to keep next scheduled appt as previously scheduled.   Future Appointments       Provider [place]    11/28/2021 2:00 PM Osmond Steckman, Cephus Shelling, [place] [facility] [place]     07/10/2022 8:30 AM Diloreto, Shanon Brow, MD [facility]            Romona Curls, PA-C  Family Medicine  11/28/21  2:36  PM    This note has been dictated using Dragon software.  Reasonable attempts to correct typing mistakes have been done.  Please excuse any inherent inaccuracy that might have escaped.

## 2021-11-29 LAB — COVID/INFLUENZA A & B/RSV NAAT (PCR)
COVID-19 NAAT (PCR): NEGATIVE
Influenza A NAAT (PCR): NEGATIVE
Influenza B NAAT (PCR): NEGATIVE
RSV NAAT (PCR): NEGATIVE

## 2021-12-01 ENCOUNTER — Telehealth: Payer: Self-pay | Admitting: Primary Care

## 2021-12-01 NOTE — Telephone Encounter (Signed)
Patient was seen on Friday for ear infection and prescribed amoxicillin. She hasn't gotten any better and has now developed pink eye sxs. Patient having difficulty hearing. Patient requesting to be seen again.    Please advise on handling.

## 2021-12-01 NOTE — Telephone Encounter (Signed)
Does PCP have any availability tomorrow to see patient? She could also go to an urgent care today

## 2021-12-01 NOTE — Telephone Encounter (Signed)
Patient currently scheduled for 3:50 same day slot tomorrow on pcp schedule    Advised patient is she cant hold on until tomorrow and goes to [facility] to let us know and that she can still be seen either way

## 2021-12-01 NOTE — Telephone Encounter (Signed)
PA aware, thank you

## 2021-12-02 ENCOUNTER — Encounter: Payer: Self-pay | Admitting: Primary Care

## 2021-12-02 ENCOUNTER — Ambulatory Visit: Payer: No Typology Code available for payment source | Admitting: Primary Care

## 2021-12-02 ENCOUNTER — Other Ambulatory Visit: Payer: Self-pay

## 2021-12-02 VITALS — BP 148/92 | HR 91 | Temp 97.1°F | Wt 199.6 lb

## 2021-12-02 DIAGNOSIS — J069 Acute upper respiratory infection, unspecified: Secondary | ICD-10-CM

## 2021-12-02 DIAGNOSIS — H6691 Otitis media, unspecified, right ear: Secondary | ICD-10-CM

## 2021-12-02 MED ORDER — PREDNISONE 20 MG PO TABS *I*
ORAL_TABLET | ORAL | 0 refills | Status: DC
Start: 2021-12-02 — End: 2022-07-08

## 2021-12-02 NOTE — Progress Notes (Unsigned)
Have you had covid in the past? ***  When ***  Have you had the covid vaccine? *** When ***  Cerra Marotta Garmon is a 66 y.o. year old who has been ill for *** days*** weeks *** since the 1st.    She is exposed to people with illness from her work  She flew home over two days.  She has not*** had a flu shot this year.  She has not*** had a cough and no*** runny/stuffy nose.  She has not*** had itchy eyes, ears, nose or throat.  She has had no *** fever and no *** chills.  This was not*** measured ***.   She has no*** myalgia.  She has not*** had loss of sense of smell or taste.   She has not*** had sore throat.  She is not*** hoarse.  She has no*** headache and no*** earache.  Kaegan has had no*** nausea, vomiting*** or no*** diarrhea.  She has not*** been eating as well*** and has not*** been sleeping as well as usual*** due to ***.   She has been taking *** with no*** symptom improvement***.  She does not***  smoke.  Toniah Wynkoop Pitcock has not*** traveled recently***.      Patients meds and allergies are reviewed today and there are *** changes in family history.  See electronic record for details.    ***

## 2021-12-02 NOTE — Patient Instructions (Signed)
Adenovirus

## 2021-12-06 IMAGING — DX DG CHEST 2V
2 series · 2 of 2 positions shown · non-contrast
Comparison: None.

CLINICAL DATA: Shortness of breath and cough.

EXAM:
CHEST - 2 VIEW

[chest pa]
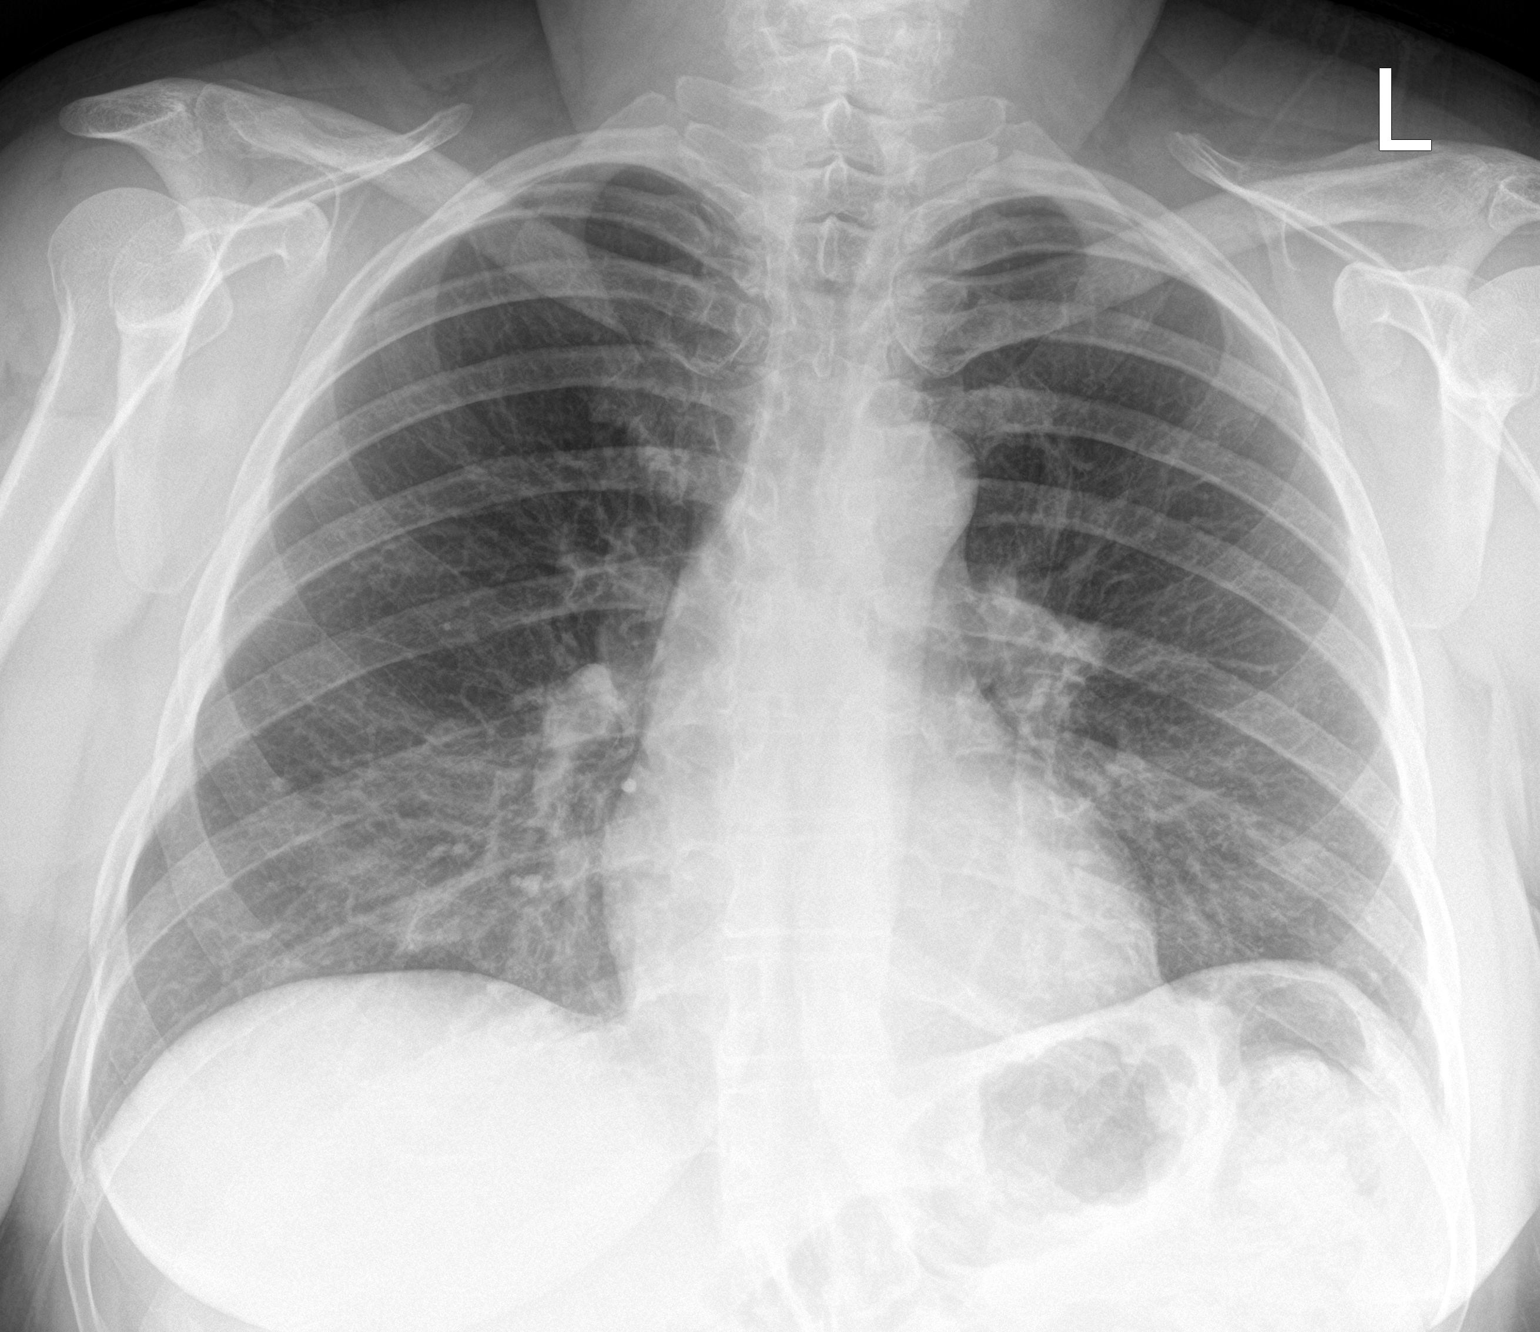

[chest lat]
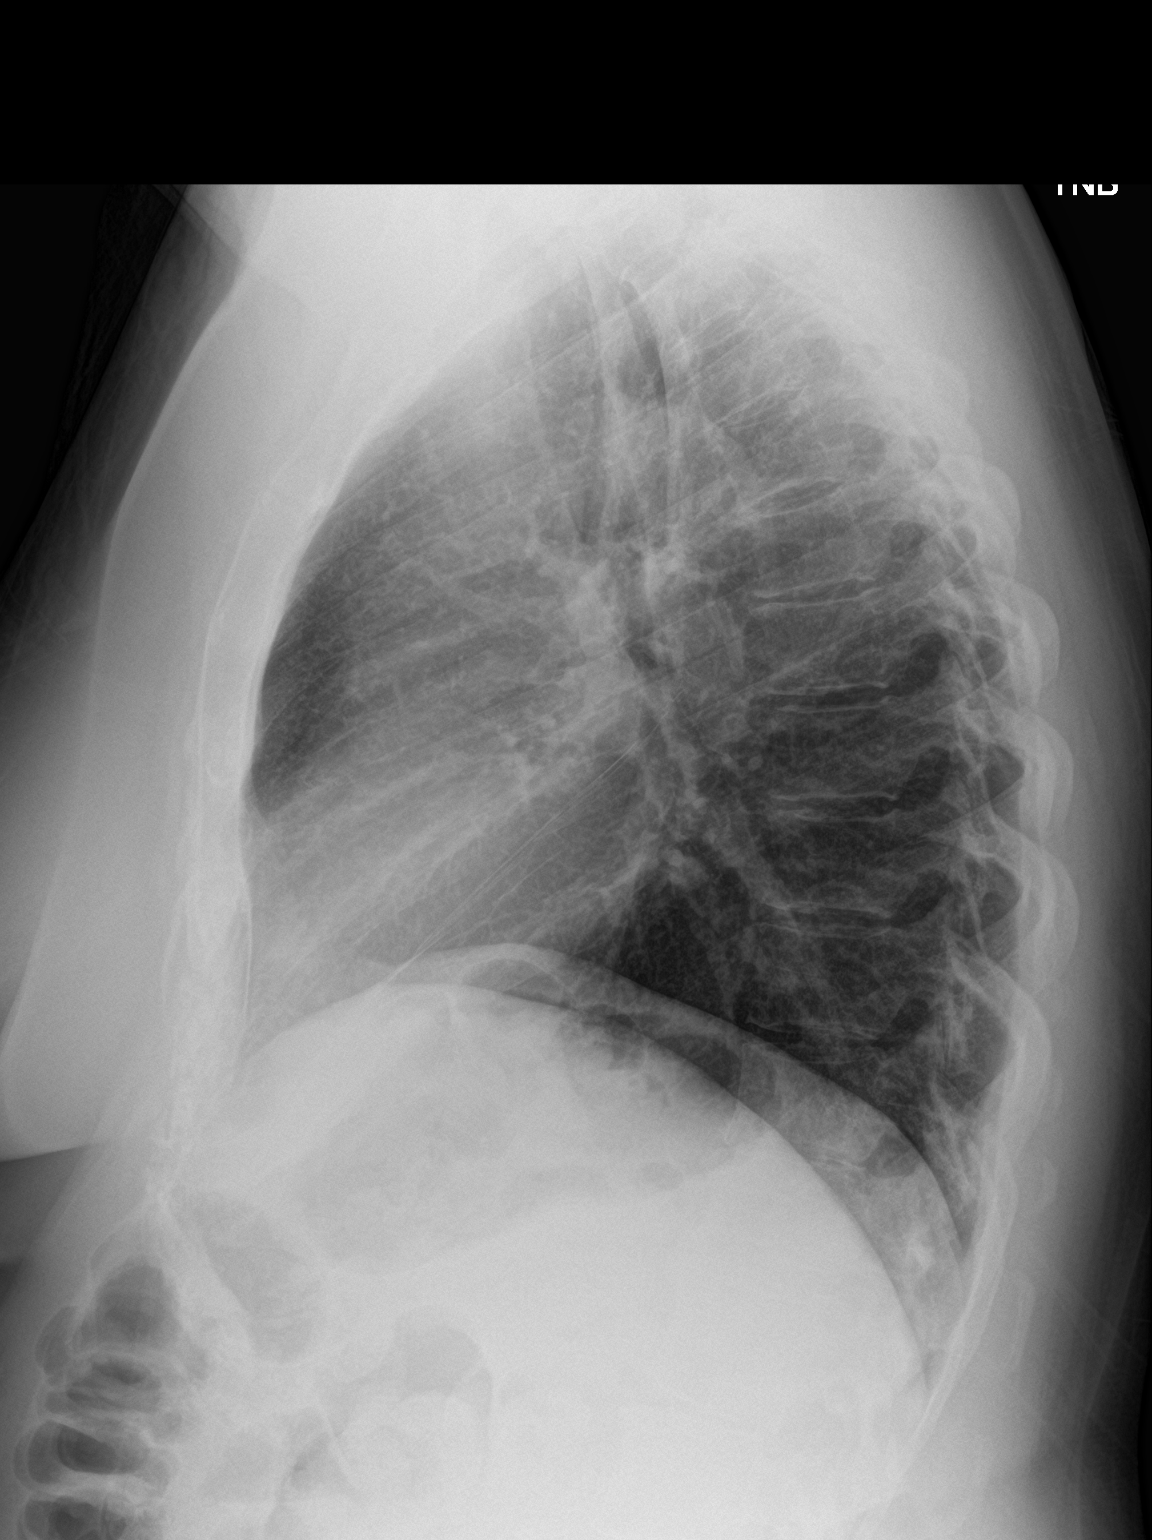

[2 of 2 positions shown; findings below may reference images not displayed]

FINDINGS: The heart size and mediastinal contours are within normal limits.
There is no evidence of pulmonary edema, consolidation,
pneumothorax, nodule or pleural fluid. The visualized skeletal
structures are unremarkable.
IMPRESSION: No active cardiopulmonary disease.

## 2021-12-15 ENCOUNTER — Other Ambulatory Visit: Payer: Self-pay | Admitting: Primary Care

## 2021-12-15 MED ORDER — PANTOPRAZOLE SODIUM 20 MG PO TBEC *I*
20.0000 mg | DELAYED_RELEASE_TABLET | Freq: Every day | ORAL | 1 refills | Status: DC
Start: 2021-12-15 — End: 2022-07-09

## 2021-12-15 MED ORDER — TOLTERODINE TARTRATE 4 MG PO CP24 *I*
4.0000 mg | ORAL_CAPSULE | Freq: Every day | ORAL | 0 refills | Status: DC
Start: 2021-12-15 — End: 2022-03-12

## 2021-12-15 NOTE — Telephone Encounter (Signed)
Patient's husband called in regards to patient's refill as patient travels for her job and is currently in [place].    Please refill both for 90 days and she has changed pharmacy to [facility].            Last office visit with doctor:   12/02/2021  Last office visit with APP:   11/28/2021  Patients upcoming appointments:  Future Appointments   Date Time Provider Department Center   07/10/2022  8:30 AM Diloreto, Onalee Hua, MD [facility] None     Recent Lab results:  GENERAL CHEMISTRY   Recent Labs     08/29/21  1020   NA 140   K 4.6   CL 102   CO2 28   GAP 10   UN 13   CREAT 0.74   GLU 91   CA 10.1      LIPID PROFILE   Recent Labs     11/10/21  0757 08/29/21  1020   CHOL 238* 264*   TRIG 197* 186*   HDL 52 55   LDLC 147* 172*      LIVER PROFILE   Recent Labs     08/29/21  1020   ALT 30   AST 26   ALK 94   TB 0.5      DIABETES THYROID   Recent Labs     08/29/21  1020   HA1C 5.7*    No value within the past 365 days      Pending/Orders Labs:  Lab Frequency Next Occurrence   Mammography screening BILATERAL Once 08/30/2021   Aerobic culture Once 09/26/2021   Urinalysis with reflex to microscopic Monthly + PRN         .pcn

## 2021-12-22 ENCOUNTER — Encounter: Payer: Self-pay | Admitting: Primary Care

## 2021-12-23 ENCOUNTER — Other Ambulatory Visit: Payer: Self-pay | Admitting: Primary Care

## 2021-12-23 ENCOUNTER — Telehealth: Payer: Self-pay | Admitting: Primary Care

## 2021-12-23 MED ORDER — AMOXICILLIN-POT CLAVULANATE 500-125 MG PO TABS *I*
1.0000 | ORAL_TABLET | Freq: Three times a day (TID) | ORAL | 0 refills | Status: DC
Start: 2021-12-23 — End: 2022-07-08

## 2021-12-23 NOTE — Telephone Encounter (Signed)
See mychart messages

## 2021-12-23 NOTE — Telephone Encounter (Signed)
Patient spouse called on her behalf states patient ear pain /infection is coming back requesting another prescription for antibiotics      pharmacy [facility]       Can call [phone]

## 2022-03-12 ENCOUNTER — Other Ambulatory Visit: Payer: Self-pay | Admitting: Primary Care

## 2022-03-12 NOTE — Telephone Encounter (Signed)
Last office visit with doctor:   12/02/2021  Last office visit with APP:   11/28/2021  Patients upcoming appointments:  Future Appointments   Date Time Provider Department Center   07/10/2022  8:30 AM Diloreto, Onalee Hua, MD [facility] None     Recent Lab results:  GENERAL CHEMISTRY   Recent Labs     08/29/21  1020   NA 140   K 4.6   CL 102   CO2 28   GAP 10   UN 13   CREAT 0.74   GLU 91   CA 10.1      LIPID PROFILE   Recent Labs     11/10/21  0757 08/29/21  1020   CHOL 238* 264*   TRIG 197* 186*   HDL 52 55   LDLC 147* 172*      LIVER PROFILE   Recent Labs     08/29/21  1020   ALT 30   AST 26   ALK 94   TB 0.5      DIABETES THYROID   Recent Labs     08/29/21  1020   HA1C 5.7*    No value within the past 365 days      Pending/Orders Labs:  Lab Frequency Next Occurrence   Mammography screening BILATERAL Once 08/30/2021   Aerobic culture Once 09/26/2021        .pcn

## 2022-03-27 ENCOUNTER — Telehealth: Payer: Self-pay | Admitting: Primary Care

## 2022-03-27 ENCOUNTER — Other Ambulatory Visit
Admission: RE | Admit: 2022-03-27 | Discharge: 2022-03-27 | Disposition: A | Discharge status: Home / Self Care | Payer: No Typology Code available for payment source | Source: Ambulatory Visit | Attending: Primary Care | Admitting: Primary Care

## 2022-03-27 DIAGNOSIS — N390 Urinary tract infection, site not specified: Secondary | ICD-10-CM

## 2022-03-27 MED ORDER — NITROFURANTOIN MONOHYD MACRO 100 MG PO CAPS *I*
100.0000 mg | ORAL_CAPSULE | Freq: Two times a day (BID) | ORAL | 0 refills | Status: AC
Start: 2022-03-27 — End: 2022-04-01

## 2022-03-27 NOTE — Telephone Encounter (Signed)
I sent her Macrobid to start this weekend for suspected UTI. I can see that the urine culture was collected so when this results we will let her know if med needs to be changed

## 2022-03-27 NOTE — Telephone Encounter (Signed)
Writer contact patient she is aware that medication was sent and if medication needs to be change she will contact patient over the weekend.

## 2022-03-27 NOTE — Telephone Encounter (Signed)
Patient called wanted to know if she could get some medication sent to the pharmacy for a UTI. Patient state that she has a long extend it order in due to she always get UTI. Patient also state that she send a urine sample to the lab at [facility].

## 2022-03-30 LAB — AEROBIC CULTURE

## 2022-04-03 NOTE — Telephone Encounter (Signed)
Patient called wanted to know if she could get a refill on medication that was given to her last week for her UTI. Patient also state that she is away for work and her spouse could overnight the medication to her .

## 2022-04-03 NOTE — Telephone Encounter (Signed)
I don't have a problem prescribing in [place].  However, if she is not better after five days of the correct medicine - she needs a new culture.

## 2022-04-03 NOTE — Telephone Encounter (Signed)
LM to CB

## 2022-04-03 NOTE — Telephone Encounter (Signed)
Patient called in see patient mychart message    Requesting to know the results of urine sample and wants to know if she is actually being prescribed the correct medication    Please contact patient via mychart unable to talk on the phone

## 2022-04-03 NOTE — Telephone Encounter (Signed)
Pt called back. °

## 2022-04-03 NOTE — Telephone Encounter (Signed)
Left message for patient to call office per message from PCP. Patient active on MyChart - message sent.

## 2022-04-03 NOTE — Telephone Encounter (Signed)
If she is out of [place], I legally cannot treat her. Please clarify

## 2022-05-14 ENCOUNTER — Encounter: Payer: Self-pay | Admitting: Medical

## 2022-05-14 DIAGNOSIS — Z1211 Encounter for screening for malignant neoplasm of colon: Secondary | ICD-10-CM

## 2022-06-10 ENCOUNTER — Other Ambulatory Visit: Payer: Self-pay | Admitting: Primary Care

## 2022-06-10 NOTE — Telephone Encounter (Signed)
Last office visit with doctor:   12/02/2021  Last office visit with APP:   11/28/2021  Patients upcoming appointments:  Future Appointments   Date Time Provider Department Center   07/10/2022  8:30 AM Diloreto, Onalee Hua, MD [facility] None     Recent Lab results:  GENERAL CHEMISTRY   Recent Labs     08/29/21  1020   NA 140   K 4.6   CL 102   CO2 28   GAP 10   UN 13   CREAT 0.74   GLU 91   CA 10.1      LIPID PROFILE   Recent Labs     11/10/21  0757 08/29/21  1020   CHOL 238* 264*   TRIG 197* 186*   HDL 52 55   LDLC 147* 172*      LIVER PROFILE   Recent Labs     08/29/21  1020   ALT 30   AST 26   ALK 94   TB 0.5      DIABETES THYROID   Recent Labs     08/29/21  1020   HA1C 5.7*    No value within the past 365 days      Pending/Orders Labs:  Lab Frequency Next Occurrence   Mammography screening BILATERAL Once 08/30/2021        .pcn

## 2022-06-15 ENCOUNTER — Other Ambulatory Visit: Payer: Self-pay | Admitting: Gastroenterology

## 2022-06-29 ENCOUNTER — Encounter: Payer: Self-pay | Admitting: Medical

## 2022-06-29 NOTE — Telephone Encounter (Signed)
Positive COVID test      1. Significant trouble breathing (i.e. can not talk in full sentences)?   no      Additional information:  On what date did your symptoms start? FRIDAY 06/26/2022    On what date did you test positive? 06/29/2022    Wheezing?  yes  Temperature greater than 100.4 F?  no  Nasal congestion?   yes  Sore throat?  yes  Nausea?  no  Vomiting?  no  Diarrhea?  yes  Headches?  yes  Body aches?  yes  Chills?  yes  Is the patient requesting an antiviral medication?  yes         Patient send a my chart message stating " I am in [place] and Covid +. Can you prescribe Paxlovid? .     Patient wants to know if she could get medication sent to a pharmacy in  [place] is there working.     [facility]   [address] [place], [place] [address]  Phone: [phone]

## 2022-06-29 NOTE — Telephone Encounter (Signed)
Patient spouse called in and inquired if patient is able to do a video visit with PA , have the rx be sent to a local pharmacy IN [place] and her husband can pick up and send to her in [place] himself??    If able to do so front staff will contact patient and schedule a telemedicine visit

## 2022-06-29 NOTE — Telephone Encounter (Signed)
Pt aware that Rx cannot be written in another state. She did reply that really sucks as her friend is in [place] got a rx from hr Dr. Then she hung up on Clinical research associate.

## 2022-06-29 NOTE — Telephone Encounter (Signed)
This Clinical research associate spoke with patient and explained that we are prohibited from doing video visits with patients if the patient is located outside of the state of [place] and the provider in question is not licensed in the state where the patient is located.    Patient very disagreeable. States that her daughter is an Teacher, early years/pre and she states that her daughter told her that we can do it. I explained that the legal department at [facility] has prohibited our provider from doing this for legal reasons.     Patient states she is too sick to argue and reports that she has an appointment with someone where she is located and she then hung up on me.

## 2022-07-08 ENCOUNTER — Other Ambulatory Visit: Payer: Self-pay | Admitting: Primary Care

## 2022-07-08 ENCOUNTER — Encounter: Payer: Self-pay | Admitting: Medical

## 2022-07-08 ENCOUNTER — Other Ambulatory Visit: Payer: Self-pay

## 2022-07-08 ENCOUNTER — Ambulatory Visit: Payer: No Typology Code available for payment source | Admitting: Medical

## 2022-07-08 VITALS — Ht 62.0 in | Wt 200.0 lb

## 2022-07-08 DIAGNOSIS — R058 Other specified cough: Secondary | ICD-10-CM

## 2022-07-08 DIAGNOSIS — Z8616 Personal history of COVID-19: Secondary | ICD-10-CM

## 2022-07-08 MED ORDER — ALBUTEROL SULFATE HFA 108 (90 BASE) MCG/ACT IN AERS *I*
1.0000 | INHALATION_SPRAY | RESPIRATORY_TRACT | 1 refills | Status: DC | PRN
Start: 2022-07-08 — End: 2024-04-05

## 2022-07-08 MED ORDER — PREDNISONE 20 MG PO TABS *I*
ORAL_TABLET | ORAL | 0 refills | Status: DC
Start: 2022-07-08 — End: 2023-03-31

## 2022-07-08 MED ORDER — BUDESONIDE-FORMOTEROL FUMARATE 80-4.5 MCG/ACT IN AERO *I*
2.0000 | INHALATION_SPRAY | Freq: Two times a day (BID) | RESPIRATORY_TRACT | 0 refills | Status: DC
Start: 2022-07-08 — End: 2023-06-04

## 2022-07-08 NOTE — Progress Notes (Signed)
[facility] [place]: Outpatient Acute Visit Note    Video Visit   Location of Patient: home  Location of Telemedicine Provider: hospital / clinical location  Other participants in telemedicine encounter and roles:  Not applicable  This is an established patient visit.  Reason for visit: cough  Patient's problem list, allergies, and medications were reviewed and updated as appropriate. Please see the EHR for full details.  Consent was obtained from the patient to complete this video visit; including the potential for financial liability.    Subjective:  Gaylan Tausch is a 66 y.o. female  here for an acute visit today for the following issue:    Chief Complaint   Patient presents with    Other     ZOOM, neg. Covid, coughing fits lasting 1/2 hr. stood outside and that helped.       Patient is presenting with persistent cough and respiratory symptoms since recent COVID diagnosis.  She was on work travel to [place] and thinks she was exposed to COVID during this travel, came down with COVID and tested positive on 06/29/2022.  At the time her symptoms included malaise, fatigue, cough, congestion and loss of taste/smell.  She was treated with supportive therapy, she did get a prescription for Paxlovid therapy from an urgent care provider down in [place], but reports she never ended up taking this therapy pack.  Most of her symptoms have improved however she continues to have coughing and chest congestion.  Her chest feels tight and she feels short of breath when getting into coughing fits, however she has no overt chest pain, chest pressure, shortness of breath at rest or dyspnea on exertion.  She has been taking OTC DayQuil and NyQuil with minimal relief in symptoms.      ROS:   As above.    The patient's medications were reviewed and reconciled with the patient.  The patient's allergies were reviewed and confirmed with the patient.  Pertinent portions of the patient's past medical, surgical, social and/or  family histories were reviewed and updated.      Objective: This visit was performed during a pandemic event. The limited physical exam based on observations via phone are as follows:  Ht 1.575 m (5\' 2" )   Wt 90.7 kg (200 lb)   BMI 36.58 kg/m    General: NAD, normal mood and affect; answers questions appropriately  Lungs: Pulmonary effort is normal.  Patient is speaking in full sentences without any apparent distress.      Assessment and Plan:    1. Post-viral cough syndrome  predniSONE (DELTASONE) 20 mg tablet    albuterol HFA (PROVENTIL, VENTOLIN, PROAIR HFA) 108 (90 Base) MCG/ACT inhaler    budesonide-formoterol (SYMBICORT) 80-4.5 MCG/ACT inhaler      2. History of COVID-19  predniSONE (DELTASONE) 20 mg tablet    albuterol HFA (PROVENTIL, VENTOLIN, PROAIR HFA) 108 (90 Base) MCG/ACT inhaler    budesonide-formoterol (SYMBICORT) 80-4.5 MCG/ACT inhaler        1. Post-viral cough syndrome  2. History of COVID-19  Patient with recent history of COVID now having ongoing respiratory symptoms and exertional dyspnea/chest tightness.  Symptoms appear to be consistent with postviral syndrome, explained to patient that we are seeing lingering respiratory symptoms even in the absence of prior lung disease after COVID due to inflammatory nature of illness.  Discussed bronchial hyperreactivity phenomenon which is mimicking asthma symptoms.  RX Prednisone and advised patient to follow package instructions for taper. Take with  breakfast. Avoid NSAIDs while on this medication. Tylenol is okay if needed.  RX Albuterol inhaler to use as-needed for acute SOB or wheezing.   RX Symbicort inhaler to use twice daily for maintenance therapy.  Advised patient to monitor for symptom worsening or development of new symptoms including chest pain, shortness of breath, fever, chills, sweats, weakness, nausea, or vomiting.  - predniSONE (DELTASONE) 20 mg tablet; Take 2 tablets (40 mg total) daily for three days, take 1 tablet (20 mg total) for  three days, take 0.5 tablet (10 mg total) daily for four days  Dispense: 11 tablet; Refill: 0  - albuterol HFA (PROVENTIL, VENTOLIN, PROAIR HFA) 108 (90 Base) MCG/ACT inhaler; Inhale 1-2 puffs into the lungs every 4-6 hours as needed for Wheezing or Shortness of Breath  Shake well before each use.  Dispense: 1 each; Refill: 1  - budesonide-formoterol (SYMBICORT) 80-4.5 MCG/ACT inhaler; Inhale 2 puffs into the lungs 2 times daily  Shake well before each use.  Dispense: 1 each; Refill: 0      The following patient instructions were reviewed verbally with the patient, printed on the after visit summary and given to patient at the end of the visit.    Patient Instructions   Did you know, self check-in is quick, easy and protects your privacy? We encourage you, before your next visit with [place], to check in from home through your MyChart app and let us know that you have arrived to your appointment on our Welcome Kiosk located in the main lobby. If you don't have a MyChart account, you can check in on our Welcome Kiosk upon arrival to the office. This enhanced check-in option makes your check-in and rooming experience faster, more efficient and offers you more privacy. And it will give you more time to spend with your provider. Our staff will always be available to help!     Patient is instructed to call office if symptoms worsen or do not improve with above recommended treatment.    Patient is to keep next scheduled appt as previously scheduled.   Future Appointments         Provider Department Center    07/08/2022 4:30 PM Kema Santaella, Mikey Kirschner, [place] [facility] [place]  Arrive at: Video Visit with Patient     07/10/2022 8:30 AM Diloreto, Onalee Hua, MD [facility] [address] [facility]            Documentation for Time-Based Billing:  I personally spent 30 minutes on 07/08/22 and this included a combination of review of labs, review of consultant notes, review of hospital records, pre-visit chart review, huddle and  preparation with nursing prior to the visit, seeing and caring for the patient via telehealth video visit, completing the note, updating the chart and/or communicating with other care team members.     Christen Butter, PA-C  Family Medicine  07/08/22  4:58 PM    This note has been dictated using Dragon software.  Reasonable attempts to correct typing mistakes have been done.  Please excuse any inherent inaccuracy that might have escaped.

## 2022-07-08 NOTE — Patient Instructions (Signed)
Did you know, self check-in is quick, easy and protects your privacy? We encourage you, before your next visit with us, to check in from home through your MyChart app and let us know that you have arrived to your appointment on our Welcome Kiosk located in the main lobby. If you don't have a MyChart account, you can check in on our Welcome Kiosk upon arrival to the office. This enhanced check-in option makes your check-in and rooming experience faster, more efficient and offers you more privacy. And it will give you more time to spend with your provider. Our staff will always be available to help!

## 2022-07-09 ENCOUNTER — Ambulatory Visit: Payer: No Typology Code available for payment source | Admitting: Primary Care

## 2022-07-09 NOTE — Assessment & Plan Note (Signed)
PVD with VH and retinal tears  --s/p barrier LPC OD (05/20/14)  --s/p barrier LPC OD (05/21/14)  --s/p barrier LPC OD (05/24/14)   --doing great, no new heme or tears  --f/u 1 yr OCT OU, dilated OU  --SSX of RD explained and understood    Chronic pseudophakic ME OS  --hx of RD repair x 3 (2011), now resolved    Hx RD repair OS  --RD repair x  3 (2011)  --See above    PCIOL OS  --PCO OS, poor visual prognosis, follow

## 2022-07-09 NOTE — Telephone Encounter (Signed)
Last office visit with doctor:   12/02/2021  Last office visit with APP:   11/28/2021  Patients upcoming appointments:  Future Appointments   Date Time Provider Department Center   07/10/2022  8:30 AM Diloreto, Onalee Hua, MD [facility] None     Recent Lab results:  GENERAL CHEMISTRY   Recent Labs     08/29/21  1020   NA 140   K 4.6   CL 102   CO2 28   GAP 10   UN 13   CREAT 0.74   GLU 91   CA 10.1      LIPID PROFILE   Recent Labs     11/10/21  0757 08/29/21  1020   CHOL 238* 264*   TRIG 197* 186*   HDL 52 55   LDLC 147* 172*      LIVER PROFILE   Recent Labs     08/29/21  1020   ALT 30   AST 26   ALK 94   TB 0.5      DIABETES THYROID   Recent Labs     08/29/21  1020   HA1C 5.7*    No value within the past 365 days      Pending/Orders Labs:  Lab Frequency Next Occurrence   Mammography screening BILATERAL Once 08/30/2021   OCT, mac-OU Once 07/10/2022        .pcn

## 2022-07-10 ENCOUNTER — Ambulatory Visit: Payer: No Typology Code available for payment source | Admitting: Retina Ophthalmology

## 2022-07-10 DIAGNOSIS — H33311 Horseshoe tear of retina without detachment, right eye: Secondary | ICD-10-CM

## 2022-07-30 NOTE — Assessment & Plan Note (Signed)
PVD with VH and retinal tears  --s/p barrier LPC OD (05/20/14)  --s/p barrier LPC OD (05/21/14)  --s/p barrier LPC OD (05/24/14)   --doing great, no new heme or tears  --f/u 1 yr OCT OU, dilated OU  --SSX of RD explained and understood    Chronic pseudophakic ME OS  --hx of RD repair x 3 (2011), now resolved    Hx RD repair OS  --RD repair x  3 (2011)  --See above    PCIOL OS  --PCO OS, poor visual prognosis, follow

## 2022-07-31 ENCOUNTER — Ambulatory Visit: Payer: No Typology Code available for payment source | Admitting: Retina Ophthalmology

## 2022-09-04 ENCOUNTER — Telehealth: Payer: Self-pay | Admitting: Retina Ophthalmology

## 2022-09-04 NOTE — Telephone Encounter (Signed)
09/04/2022    I wanted you to be aware that the following patient has cancelled their appointment to 09/18/22    Provider Name: Dr Johnnette Litter   Date of Appointment: 09/11/22   Patient Name: Veronica Bishop   MRN: [id number]   DOB: 1955-09-12       Reason for Cancellation:  Patient's husband rescheduled on behalf of his wife        Thank you,  Samantha Crimes

## 2022-09-09 ENCOUNTER — Other Ambulatory Visit: Payer: Self-pay | Admitting: Medical

## 2022-09-09 NOTE — Telephone Encounter (Signed)
Last office visit with doctor:   12/02/2021  Last office visit with APP:   11/28/2021  Patients upcoming appointments:  Future Appointments   Date Time Provider Department Center   09/18/2022  8:30 AM Diloreto, Onalee Hua, MD [facility] None     Recent Lab results:  GENERAL CHEMISTRY   No value within the past 365 days   LIPID PROFILE   Recent Labs     11/10/21  0757   CHOL 238*   TRIG 197*   HDL 52   LDLC 147*      LIVER PROFILE   No value within the past 365 days   DIABETES THYROID   No value within the past 365 days No value within the past 365 days      Pending/Orders Labs:  Lab Frequency Next Occurrence   Mammography screening BILATERAL Once 08/30/2021   OCT, mac-OU Once 07/10/2022        .pcn

## 2022-09-11 ENCOUNTER — Ambulatory Visit: Payer: No Typology Code available for payment source | Admitting: Retina Ophthalmology

## 2022-09-17 NOTE — Assessment & Plan Note (Signed)
PVD with VH and retinal tears  --s/p barrier LPC OD (05/20/14)  --s/p barrier LPC OD (05/21/14)  --s/p barrier LPC OD (05/24/14)   --doing great, no new heme or tears  --f/u 1 yr OCT OU, dilated OU  --SSX of RD explained and understood    Chronic pseudophakic ME OS  --hx of RD repair x 3 (2011), now resolved    Hx RD repair OS  --RD repair x  3 (2011)  --See above    PCIOL OS  --PCO OS, poor visual prognosis, follow

## 2022-09-18 ENCOUNTER — Encounter: Payer: Self-pay | Admitting: Retina Ophthalmology

## 2022-09-18 ENCOUNTER — Other Ambulatory Visit: Payer: Self-pay

## 2022-09-18 ENCOUNTER — Ambulatory Visit: Payer: Medicare (Managed Care) | Attending: Retina Ophthalmology | Admitting: Retina Ophthalmology

## 2022-09-18 DIAGNOSIS — H2511 Age-related nuclear cataract, right eye: Secondary | ICD-10-CM | POA: Insufficient documentation

## 2022-09-18 DIAGNOSIS — H26492 Other secondary cataract, left eye: Secondary | ICD-10-CM | POA: Insufficient documentation

## 2022-09-18 DIAGNOSIS — Z961 Presence of intraocular lens: Secondary | ICD-10-CM | POA: Insufficient documentation

## 2022-09-18 DIAGNOSIS — H33311 Horseshoe tear of retina without detachment, right eye: Secondary | ICD-10-CM | POA: Insufficient documentation

## 2022-09-18 DIAGNOSIS — H43811 Vitreous degeneration, right eye: Secondary | ICD-10-CM | POA: Insufficient documentation

## 2022-09-18 DIAGNOSIS — H35352 Cystoid macular degeneration, left eye: Secondary | ICD-10-CM | POA: Insufficient documentation

## 2022-09-18 NOTE — Progress Notes (Signed)
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          Current Outpatient Medications:     tolterodine (DETROL LA) 4 mg 24 hr capsule, TAKE 1 CAPSULE BY MOUTH EVERY DAY SWALLOW WHOLE, DO NOT BREAK, CRUSH OR CHEW, Disp: 90 capsule, Rfl: 0    pantoprazole (PROTONIX) 20 mg EC tablet, TAKE 1 TABLET BY MOUTH EVERY DAY SWALLOW WHOLE, DO NOT BREAK, CRUSH OR CHEW, Disp: 90 tablet, Rfl: 0    predniSONE (DELTASONE) 20 mg tablet, Take 2 tablets (40 mg total) daily for three days, take 1 tablet (20 mg total) for three days, take 0.5 tablet (10 mg total) daily for four days, Disp: 11 tablet, Rfl: 0    albuterol HFA (PROVENTIL, VENTOLIN, PROAIR HFA) 108 (90 Base) MCG/ACT inhaler, Inhale 1-2 puffs into the lungs every 4-6 hours as needed for Wheezing or Shortness of Breath  Shake well before each use., Disp: 1 each, Rfl: 1    budesonide-formoterol (SYMBICORT) 80-4.5 MCG/ACT inhaler, Inhale 2 puffs into the lungs 2 times daily  Shake well before each use., Disp: 1 each, Rfl: 0    EPINEPHrine (EPIPEN) 0.3 mg/0.3 mL auto-injector, INJECT 0.3ML INTRAMUSCULARLY AS DIRECTED, Disp: 2 each, Rfl: 1    naproxen sodium (ANAPROX) 220 MG tablet, Take 1 tablet (220 mg total) by mouth as needed, Disp: , Rfl:     aspirin 81 MG tablet, Take 1 tablet (81 mg total) by mouth daily, Disp: , Rfl:   Sulfa antibiotics, Sodium lauryl sulfate, and No known latex allergy   No birth history on file.  Past Medical History:   Diagnosis Date     Cataract     Chronic kidney disease     IC (interstitial cystitis)     Macular edema     PERSISITENT PSEUDOPHAKIC OS    PVD (posterior vitreous detachment), right 05/21/2014    Refractive error 03/27/2011    Varicella     Vitreous hemorrhage 05/21/2014      Past Surgical History:   Procedure Laterality Date    25G PPV/IVT/AFX/EL/22%SF6 OS  05/16/09    Avastin #1 OS 08/14/11 consent 08/14/11 maxitrol      barrier laser, right eye  05/20/14    Dr Karalee Height    BARRIER LPC OS  07/30/09    BUNIONECTOMY      Hallux Valgus (Bunion) Correction Conversion Data     CATARACT REMOVAL  10/24/09    PC IOL OS    CESAREAN SECTION, CLASSIC      Cesarean Section Conversion Data     HX TONSILLECTOMY/ADENOIDECTOMY      Tonsillectomy Conversion Data     INTRA VITREAL TRIESENCE OS  05/30/10    4MG     PHACO/IOL/25G PPV/PCOX/MP/AFX/ICG/ILMX/14% C3 F8 OS  10/24/09  RETINAL DETACHMENT SURGERY      X 3    SB/20G PPV/MO/PFC/RTX/AFX/15% C3 F8 OS  06/20/09      Social History     Tobacco Use   Smoking Status Former    Types: Cigarettes    Quit date: 02/15/1981    Years since quitting: 41.6   Smokeless Tobacco Never      Social History     Substance and Sexual Activity   Alcohol Use Yes      Social History     Substance and Sexual Activity   Drug Use No      Specialty Problems          Ophthalmology Problems    Cataract        Cystoid macular edema of left eye        Refractive error        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       Objective:   Objective There were no vitals filed for this visit.    Base Eye Exam       Visual Acuity (Snellen - Linear)         Right Left    Dist sc  20/25 CF 38ft    Dist ph sc 20/20       Correction: Glasses   Monitor was not working at the time of working pt up             Tonometry (Tonopen, 8:33 AM)         Right Left    Pressure 16 16              Pupils         Dark Shape APD    Right 4 Round None    Left 4 Round None              Neuro/Psych       Oriented x3: Yes    Mood/Affect: Normal              Dilation       Both eyes: 2.5% Phenylephrine, 1.0% Tropicamide, 0.5% Proparacaine @ 8:33 AM                  Slit Lamp and Fundus Exam       External Exam         Right Left    External Normal Normal              Slit Lamp Exam         Right Left    Lids/Lashes Normal Normal    Conjunctiva/Sclera White and quiet White and quiet    Cornea Clear Synechia    Anterior Chamber Deep and quiet Deep and quiet    Iris Round and reactive Round and reactive    Lens 1+ Nuclear sclerosis Posterior chamber intraocular lens; 4+ PCO    Vitreous Posterior vitreous detachment; mild VH clear              Fundus Exam  Right Left    Disc Normal Normal    C/D Ratio 0.3 0.5    Macula Normal SR band, resolved ME    Vessels Normal attenuated    Periphery Multiple tears scattered 360 all lasered with no new heme inf 180 Retx; attached, Laser scar, Macula attached, laser                                       Assessment/Plan:   Assessment 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

## 2022-11-16 ENCOUNTER — Encounter: Payer: Self-pay | Admitting: Gastroenterology

## 2022-11-16 LAB — HM COLONOSCOPY

## 2022-12-04 ENCOUNTER — Other Ambulatory Visit: Payer: Self-pay | Admitting: Primary Care

## 2022-12-04 NOTE — Telephone Encounter (Signed)
Last office visit with doctor:   12/02/2021  Last office visit with APP:   Visit date not found  Patients upcoming appointments:  Future Appointments   Date Time Provider Department Center   10/01/2023  8:30 AM Diloreto, Onalee Hua, MD [facility] None     Recent Lab results:  GENERAL CHEMISTRY   No value within the past 365 days   LIPID PROFILE   No value within the past 365 days   LIVER PROFILE   No value within the past 365 days   DIABETES THYROID   No value within the past 365 days No value within the past 365 days      Pending/Orders Labs:  Lab Frequency Next Occurrence        .pcn

## 2023-03-07 ENCOUNTER — Other Ambulatory Visit: Payer: Self-pay | Admitting: Primary Care

## 2023-03-08 NOTE — Telephone Encounter (Signed)
Last office visit with doctor:   12/02/2021  Last office visit with APP:   Visit date not found  Patients upcoming appointments:  Future Appointments   Date Time Provider Department Center   10/01/2023  8:30 AM Diloreto, Onalee Hua, MD [facility] None     Recent Lab results:  GENERAL CHEMISTRY   No value within the past 365 days   LIPID PROFILE   No value within the past 365 days   LIVER PROFILE   No value within the past 365 days   DIABETES THYROID   No value within the past 365 days No value within the past 365 days      Pending/Orders Labs:  Lab Frequency Next Occurrence        .pcn

## 2023-03-25 ENCOUNTER — Telehealth: Payer: Self-pay | Admitting: Primary Care

## 2023-03-25 DIAGNOSIS — Z136 Encounter for screening for cardiovascular disorders: Secondary | ICD-10-CM

## 2023-03-25 DIAGNOSIS — E559 Vitamin D deficiency, unspecified: Secondary | ICD-10-CM

## 2023-03-25 NOTE — Telephone Encounter (Signed)
Writer sent MyChart message

## 2023-03-25 NOTE — Telephone Encounter (Signed)
Patient scheduled phy for 8/7 labs pended     Per patient OK to send MyChart message

## 2023-03-26 ENCOUNTER — Emergency Department: Payer: Medicare (Managed Care)

## 2023-03-26 ENCOUNTER — Other Ambulatory Visit: Payer: Self-pay

## 2023-03-26 ENCOUNTER — Encounter: Payer: Self-pay | Admitting: Gastroenterology

## 2023-03-26 ENCOUNTER — Emergency Department
Admission: EM | Admit: 2023-03-26 | Discharge: 2023-03-26 | Disposition: A | Discharge status: Home / Self Care | Payer: Medicare (Managed Care) | Source: Ambulatory Visit | Attending: Emergency Medicine | Admitting: Emergency Medicine

## 2023-03-26 DIAGNOSIS — R0602 Shortness of breath: Secondary | ICD-10-CM

## 2023-03-26 DIAGNOSIS — D259 Leiomyoma of uterus, unspecified: Secondary | ICD-10-CM | POA: Insufficient documentation

## 2023-03-26 DIAGNOSIS — K529 Noninfective gastroenteritis and colitis, unspecified: Secondary | ICD-10-CM | POA: Insufficient documentation

## 2023-03-26 DIAGNOSIS — R0789 Other chest pain: Secondary | ICD-10-CM

## 2023-03-26 DIAGNOSIS — K828 Other specified diseases of gallbladder: Secondary | ICD-10-CM | POA: Insufficient documentation

## 2023-03-26 DIAGNOSIS — R5383 Other fatigue: Secondary | ICD-10-CM

## 2023-03-26 DIAGNOSIS — R682 Dry mouth, unspecified: Secondary | ICD-10-CM

## 2023-03-26 DIAGNOSIS — R079 Chest pain, unspecified: Secondary | ICD-10-CM | POA: Insufficient documentation

## 2023-03-26 DIAGNOSIS — N3289 Other specified disorders of bladder: Secondary | ICD-10-CM | POA: Insufficient documentation

## 2023-03-26 DIAGNOSIS — N838 Other noninflammatory disorders of ovary, fallopian tube and broad ligament: Secondary | ICD-10-CM | POA: Insufficient documentation

## 2023-03-26 DIAGNOSIS — Z1152 Encounter for screening for COVID-19: Secondary | ICD-10-CM | POA: Insufficient documentation

## 2023-03-26 DIAGNOSIS — R112 Nausea with vomiting, unspecified: Secondary | ICD-10-CM

## 2023-03-26 DIAGNOSIS — R1084 Generalized abdominal pain: Secondary | ICD-10-CM

## 2023-03-26 DIAGNOSIS — Z789 Other specified health status: Secondary | ICD-10-CM

## 2023-03-26 LAB — CBC AND DIFFERENTIAL
Baso # K/uL: 0 10*3/uL (ref 0.0–0.2)
Eos # K/uL: 0.1 10*3/uL (ref 0.0–0.5)
Hematocrit: 41 % (ref 34–49)
Hemoglobin: 13.5 g/dL (ref 11.2–16.0)
Lymph # K/uL: 2.6 10*3/uL (ref 1.0–5.0)
MCV: 94 fL (ref 75–100)
Mono # K/uL: 1 10*3/uL (ref 0.1–1.0)
Neut # K/uL: 4.2 10*3/uL (ref 1.5–6.5)
Platelets: 256 10*3/uL (ref 150–450)
RBC: 4.4 MIL/uL (ref 4.0–5.5)
RDW: 13.9 % (ref 0.0–15.0)
Seg Neut %: 53.6 %
WBC: 7.8 10*3/uL (ref 3.5–11.0)

## 2023-03-26 LAB — URINALYSIS, DIPSTICK ONLY (STRONG WEST)
Blood,UA: NEGATIVE
Glucose,UA: NEGATIVE
Leuk Esterase,UA: NEGATIVE
Nitrite,UA: POSITIVE — AB
Protein,UA: NEGATIVE
Specific Gravity,UA: <=1.005 (ref 1.002–1.030)
pH,UA: 5 (ref 5.0–8.0)

## 2023-03-26 LAB — COVID/INFLUENZA A & B/RSV NAAT (PCR)
COVID-19 NAAT (PCR): NEGATIVE
Influenza A NAAT (PCR): NEGATIVE
Influenza B NAAT (PCR): NEGATIVE
RSV NAAT (PCR): NEGATIVE

## 2023-03-26 LAB — RUQ PANEL (ED ONLY)
ALT: 19 U/L (ref 0–35)
AST: 20 U/L (ref 0–35)
Albumin: 4.1 g/dL (ref 3.5–5.2)
Alk Phos: 83 U/L (ref 35–105)
Amylase: 32 U/L (ref 28–100)
Bilirubin,Direct: <0.2 mg/dL (ref 0.0–0.3)
Bilirubin,Total: 0.5 mg/dL (ref 0.0–1.2)
Lipase: 25 U/L (ref 13–60)
Total Protein: 6.6 g/dL (ref 6.3–7.7)

## 2023-03-26 LAB — BASIC METABOLIC PANEL
Anion Gap: 11 (ref 7–16)
CO2: 26 mmol/L (ref 20–28)
Calcium: 9.2 mg/dL (ref 8.6–10.2)
Chloride: 104 mmol/L (ref 96–108)
Creatinine: 0.77 mg/dL (ref 0.51–0.95)
Glucose: 109 mg/dL — ABNORMAL HIGH (ref 60–99)
Lab: 11 mg/dL (ref 6–20)
Potassium: 3.6 mmol/L (ref 3.3–5.1)
Sodium: 141 mmol/L (ref 133–145)
eGFR BY CREAT: 85 *

## 2023-03-26 LAB — TROPONIN T 0 HR HIGH SENSITIVITY (IP/ED ONLY): TROP T 0 HR High Sensitivity: 7 ng/L (ref 0–11)

## 2023-03-26 LAB — TROPONIN T 1 HR W/ DELTA HIGH SENSITIVITY
TROP T 0-1 HR DELTA High Sensitivity: 1 (ref 0–2)
TROP T 1 HR High Sensitivity: 8 ng/L (ref 0–11)

## 2023-03-26 LAB — PERFORMING LAB

## 2023-03-26 MED ORDER — METOCLOPRAMIDE HCL 5 MG/ML IJ SOLN *I*
10.0000 mg | Freq: Once | INTRAMUSCULAR | Status: AC
Start: 2023-03-26 — End: 2023-03-26
  Administered 2023-03-26: 10 mg via INTRAVENOUS
  Filled 2023-03-26: qty 2

## 2023-03-26 MED ORDER — ACETAMINOPHEN 500 MG PO TABS *I*
1000.0000 mg | ORAL_TABLET | Freq: Four times a day (QID) | ORAL | 0 refills | Status: AC | PRN
Start: 2023-03-26 — End: 2023-04-25

## 2023-03-26 MED ORDER — MORPHINE SULFATE 4 MG/ML IV SOLN *WRAPPED*
4.0000 mg | Freq: Once | INTRAVENOUS | Status: AC
Start: 2023-03-26 — End: 2023-03-26
  Administered 2023-03-26: 4 mg via INTRAVENOUS
  Filled 2023-03-26: qty 1

## 2023-03-26 MED ORDER — SODIUM CHLORIDE 0.9 % IV BOLUS *I*
1000.0000 mL | Freq: Once | Status: AC
Start: 2023-03-26 — End: 2023-03-26
  Administered 2023-03-26: 1000 mL via INTRAVENOUS

## 2023-03-26 MED ORDER — ONDANSETRON 4 MG PO TBDP *I*
4.0000 mg | ORAL_TABLET | Freq: Three times a day (TID) | ORAL | 0 refills | Status: DC | PRN
Start: 2023-03-26 — End: 2023-06-04

## 2023-03-26 MED ORDER — FAMOTIDINE IN NACL 20 MG/50ML IV SOLN *I*
20.0000 mg | Freq: Once | INTRAVENOUS | Status: AC
Start: 2023-03-26 — End: 2023-03-26
  Administered 2023-03-26: 20 mg via INTRAVENOUS
  Filled 2023-03-26: qty 50

## 2023-03-26 MED ORDER — ONDANSETRON HCL 2 MG/ML IV SOLN *I*
4.0000 mg | Freq: Once | INTRAMUSCULAR | Status: AC
Start: 2023-03-26 — End: 2023-03-26
  Administered 2023-03-26: 4 mg via INTRAVENOUS
  Filled 2023-03-26: qty 2

## 2023-03-26 MED ORDER — FAMOTIDINE 20 MG PO TABS *I*
20.0000 mg | ORAL_TABLET | Freq: Two times a day (BID) | ORAL | 0 refills | Status: DC
Start: 2023-03-26 — End: 2023-06-04

## 2023-03-26 MED ORDER — IOHEXOL 350 MG/ML (OMNIPAQUE) IV SOLN *I*
1.0000 mL | Freq: Once | INTRAVENOUS | Status: AC
Start: 2023-03-26 — End: 2023-03-26
  Administered 2023-03-26: 126 mL via INTRAVENOUS

## 2023-03-26 MED ORDER — LACTATED RINGERS IV SOLN *I*
100.0000 mL/h | INTRAVENOUS | Status: DC
Start: 2023-03-26 — End: 2023-03-26
  Administered 2023-03-26: 100 mL/h via INTRAVENOUS

## 2023-03-26 NOTE — ED Provider Notes (Signed)
History     Chief Complaint   Patient presents with    Chest Pain     67 yo F presents to ED with 4-5 days of malaise, central to upper abdominal pain with repeated episodes of nausea, vomiting, and diarrhea, who presents to ED tonight because she also started to develop lower chest pain in addition to upper abdominal pain. Her husband has a prior h/o CAD and PE himself, so he became concerned that she may be having the same as well. No known fevers. No shortness of breath. She was in [place] on vacation with family when she first started having symptoms; she states her daughter has been having some similar symptoms (placed on Azithromycin), but her husband is feeling fine. She has been trying imodium for her diarrhea. She describes feeling globally very weak and fatigued. No leg pain or swelling.      History provided by:  Patient        Medical/Surgical/Family History     Past Medical History:   Diagnosis Date    Cataract     Chronic kidney disease     IC (interstitial cystitis)     Macular edema     PERSISITENT PSEUDOPHAKIC OS    PVD (posterior vitreous detachment), right 05/21/2014    Refractive error 03/27/2011    Varicella     Vitreous hemorrhage 05/21/2014        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, right H33.311    Vitreous hemorrhage H43.10    PVD (posterior vitreous detachment), right H43.811            Past Surgical History:   Procedure  Laterality Date    25G PPV/IVT/AFX/EL/22%SF6 OS  05/16/09    Avastin #1 OS 08/14/11 consent 08/14/11 maxitrol      barrier laser, right eye  05/20/14    Dr Karalee Height    BARRIER LPC OS  07/30/09    BUNIONECTOMY      Hallux Valgus (Bunion) Correction Conversion Data     CATARACT REMOVAL  10/24/09    PC IOL OS    CESAREAN SECTION, CLASSIC      Cesarean Section Conversion Data     HX TONSILLECTOMY/ADENOIDECTOMY      Tonsillectomy Conversion Data     INTRA VITREAL TRIESENCE OS  05/30/10    4MG     PHACO/IOL/25G PPV/PCOX/MP/AFX/ICG/ILMX/14% C3 F8 OS  10/24/09    RETINAL DETACHMENT SURGERY      X 3    SB/20G PPV/MO/PFC/RTX/AFX/15% C3 F8 OS  06/20/09          Social History     Tobacco Use    Smoking status: Former     Types: Cigarettes     Quit date: 02/15/1981     Years since quitting: 42.1  Smokeless tobacco: Never   Substance Use Topics    Alcohol use: Yes    Drug use: No             Review of Systems   Constitutional:  Positive for fatigue.   Respiratory:  Negative for cough and shortness of breath.    Cardiovascular:  Positive for chest pain.   Gastrointestinal:  Positive for abdominal pain, diarrhea, nausea and vomiting.   Genitourinary:  Negative for dysuria, flank pain and hematuria.       Physical Exam     Triage Vitals  Triage Start: Start, (03/26/23 0138)  First Recorded BP: 136/87, Resp: 16, Temp: 37.2 C (98.9 F), Temp src: Oral Oxygen Therapy SpO2: 93 %, Oximetry Source: Lt Hand, O2 Device: None (Room air), Heart Rate: 82, (03/26/23 0149)  .  First Pain Reported  0-10 Scale: 5, Pain Location/Orientation: Chest;Abdomen, (03/26/23 0149)       Physical Exam  Vitals reviewed.   Constitutional:       Appearance: She is ill-appearing.      Comments: Appears very fatigued   HENT:      Mouth/Throat:      Mouth: Mucous membranes are dry.      Comments: Lips and mucus membranes very dry  Eyes:      Extraocular Movements: Extraocular movements intact.   Cardiovascular:      Rate and Rhythm: Normal rate and regular  rhythm.      Heart sounds: Normal heart sounds.   Pulmonary:      Effort: Pulmonary effort is normal. No respiratory distress.      Breath sounds: Normal breath sounds.   Abdominal:      General: Abdomen is flat. There is no distension.      Palpations: Abdomen is soft.      Tenderness: There is abdominal tenderness. There is no guarding.   Musculoskeletal:      Cervical back: Normal range of motion and neck supple.   Skin:     General: Skin is warm and dry.   Neurological:      General: No focal deficit present.      Mental Status: She is alert and oriented to person, place, and time.   Psychiatric:         Behavior: Behavior normal.         Medical Decision Making   Patient seen by me on:  03/26/2023    Assessment:  67 yo F presents to ED with 4-5 days of nausea, vomiting, diarrhea, chest and abdominal pain; sx started during recent trip to [place]. Patient appears very dry on exam.    Differential diagnosis:  Food borne illness, infectious diarrhea, gastritis, pancreatitis, hepatitis, colitis, anemia, metabolic derangement, viral illness, lower suspicion for ACS, cardiac ischemia, AAA, doubt PE (no tachycardia, hypoxia, leg pain or swelling)    Plan:  Insert IV  Check CBC, BMP, RUQ panel, viral swab, UA  Stool studies, including foreign travel O&P  Troponins  EKG  CXR  Bedside POCUS cardiac and abdominal aorta  IV Morphine for analgesia  IV Zofran for nausea  IV fluid bolus    EKG Interpretation:  Tracing reviewed by myself, normal sinus rhythm, no ischemic changes    Consideration of hospitalization: if unable to tolerate PO or if significant lab/imaging abnormalities    ED Course and Disposition:    CBC normal  BMP normal  RUQ panel normal  Viral swab negative  Bedside US both normal  Troponins  negative and adynamic    CT abd/pelvis:  Impression:       Wall thickening of the gallbladder and mild biliary ductal dilation. Nonspecific finding. Recommend correlation with LFTs, and right upper quadrant  ultrasound can be considered if clinically warranted.    Wall thickening of the bladder which could be due to under distention or cystitis. Recommend correlation with urinary symptoms.    Myomatous uterus. Left adnexal cystic lesion measuring 1.5 cm, recommend follow-up with pelvic ultrasound in 6 months.        Patient continued to receive IV fluids overnight. Ambulating to bathroom with continued diarrhea. Able to tolerate small amounts of PO intake, but still nauseated.  Will give additional meds. May require transfer for admission.    Patient signed out to Dr. Genia Hotter.             Kathryne Hitch, MD            Kathryne Hitch, MD  03/26/23 [phone]

## 2023-03-26 NOTE — ED Notes (Signed)
Provider reviewed test results and diagnosis with pt. Reviewed with pt discharge instructions and follow up care. Pt verbalized understanding, and agrees with discharge plan. Pt's questions addressed. Pt ambulated with steady gait.

## 2023-03-26 NOTE — ED Notes (Signed)
Pt up to BR with steady gait.  Has small semi-formed brown stool. Sample sent to lab.

## 2023-03-26 NOTE — ED Triage Notes (Signed)
Patient presents with generalized upper abdominal pain and pain under bilateral breasts starting around 0000. +lightheadedness and nausea. Pt also states extreme fatigue since Sunday. Patient also states just returned from [place] 7/29. Pt started off with diarrhea and vomiting after return form vacation, which has resolved.    Prehospital medications given: No

## 2023-03-26 NOTE — Discharge Instructions (Addendum)
You were evaluated today for abdominal pain, diarrhea. Labs look reassuring. CT shows concern for colitis. We sent stool studies.     Please:  -Stay hydrated by drinking water. You can get Pedialyte at your local pharmacy  - Take tylenol for pain every 6 hours   - Take Pepcid to help with reflux of the stomach   - Take Zofran as needed for nausea and to prevent vomiting   -Please follow the BRAT diet for the next 48 hours which consist of bananas, rice, applesauce and toast.    Please follow up in the next two days with your primary care provider.     Please return if you have any worsening abdominal pain, vomiting, fevers, or any other concerning symptoms.

## 2023-03-26 NOTE — ED Provider Progress Notes (Signed)
ED Provider Progress Note  Transfer of Care Note    I assumed care of this patient at 7AM on 03/26/23 from Dr. Reynolds Bowl.    Presentation:  Briefly Veronica Bishop is a 67 y.o. female who presents with generalized abdominal pain, malaise, and multiple episodes of nausea/vomiting., diarrhea and decrease PO intake. Patient last night developed chest pain which prompted her to come to the ED. Denies fevers and chills. Recent travel to [place]. States that only her and her daughter got sick at the trip. She has been feeling very weak and fatigue. Presented at sign out pending symptom management and reassessment.     ED Results:  CBC: WNL  BMP: mild elevated glucose at 109 otherwise WNL  RUQ: WNL  0 & 1 hour Troponin: 7, 8 delta of 1  Stool studies: pending  CT abdomen and pelvis: Wall thickening of the gallbladder and mild biliary ductal dilation. Nonspecific finding. Recommend correlation with LFTs, and right upper quadrant ultrasound can be considered if clinically warranted. Wall thickening of the bladder.       ED Course & Disposition:     Patient reevaluated after signout not endorsing with improvement of abdominal pain overall HDS throughout his stay. Patient received Reglan and Pepcid, tolerated well. She had improvement of symptoms, able to tolerate PO. I offer transfer for supportive care and symptom control she and her husband opted to go home as she was feeling better. Instructed to follow up with her PCP in the next following days. Provided a prescription for Reglan, Zofran and Pepcid.Discharge with strict return precautions and instructions to visit her PCP.  All questions and concerns answered prior to discharge.         Fonnie Birkenhead, MD, 03/26/2023, 10:01 PM     Fonnie Birkenhead, MD  03/26/23 [phone]

## 2023-03-27 ENCOUNTER — Encounter: Payer: Self-pay | Admitting: Primary Care

## 2023-03-27 ENCOUNTER — Ambulatory Visit: Payer: Self-pay | Admitting: Primary Care

## 2023-03-27 LAB — ENTAMOEBA HISTOLYTICA PCR: E. histolytica PCR: 0

## 2023-03-27 LAB — SALMONELLA PCR: Salmonella PCR: 0

## 2023-03-27 LAB — CRYPTOSPORIDIUM PCR: Cryptosporidium PCR: 0

## 2023-03-27 LAB — GIARDIA PCR: Giardia PCR: 0

## 2023-03-27 LAB — CAMPYLOBACTER PCR: Campylobacter PCR: 0

## 2023-03-27 LAB — SHIGA TOXIN PCR: Shiga toxin PCR: 0

## 2023-03-27 LAB — SHIGELLA PCR: Shigella PCR: 0

## 2023-03-27 NOTE — Telephone Encounter (Signed)
Reason for Disposition   [1] Drinking very little AND [2] dehydration suspected (e.g., no urine > 12 hours, very dry mouth, very lightheaded)    Answer Assessment - Initial Assessment Questions  1. DIARRHEA SEVERITY: "How bad is the diarrhea?" "How many more stools have you had in the past 24 hours than normal?"     -     Stool every 30 mins for the past 48hrs  2. ONSET: "When did the diarrhea begin?"       Diarrhea x5days  3. BM CONSISTENCY: "How loose or watery is the diarrhea?"       Watery stool  4. VOMITING: "Are you also vomiting?" If Yes, ask: "How many times in the past 24 hours?"       Denies  5. ABDOMEN PAIN: "Are you having any abdomen pain?" If Yes, ask: "What does it feel like?" (e.g., crampy, dull, intermittent, constant)       Cramping on and off x1wk  6. ABDOMEN PAIN SEVERITY: If present, ask: "How bad is the pain?"  (e.g., Scale 1-10; mild, moderate, or severe)       1  7. ORAL INTAKE: If vomiting, "Have you been able to drink liquids?" "How much liquids have you had in the past 24 hours?"      Denies vomitting   8. HYDRATION: "Any signs of dehydration?" (e.g., dry mouth [not just dry lips], too weak to stand, dizziness, new weight loss) "When did you last urinate?"      Patient states she is dehydrated with the increase stools that she is having.  9. EXPOSURE: "Have you traveled to a foreign country recently?" "Have you been exposed to anyone with diarrhea?" "Could you have eaten any food that was spoiled?"      Yes, patient traveled to [place]  10. ANTIBIOTIC USE: "Are you taking antibiotics now or have you taken antibiotics in the past 2 months?"        No  11. OTHER SYMPTOMS: "Do you have any other symptoms?" (e.g., fever, blood in stool)           Nausea x1wk    Protocols used: Diarrhea-A-AH  Writer recommends that patient go to ED now since symptoms worsened in the past 24hrs.  Patient states she just wanted an antibiotic for her symtpoms. Since symptoms has increased in the past  24hrs since being discharged from ED. Writer advised patient to return to ED.  Patient states at end of conversation that she may not go to ED for further care.
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
## 2023-03-29 LAB — O&P MICROSCOPY-FOREIGN TRAVEL: O&P Microscopy-Foreign Travel: 0

## 2023-03-29 LAB — AEROBIC CULTURE

## 2023-03-29 LAB — O&P TRICHROME STAIN: O&P Trichrome Stain: 0

## 2023-03-30 ENCOUNTER — Ambulatory Visit: Payer: Medicare (Managed Care) | Admitting: Primary Care

## 2023-03-31 ENCOUNTER — Encounter: Payer: Self-pay | Admitting: Primary Care

## 2023-03-31 ENCOUNTER — Encounter: Payer: Self-pay | Admitting: Gastroenterology

## 2023-03-31 ENCOUNTER — Ambulatory Visit: Payer: Medicare (Managed Care) | Admitting: Primary Care

## 2023-03-31 ENCOUNTER — Other Ambulatory Visit: Payer: Self-pay

## 2023-03-31 VITALS — BP 128/72 | HR 74 | Ht 61.0 in | Wt 189.0 lb

## 2023-03-31 DIAGNOSIS — E785 Hyperlipidemia, unspecified: Secondary | ICD-10-CM

## 2023-03-31 DIAGNOSIS — N390 Urinary tract infection, site not specified: Secondary | ICD-10-CM

## 2023-03-31 DIAGNOSIS — Z0000 Encounter for general adult medical examination without abnormal findings: Secondary | ICD-10-CM

## 2023-03-31 DIAGNOSIS — Z23 Encounter for immunization: Secondary | ICD-10-CM

## 2023-03-31 DIAGNOSIS — Z780 Asymptomatic menopausal state: Secondary | ICD-10-CM

## 2023-03-31 MED ORDER — PNEUMOCOCCAL 20-VAL CONJ VACC 0.5 ML IM SUSY *I*
0.5000 mL | PREFILLED_SYRINGE | Freq: Once | INTRAMUSCULAR | 0 refills | Status: AC
Start: 2023-03-31 — End: 2023-03-31

## 2023-03-31 NOTE — Patient Instructions (Addendum)
Did you know, self check-in is quick, easy and protects your privacy? We encourage you, before your next visit with [place], to check in from home through your MyChart app and let us know that you have arrived to your appointment on our Welcome Kiosk located in the main lobby. If you don't have a MyChart account, you can check in on our Welcome Kiosk upon arrival to the office. This enhanced check-in option makes your check-in and rooming experience faster, more efficient and offers you more privacy. And it will give you more time to spend with your provider. Our staff will always be available to help!          Please call the imaging office to schedule your DEXA Scan as soon at possible.    [facility]: [phone]  [facility]: [phone]  [facility]: [phone]  [facility]: [phone] or [phone]  [facility]: [phone]  [facility]: [phone]  [facility]: [phone]  [facility]: [phone]    If going outside of the [facility] please let us know as the other offices require [place] to manually fax over the order requisition.    Thank you for completing your Welcome to Medicare visit and Annual Exam   with [place] today.     The purpose of this visits was to:    Screen for disease  Assess risk of future medical problems  Help develop a healthy lifestyle  Update vaccines  Get to know your doctor in case of an illness    Patient Care Team:  Karen Kays, MD as PCP - General  [facility] [employer] [facility] (Gastroenterology)     Medicare 5 Year Plan    The following items were identified as areas of concern during your screening today:  BMI greater than 25 - This is a risk for Heart Attack, Stroke, High Blood Pressure, Diabetes, High Cholesterol and other complications.       The Health Maintenance table below identifies screening tests and immunizations recommended by your health care team:  Health Maintenance: These  screening recommendations are based on USPSTF, [employer], and [place] state guidelines   Topic Date Due   . HIV Screening  Never done   . Osteoporosis Screening  Never done   . Hepatitis B Vaccine (1 of 3 - Risk 3-dose series) Never done   . COVID-19 Vaccine (4 - 2023-24 season) 04/24/2022   . Cervical Cancer Screening  08/29/2022   . Flu Shot (1) 04/25/2023   . Depression Screen Yearly  03/30/2024   . Fall Risk Screening  03/30/2024   . Breast Cancer Screening  06/15/2024   . Colon Cancer Screening  11/15/2029   . DTaP/Tdap/Td Vaccines (3 - Td or Tdap) 03/30/2033   . Shingles Vaccine  Completed   . Hepatitis C Screening  Completed   . Pneumococcal Vaccination  Completed   . HIB Vaccine  Aged Out   . HPV Vaccine  Aged Out   . Meningococcal Vaccine  Aged Out   . Rotavirus Vaccine  Aged Out     In addition, goals and orders placed to address these recommendations are listed in the "Today's Visit" section.    We wish you the best of health and look forward to seeing you again next year for your Annual Medicare Wellness Visit.     If you have any health care concerns before then, please do not hesitate to contact us.

## 2023-04-04 ENCOUNTER — Encounter: Payer: Self-pay | Admitting: Medical

## 2023-04-05 ENCOUNTER — Ambulatory Visit: Payer: Medicare (Managed Care) | Attending: Primary Care | Admitting: Medical

## 2023-04-05 DIAGNOSIS — N949 Unspecified condition associated with female genital organs and menstrual cycle: Secondary | ICD-10-CM

## 2023-04-05 DIAGNOSIS — R112 Nausea with vomiting, unspecified: Secondary | ICD-10-CM

## 2023-04-05 DIAGNOSIS — R197 Diarrhea, unspecified: Secondary | ICD-10-CM

## 2023-04-05 DIAGNOSIS — K828 Other specified diseases of gallbladder: Secondary | ICD-10-CM

## 2023-04-05 NOTE — Progress Notes (Unsigned)
[facility] [place]: Outpatient Follow-up Progress Note    Video Visit   Location of Patient: home  Location of Telemedicine Provider: hospital / clinical location  Other participants in telemedicine encounter and roles:  Not applicable  This is an established patient visit.  Reason for visit: diarrhea  Patient's problem list, allergies, and medications were reviewed and updated as appropriate. Please see the EHR for full details.  Consent was obtained from the patient to complete this video visit; including the potential for financial liability.    Chief Complaint   Patient presents with    Other     diarrhea     SUBJECTIVE    Veronica Bishop  is a 67 y.o. female  here for a follow-up visit. Today we discussed:    Patient was seen at [facility] ED on 8/2 for GI symptoms of nausea, vomiting and diarrhea.  Labs checked and were essentially unrevealing, CT abdomen/pelvis with incidental findings listed below, but no acute abnormality to explain patient's symptoms.  She had recent travel to the [place] and suspected she had simple colitis/gastroenteritis.  She was treated supportively with IV fluids and antiemetics.  Provided prescription for Reglan, Zofran and Pepcid.  Subsequent bacteria/parasitic stool testing was performed which was all negative (see detailed results in chart).     Today she reports to fluctuating symptoms. She returned home from trip to [place] on 7/29 and shortly afterward developed the nausea/vomiting/diarrhea. Reported multiple episodes of liquid stool for 2-3 days. She notes her daughter had similar GI symptoms and was given a Zpack for traveler's diarrhea.    After the ED visit she started taking imodium which did improve the diarrhea. Then she went ~1 week with no bowel movement, but admits her appetite has still been diminished so she has been ingesting less food. She does endorse keeping up with hydration via water and pedialyte drinks to replenish  electrolytes.   Yesterday she had diarrhea symptoms again and reports it was frequent liquid stools again. Nausea/vomiting has mostly resolved.   Today she did pass a soft BM that was more formed. Her stomach still feels "off" but she denies overt abdominal pain, fever, chills, sweats, dizziness, hematochezia or melena.   She still has some GERD-like symptoms here and there including heartburn, belching and indigestion.       CT Abdomen/Pelvis 03/26/2023 IMPRESSION:  Impression     Wall thickening of the gallbladder and mild biliary ductal dilation. Nonspecific finding. Correlation can be made with LFTs, and right upper quadrant ultrasound can be considered if clinically warranted.    Wall thickening of the bladder which could be due to underdistention or cystitis.    Left adnexal cystic lesion measuring 1.5 cm, recommend follow-up with pelvic ultrasound in 6 months.     END OF IMPRESSION       The patient's medications were reviewed and reconciled with the patient.   The patient's allergies were reviewed and confirmed with the patient.  The patient's past medical, surgical, social and/or family histories were reviewed and updated in the appropriate sections of eRecord.      REVIEW OF SYSTEMS  ROS: As above      OBJECTIVE This visit was performed during a pandemic event. The limited physical exam based on observations via phone are as follows:  There were no vitals filed for this visit.  Constitutional: WD/WN, in NAD.  HEENT: Normocephalic and atraumatic.   Lungs: Pulmonary effort is normal.  Patient is speaking in full sentences without any apparent distress.  Psych: Normal mood, congruent affect, normal eye contact. Speech is articulate and fluid. No signs of anxiety.      ASSESSMENT & PLAN    1. Nausea vomiting and diarrhea  C diff toxin B NAAT(PCR)-Toxin EIA if positive    H. pylori antigen, stool (Stool)    US abdominal complete      2. Thickening of wall of gallbladder  US abdominal complete      3. Adnexal cyst   US pelvic complete with transvaginal complete        1. Nausea vomiting and diarrhea  Ddx still includes acute gastroenteritis, possible from recent travel to [place]. O&P stool testing performed thus far has been negative.  No obvious acute findings on CT scan to explain symptoms; only incidental findings, to be followed up as detailed below.  Recommend clear liquid diet, and advance as tolerated to bland/BRAT diet. Initially, should be low residue/low fiber.   If her diarrhea returns, I advised we should complete stool testing for C. Diff as well as H. Pylori antigen (due to upper GI symptoms).  Otherwise continue supportive care/OTC anti-emetics and imodium as needed.  - C diff toxin B NAAT(PCR)-Toxin EIA if positive; Future  - H. pylori antigen, stool (Stool); Future  - US abdominal complete; Future    2. Thickening of wall of gallbladder  Seen on CT scan; no obvious signs of cholelithiasis. Consider biliary dyskinesia. She does endorse some epigastric symptoms/GERD and nausea unrelated to her recent acute diarrheal symptoms.  I recommend complete abdominal US to rule out biliary pathology. Also to visualize bladder (due to wall thickening) and will tack on pelvic US for surveillance of adnexal cyst.   - US abdominal complete; Future    3. Adnexal cyst  Seen incidentally on recent CT scan. Recommend [place] follow up which she can complete while she obtains the abdominal US.  - US pelvic complete with transvaginal complete; Future        The following patient instructions were reviewed verbally with the patient, printed on their after visit summary and given to them at the end of today's visit.     Patient Instructions   Did you know, self check-in is quick, easy and protects your privacy? We encourage you, before your next visit with [place], to check in from home through your MyChart app and let us know that you have arrived to your appointment on our Welcome Kiosk located in the main lobby. If you don't have a MyChart  account, you can check in on our Welcome Kiosk upon arrival to the office. This enhanced check-in option makes your check-in and rooming experience faster, more efficient and offers you more privacy. And it will give you more time to spend with your provider. Our staff will always be available to help!        Please call the imaging office to schedule your US pelvic complete with transvaginal complete and US abdominal complete as soon at possible.    [facility]: [phone]  [facility]: [phone]  [facility]: [phone]  [facility]: [phone] or [phone]  [facility]: [phone]  [facility]: [phone]  [facility]: [phone]  [facility]: [phone]    If going outside of the [facility] please let us know as the other offices require [place] to manually fax over the order requisition.      Follow up is scheduled  as follows:  Future Appointments   Date Time Provider Department Center   10/01/2023  8:30 AM Diloreto, Onalee Hua, MD [facility] None      Patient was also instructed to call office or send MyChart for any questions or concerns and to return to the office sooner than next scheduled appointment if needed.    Christen Butter, PA-C  Family Medicine    This note has been dictated using Animal nutritionist.  Reasonable attempts to correct typing mistakes have been done.  Please excuse any inherent inaccuracy that might have escaped.      Documentation for Time-Based Billing:  I personally spent 49 minutes on 04/05/2023 and this included a combination of pre- and post-visit work, seeing and caring for the patient in the office, completing the note, updating the chart and/or communicating with other care team members.     Pre-visit  Preparing to see patient, review of tests  Independently reviewing results and communicating results to patient/caregiver Visit  Obtaining/reviewing separate history  Performing exam &  evaluation  Counseling/educating patient and caregiver(s) Post-visit  Ordering medications, tests  Documentation in EMR  Referring or communicating with other providers  Care coordination

## 2023-04-05 NOTE — Patient Instructions (Addendum)
Did you know, self check-in is quick, easy and protects your privacy? We encourage you, before your next visit with [place], to check in from home through your MyChart app and let us know that you have arrived to your appointment on our Welcome Kiosk located in the main lobby. If you don't have a MyChart account, you can check in on our Welcome Kiosk upon arrival to the office. This enhanced check-in option makes your check-in and rooming experience faster, more efficient and offers you more privacy. And it will give you more time to spend with your provider. Our staff will always be available to help!        Please call the imaging office to schedule your US pelvic complete with transvaginal complete and US abdominal complete as soon at possible.    [facility]: [phone]  [facility]: [phone]  [facility]: [phone]  [facility]: [phone] or [phone]  [facility]: [phone]  [facility]: [phone]  [facility]: [phone]  [facility]: [phone]    If going outside of the [facility] please let us know as the other offices require [place] to manually fax over the order requisition.

## 2023-04-06 ENCOUNTER — Other Ambulatory Visit
Admission: RE | Admit: 2023-04-06 | Discharge: 2023-04-06 | Disposition: A | Discharge status: Home / Self Care | Payer: Medicare (Managed Care) | Source: Ambulatory Visit | Attending: Medical | Admitting: Medical

## 2023-04-06 ENCOUNTER — Telehealth: Payer: Self-pay | Admitting: Primary Care

## 2023-04-06 DIAGNOSIS — R112 Nausea with vomiting, unspecified: Secondary | ICD-10-CM | POA: Insufficient documentation

## 2023-04-06 DIAGNOSIS — R197 Diarrhea, unspecified: Secondary | ICD-10-CM | POA: Insufficient documentation

## 2023-04-06 LAB — C DIFF TOXIN B NAAT (PCR)-TOXIN EIA IF POSITIVE: C diff toxin B NAAT (PCR) - Toxin EIA if positive: NEGATIVE

## 2023-04-06 LAB — H. PYLORI ANTIGEN, STOOL: H. pylori Stool Antigen EIA: 0

## 2023-04-06 NOTE — Telephone Encounter (Signed)
Patient called office to call about her recent instructions for her labs. Pt unclear about her h pylori test. RN explained both the c diff   testing and h Pylori are both stool samples, ordered by PA [place] via telemedicine visit 04/05/2023.   RN explained if her stool  is formed, the c diff sample will not be performed.   Pt asked to be put on Azithromycin for her severe diarrhea. ( Pt reported daughter was treated with Z pack and it  helped her diarrhea)  RN educated patient that this  might be denied due to not knowing the exact source that could be causing diarrhea.  Pt appreciative of call. Pt understands to deposit stool sample today.

## 2023-04-06 NOTE — Telephone Encounter (Signed)
Writer called patient verified DOB .  Patient aware of message below.

## 2023-04-06 NOTE — Telephone Encounter (Signed)
Antibiotics are NOT appropriate for diarrhea.  Can make things very much worse.

## 2023-04-07 ENCOUNTER — Encounter: Payer: Self-pay | Admitting: Medical

## 2023-04-08 ENCOUNTER — Other Ambulatory Visit: Payer: Self-pay | Admitting: Primary Care

## 2023-04-08 MED ORDER — TOLTERODINE TARTRATE 4 MG PO CP24 *I*
4.0000 mg | ORAL_CAPSULE | Freq: Every day | ORAL | 2 refills | Status: DC
Start: 2023-04-08 — End: 2024-02-11

## 2023-04-08 NOTE — Telephone Encounter (Signed)
Patient comment: At physical on 8/7 Dr Erling Cruz was going to refill 3-90 day refills. Please call this into my pharmacy.     Quantity updated.     Last office visit with doctor:   03/31/2023  Last office visit with APP:   Visit date not found  Patients upcoming appointments:  Future Appointments   Date Time Provider Department Center   10/01/2023  8:30 AM Diloreto, Onalee Hua, MD [facility] None     Recent Lab results:  GENERAL CHEMISTRY   Recent Labs     03/26/23  0153   NA 141   K 3.6   CL 104   CO2 26   GAP 11   UN 11   CREAT 0.77   GLU 109*   CA 9.2      LIPID PROFILE   No value within the past 365 days   LIVER PROFILE   Recent Labs     03/26/23  0153   ALT 19   AST 20   ALK 83   TB 0.5      DIABETES THYROID   No value within the past 365 days No value within the past 365 days      Pending/Orders Labs:  Lab Frequency Next Occurrence   Basic Metabolic Panel Once 03/25/2023   Hemoglobin A1c Once 03/25/2023   Lipid Panel (Reflex to Direct  LDL if Triglycerides more than 400) Once 03/25/2023   Vitamin D Once 03/25/2023   DEXA Scan Once 03/31/2023   US abdominal complete Once 04/06/2023   US pelvic complete with transvaginal complete Once 04/05/2023        .pcn

## 2023-04-09 ENCOUNTER — Other Ambulatory Visit
Admission: RE | Admit: 2023-04-09 | Discharge: 2023-04-09 | Disposition: A | Discharge status: Home / Self Care | Payer: Medicare (Managed Care) | Source: Ambulatory Visit | Attending: Primary Care | Admitting: Primary Care

## 2023-04-09 DIAGNOSIS — E559 Vitamin D deficiency, unspecified: Secondary | ICD-10-CM | POA: Insufficient documentation

## 2023-04-09 DIAGNOSIS — Z136 Encounter for screening for cardiovascular disorders: Secondary | ICD-10-CM

## 2023-04-09 LAB — BASIC METABOLIC PANEL
Anion Gap: 11 (ref 7–16)
CO2: 28 mmol/L (ref 20–28)
Calcium: 9.9 mg/dL (ref 8.6–10.2)
Chloride: 101 mmol/L (ref 96–108)
Creatinine: 0.76 mg/dL (ref 0.51–0.95)
Glucose: 95 mg/dL (ref 60–99)
Lab: 11 mg/dL (ref 6–20)
Potassium: 4.2 mmol/L (ref 3.3–5.1)
Sodium: 140 mmol/L (ref 133–145)
eGFR BY CREAT: 86 *

## 2023-04-09 LAB — LIPID PANEL
Chol/HDL Ratio: 5
Cholesterol: 206 mg/dL — AB
HDL: 41 mg/dL (ref 40–60)
LDL Calculated: 130 mg/dL — AB
Non HDL Cholesterol: 165 mg/dL
Triglycerides: 174 mg/dL — AB

## 2023-04-09 LAB — VITAMIN D: 25-OH Vit Total: 37 ng/mL (ref 30–60)

## 2023-04-11 LAB — HEMOGLOBIN A1C: Hemoglobin A1C: 5.8 % — ABNORMAL HIGH

## 2023-05-06 ENCOUNTER — Ambulatory Visit
Admission: RE | Admit: 2023-05-06 | Discharge: 2023-05-06 | Disposition: A | Discharge status: Home / Self Care | Payer: Medicare (Managed Care) | Source: Ambulatory Visit | Attending: Primary Care | Admitting: Primary Care

## 2023-05-06 ENCOUNTER — Other Ambulatory Visit: Payer: Self-pay | Admitting: Primary Care

## 2023-05-06 ENCOUNTER — Other Ambulatory Visit: Payer: Self-pay

## 2023-05-06 DIAGNOSIS — Z780 Asymptomatic menopausal state: Secondary | ICD-10-CM

## 2023-05-06 DIAGNOSIS — Z23 Encounter for immunization: Secondary | ICD-10-CM

## 2023-05-06 DIAGNOSIS — N390 Urinary tract infection, site not specified: Secondary | ICD-10-CM

## 2023-05-06 DIAGNOSIS — Z0000 Encounter for general adult medical examination without abnormal findings: Secondary | ICD-10-CM

## 2023-05-06 DIAGNOSIS — E785 Hyperlipidemia, unspecified: Secondary | ICD-10-CM

## 2023-05-10 ENCOUNTER — Ambulatory Visit
Admission: RE | Admit: 2023-05-10 | Discharge: 2023-05-10 | Disposition: A | Discharge status: Home / Self Care | Payer: Medicare (Managed Care) | Source: Ambulatory Visit | Attending: Medical | Admitting: Medical

## 2023-05-10 ENCOUNTER — Other Ambulatory Visit: Payer: Medicare (Managed Care)

## 2023-05-10 ENCOUNTER — Other Ambulatory Visit: Payer: Self-pay

## 2023-05-10 DIAGNOSIS — D259 Leiomyoma of uterus, unspecified: Secondary | ICD-10-CM

## 2023-05-10 DIAGNOSIS — N949 Unspecified condition associated with female genital organs and menstrual cycle: Secondary | ICD-10-CM | POA: Insufficient documentation

## 2023-05-14 ENCOUNTER — Ambulatory Visit: Payer: Medicare (Managed Care)

## 2023-05-17 ENCOUNTER — Telehealth: Payer: Self-pay | Admitting: Medical

## 2023-05-17 DIAGNOSIS — D259 Leiomyoma of uterus, unspecified: Secondary | ICD-10-CM

## 2023-05-17 DIAGNOSIS — R9389 Abnormal findings on diagnostic imaging of other specified body structures: Secondary | ICD-10-CM

## 2023-05-17 NOTE — Telephone Encounter (Signed)
Called and spoke to patient, DOB confirmed, to review recent pelvic US results:      Uterine fibroids.   UTERUS:   Size: 5.8 cm AP x 5.9 cm transverse x 8.9 cm in length   Version:  Anteverted   Endometrial Stripe:  0.9 cm Thickened. Cystic area visualized within the endometrium measuring approximately 0.5x0.3x0.3 cm.   Uterine Fibroids:    Fibroid 1:  4.0 cm AP x 4.0 cm transverse x 3.0 cm in length   Details:  Fundal, heterogeneous.   Fibroid 2:  0.8 cm AP x 0.7 cm transverse x 0.6 cm in length   Details:  Body, heterogeneous.   Fibroid 3:  0.8 cm AP x 0.9 cm transverse x 0.6 cm in length   Details:  Body, heterogeneous.   Uterus Comments:  Heterogeneous myometrium.      Nonvisualized right ovary.      Unremarkable left ovary.   LEFT OVARY:   Size: 2.5 cm AP x 1.5 cm transverse x 1.7 cm in length.   Visualized transvaginally only. ?   Follicles visualized within the left ovary measuring approximately 1.4x1.4x1.2 cm and 0.7x0.7x0.9 cm.   Doppler: Arterial flow documented.  Venous attempted but not seen.          END OF IMPRESSION       Discussed findings above. I recommend GYN consult to review the thickened endometrium and large uterine fibroids to determine if patient needs surveillance monitoring or consult for further intervention. Patient is agreeable. Referral placed.

## 2023-05-25 ENCOUNTER — Other Ambulatory Visit: Payer: Self-pay

## 2023-05-25 DIAGNOSIS — D259 Leiomyoma of uterus, unspecified: Secondary | ICD-10-CM | POA: Insufficient documentation

## 2023-06-04 ENCOUNTER — Ambulatory Visit
Admission: RE | Admit: 2023-06-04 | Discharge: 2023-06-04 | Disposition: A | Discharge status: Home / Self Care | Payer: Medicare (Managed Care) | Source: Ambulatory Visit | Attending: Medical | Admitting: Medical

## 2023-06-04 ENCOUNTER — Other Ambulatory Visit: Payer: Self-pay

## 2023-06-04 ENCOUNTER — Ambulatory Visit: Payer: Medicare (Managed Care) | Admitting: Obstetrics and Gynecology

## 2023-06-04 ENCOUNTER — Encounter: Payer: Self-pay | Admitting: Obstetrics and Gynecology

## 2023-06-04 VITALS — BP 130/82 | Ht 61.25 in | Wt 190.0 lb

## 2023-06-04 DIAGNOSIS — R197 Diarrhea, unspecified: Secondary | ICD-10-CM | POA: Insufficient documentation

## 2023-06-04 DIAGNOSIS — K769 Liver disease, unspecified: Secondary | ICD-10-CM | POA: Insufficient documentation

## 2023-06-04 DIAGNOSIS — D259 Leiomyoma of uterus, unspecified: Secondary | ICD-10-CM

## 2023-06-04 DIAGNOSIS — K828 Other specified diseases of gallbladder: Secondary | ICD-10-CM | POA: Insufficient documentation

## 2023-06-04 DIAGNOSIS — R9389 Abnormal findings on diagnostic imaging of other specified body structures: Secondary | ICD-10-CM | POA: Insufficient documentation

## 2023-06-04 DIAGNOSIS — N289 Disorder of kidney and ureter, unspecified: Secondary | ICD-10-CM | POA: Insufficient documentation

## 2023-06-04 DIAGNOSIS — R112 Nausea with vomiting, unspecified: Secondary | ICD-10-CM | POA: Insufficient documentation

## 2023-06-04 NOTE — Progress Notes (Signed)
[facility] Gynecology Problem Visit    Subjective:    Veronica Bishop is a 67 y.o. [id number] who presents for consult, referred by her pcp due to thickened cystic endo lining found on [place] and fibroid uterus.    No PMB    Medical History  Patient's medications, allergies, past medical, surgical, social and family histories were reviewed and updated as appropriate.      Objective:    Physical Exam  BP 130/82 (BP Location: Left arm, Patient Position: Sitting, Cuff Size: adult)   Ht 1.556 m (5' 1.25")   Wt 86.2 kg (190 lb)   LMP 06/01/2013     Well developed and well-nourished.  A&O x3, no acute distress, appropriate mood and affect.  Unlabored respiratory effort.  External genitalia are without abnormalities.  Vagina well rugated, pink, no lesions or redness.  Cervix is without discharge, lesions or friability.  See procedure note for endo bx      Assessment/Plan:    1. Increased endometrial stripe thickness  Recommend endo bx due to thickened cystic endo lining  - Surgical Pathology; Future  - Surgical Pathology    2. Uterine leiomyoma  Ovarian cyst seen on ct scan resolved  Discussed 3 small fibroids

## 2023-06-11 ENCOUNTER — Encounter: Payer: Self-pay | Admitting: Obstetrics and Gynecology

## 2023-06-11 LAB — SURGICAL PATHOLOGY

## 2023-06-11 NOTE — Telephone Encounter (Signed)
Pt called, informed biopsy results are taking about 2 weeks

## 2023-06-14 ENCOUNTER — Telehealth: Payer: Self-pay | Admitting: Medical

## 2023-06-14 NOTE — Telephone Encounter (Signed)
Called and spoke to patient to relay results of abdominal ultrasound; reviewed incidental and likely benign findings of gallbladder/liver.  No further clinical concern; will continue to monitor.  Patient verbalizes understanding of message and was appreciative of call.

## 2023-06-15 ENCOUNTER — Telehealth: Payer: Self-pay

## 2023-06-15 ENCOUNTER — Telehealth: Payer: Self-pay | Admitting: Obstetrics and Gynecology

## 2023-06-15 NOTE — Telephone Encounter (Signed)
OBGYN SURGICAL ADMISSION FORM  [facility]  Date submitted: 06/15/2023       Surgeon-     Bard Herbert, MD    Admission type:  ASC (outpatient surgery)    Admitting Diagnosis:  Thickened endometrium   R93.8    Procedure (s):  Hysteroscopy with D&C    [phone]    Special equipment:   None       Types of anesthesia:  Monitored Anesthesia (MAC)    Estimated length of case:      [facility]  1.5 hrs    [facility] / [facility] 30 mins    Surgery location:  [facility],  [facility],  [place],     To [place] or Not to [facility] To Void Or Not to Void: Does not NEED to void to go home      First Assist-      Resident  Partner-     NO  Date of surgery / patient wants:   Call Patient    ASA class:     ASA 2 - Patient with mild systemic disease with no functional limitations  Procedure Urgency:   Semi-Urgent Procedure        DOB:  02-27-1956    AGE: 66 y.o.    Phone:  [phone] (home) There is no work phone number on file.  [phone] (mobile)  Address:  [address]  [place] [address]   Allergies: Sulfa antibiotics, Sodium lauryl sulfate, and No known latex allergy    BMI: There is no height or weight on file to calculate BMI.    Latex Allergic-  [x]  NO   []  YES       Diabetes-    [x]  NO   []  YES       Contact (if pt is a minor or living in another facility):   Insurance company: Attending: NPI, Facet ID, Tax ID   Policy #: Percert #:   2nd insurance company:  Policy #: Precert #:   Preferred language: English  My Chart Status: Activated [1]     Does she need admit day prior to surgery?  NO  If "YES" Medical justification :      Office preop with MD: YES    Preop testing to be done on / at: prior to arrival DOS    Medical Clearance Needed?  [x]  No  []  Yes   If needed- Please specify reason:    Pre- Anesthesia Needed?   [x]  No  []  Yes   If needed- Please specify reason:    Office appt w/ MD: 4 week(s) postop     Expected return to work: immediately    Does procedure include hysterectomy or sterilization?   [x]  No  []  Yes  Medicaid Insurance?        [x]  No  []  Yes    If answers to both questions are yes:  Hysterectomy: requires [facility] [id number] or [id number].   Sterilization: requires [facility] [id number] or [id number] signed 30 days before surgery.       Preadmission testing:    Pre-op Labwork: UPT on Day of Surgery

## 2023-06-15 NOTE — Telephone Encounter (Signed)
-----   Message from Bard Herbert, MD sent at 06/15/2023 10:53 AM EDT -----  Please call pt- I was not able to get enough of a sample on endo bx- recommend we do a hysteroscopy D&C under anesthesia at [facility], HH or [facility] to get a better sample  I sent a surgical request to our schedulers and they should reach out her  I will see her for a preop prior to the procedure

## 2023-06-15 NOTE — Telephone Encounter (Signed)
Pt aware.

## 2023-06-18 ENCOUNTER — Telehealth: Payer: Self-pay

## 2023-06-18 NOTE — Telephone Encounter (Signed)
[facility]  Patient Advised   Surgery Date 07/29/23  9:15   [facility]  Pre surgical screening  nurse to call patient for Medical hx review, if appointment needed they will schedule   [facility] center will call you day before between 2-5 day before with arrival time   NPO   Pre op w/ doctor  07/07/23  12:00 [place]     To [place] team

## 2023-06-18 NOTE — Telephone Encounter (Signed)
Date of Procedure 07/29/23  Outpatient   Provider: Dr. Jacqulyn Bath  NPI [id number]  TAX ID [id number]    Location [facility]  NPI [id number]  TAX ID [id number]         CPT Codes: (w/description)   [phone]  Hysteroscopy, D & C         ICD-10 Codes: (w/Description)   R93.89   Thickened Endometrium       Insurance: [employer] ID #: [id number]

## 2023-06-21 NOTE — Telephone Encounter (Signed)
No PA req for outpatient cpt [id number] via provider portal     Procedures are subject to review and this is not a guarantee of payment. It is the patients' responsability to verify that CPT codes that do not require a PA are deemed mediacally necessary per their policy.

## 2023-06-30 ENCOUNTER — Telehealth: Payer: Self-pay | Admitting: Primary Care

## 2023-06-30 NOTE — Telephone Encounter (Signed)
Patient called state that she had a Biopsy through the [facility] and she wanted to now if she could schedule a zoom apptioinmnet to talk to you Duwayne Heck  to get a second option before she goes back to her apptioinmnet. Patient wanted to know if you could talk to her this week. Writer did try to schedule her for Nov 14 but she refused wants to be seen early.

## 2023-06-30 NOTE — Telephone Encounter (Signed)
Patient called office and is now scheduled with PA at 4:00 pm tomorrow for a Zoom appointment.

## 2023-06-30 NOTE — Telephone Encounter (Signed)
Writer LVM for patient to return call to office

## 2023-06-30 NOTE — Telephone Encounter (Signed)
Can she do video visit with me in one of my slots at end of the day this week?

## 2023-07-01 ENCOUNTER — Ambulatory Visit: Payer: Medicare (Managed Care) | Attending: Primary Care | Admitting: Medical

## 2023-07-01 ENCOUNTER — Other Ambulatory Visit: Payer: Self-pay

## 2023-07-01 VITALS — Wt 187.0 lb

## 2023-07-01 DIAGNOSIS — R9389 Abnormal findings on diagnostic imaging of other specified body structures: Secondary | ICD-10-CM

## 2023-07-01 NOTE — Progress Notes (Signed)
[facility] [place]: Outpatient Follow-up Progress Note    Video Visit   Location of Patient: home  Location of Telemedicine Provider: hospital / clinical location  Other participants in telemedicine encounter and roles:  Not applicable  This is an established patient visit.  Reason for visit: biopsy results  Patient's problem list, allergies, and medications were reviewed and updated as appropriate. Please see the EHR for full details.  Consent was obtained from the patient to complete this video visit; including the potential for financial liability.    Chief Complaint   Patient presents with    Follow-up     Discuss biopsy results     SUBJECTIVE    Veronica Bishop  is a 67 y.o. female  here for a follow-up visit. Today we discussed:    Patient is here to discuss recent biopsy results from endometrial biopsy done on 06/04/2023.  Recall she had incidental 0.9 cm thickened endometrium on pelvic US from 05/10/2023, along with cystic area visualized within the endometrium measuring approximately 0.5 x 0.3 x 0.3 cm.  Also showed fibroid uterus.  She was referred to GYN who performed endometrial biopsy as indicated, pathology revealed:  - Fragments of superficial endometrioid mucosa with atrophic features  - Fragments of squamous and endocervical mucosa without significant histopathologic change  - There are fragments of superficial endometrioid glands without atypia.  No stroma is present, and the specimen is limited for diagnostic evaluation of the endometrium.  The specimen may not be representative of the clinical concern.  If clinical concern persists, consideration for additional tissue is suggested.    Because there was not enough cells able to be obtained on the endometrial biopsy, her GYN is recommending hysteroscopy D&C under anesthesia for a better sample.  She is scheduled to have this procedure on 07/29/2023.    She remains asymptomatic from a gynecologic standpoint.   Patient is hesitant  about moving forward with hysteroscopy, and inquires about getting a 2nd opinion on whether an additional biopsy is indicated, vs surveillance monitoring with repeat US in short interval to re-measure the endometrial thickening.   She is requesting another GYN referral for second opinion.       The patient's medications were reviewed and reconciled with the patient.   The patient's allergies were reviewed and confirmed with the patient.  The patient's past medical, surgical, social and/or family histories were reviewed and updated in the appropriate sections of eRecord.    REVIEW OF SYSTEMS  ROS: As above      OBJECTIVE Patient was made aware that due to the nature of the video visit we would be unable to do a complete physical exam, therefore possibly delaying diagnosis. Patient was informed that physical exam would be deferred to next in office visit and additions to the plan could be made at that time based on findings. The patient agreed and consented to the plan.   The limited physical exam based on observations via phone are as follows:    Vitals:    07/01/23 1146   Weight: 84.8 kg (187 lb)     Constitutional: WD/WN, in NAD.  HEENT: Normocephalic and atraumatic.   Lungs: Pulmonary effort is normal.  Patient is speaking in full sentences without any apparent distress.  Psych: Normal mood, congruent affect, normal eye contact. Speech is articulate and fluid. No signs of anxiety.      ASSESSMENT & PLAN    1. Endometrial thickening on ultrasound  AMB REFERRAL  [facility] [place] [facility]        1. Endometrial thickening on ultrasound (Primary)  We reviewed the biopsy results and limited specimen interpretation.  I did explain that hysteroscopy + D&C is a more thorough diagnostic evaluation in order to obtain a more focused sample for pathologic testing.  She does not wish to continue care with the office she was seeing, and is requesting a second opinion.  I will place referral to new GYN practice,  Dr. Cleon Dew, to discuss benefits/risks and indications for further procedural testing versus surveillance monitoring with repeat pelvic US in future.  - AMB REFERRAL TO [facility] [place] [facility]      The following patient instructions were reviewed verbally with the patient, printed on their after visit summary and given to them at the end of today's visit.     Patient Instructions   Did you know, self check-in is quick, easy and protects your privacy? We encourage you, before your next visit with [place], to check in from home through your MyChart app and let us know that you have arrived to your appointment on our Welcome Kiosk located in the main lobby. If you don't have a MyChart account, you can check in on our Welcome Kiosk upon arrival to the office. This enhanced check-in option makes your check-in and rooming experience faster, more efficient and offers you more privacy. And it will give you more time to spend with your provider. Our staff will always be available to help!    Follow up is scheduled as follows:  Future Appointments         Provider Department Center    07/07/2023 12:00 PM Bard Herbert, MD [facility] [place]    10/01/2023 8:30 AM Johnnette Litter, Onalee Hua, MD [facility]            Patient was also instructed to call office or send MyChart for any questions or concerns and to return to the office sooner than next scheduled appointment if needed.      Christen Butter, PA-C  Family Medicine    This note has been dictated using Animal nutritionist.  Reasonable attempts to correct typing mistakes have been done.  Please excuse any inherent inaccuracy that might have escaped.

## 2023-07-01 NOTE — Patient Instructions (Signed)
Did you know, self check-in is quick, easy and protects your privacy? We encourage you, before your next visit with us, to check in from home through your MyChart app and let us know that you have arrived to your appointment on our Welcome Kiosk located in the main lobby. If you don't have a MyChart account, you can check in on our Welcome Kiosk upon arrival to the office. This enhanced check-in option makes your check-in and rooming experience faster, more efficient and offers you more privacy. And it will give you more time to spend with your provider. Our staff will always be available to help!

## 2023-07-02 ENCOUNTER — Telehealth: Payer: Self-pay

## 2023-07-02 NOTE — Telephone Encounter (Signed)
Patient calling to cancel pre op &  07/29/23 surgery;     She would like a 2nd opinion ; having her pcp put in a referral.

## 2023-07-05 ENCOUNTER — Ambulatory Visit: Payer: Medicare (Managed Care) | Admitting: Obstetrics and Gynecology

## 2023-07-07 ENCOUNTER — Ambulatory Visit: Payer: Medicare (Managed Care) | Admitting: Obstetrics and Gynecology

## 2023-07-09 ENCOUNTER — Ambulatory Visit: Payer: Medicare (Managed Care) | Admitting: Obstetrics and Gynecology

## 2023-07-09 ENCOUNTER — Encounter: Payer: Self-pay | Admitting: Medical

## 2023-07-09 NOTE — Telephone Encounter (Signed)
Called and spoke to patient to advise new GYN referral will be signed by Monday 11/18. Apologize for delay. She verbalized understanding and was appreciative of call.

## 2023-07-14 ENCOUNTER — Encounter: Payer: Self-pay | Admitting: Obstetrics and Gynecology

## 2023-07-15 NOTE — Telephone Encounter (Signed)
Recommend sending the following to the new GYN Dr. Edwyna Shell:  Face sheet/demographics  Telemedicine visit note with PA 07/01/2023  CT Abdomen report 03/26/2023  Pelvic US report 05/10/2023    They will probably need the records and the endometrial biopsy result from Dr. Jacqulyn Bath (most recent OBGYN) but I think patient has to request this/provide consent to them as well right?    Please contact her to advise

## 2023-07-15 NOTE — Telephone Encounter (Signed)
Are there specific office notes front end staff should fax over? Please advise

## 2023-07-15 NOTE — Telephone Encounter (Signed)
Records printed via quick release & faxed to Dr. Edwyna Shell office per PA instruction/patient request

## 2023-07-29 ENCOUNTER — Ambulatory Visit: Admit: 2023-07-29 | Payer: Self-pay | Admitting: Obstetrics and Gynecology

## 2023-07-29 SURGERY — HYSTEROSCOPY, WITH OR WITHOUT DILATION AND CURETTAGE OF UTERUS, WITH OR WITHOUT POLYPECTOMY
Anesthesia: Monitor Anesthesia Care

## 2023-08-23 ENCOUNTER — Other Ambulatory Visit
Admission: RE | Admit: 2023-08-23 | Discharge: 2023-08-23 | Disposition: A | Discharge status: Home / Self Care | Payer: Medicare (Managed Care) | Source: Ambulatory Visit | Attending: Obstetrics and Gynecology | Admitting: Obstetrics and Gynecology

## 2023-08-23 DIAGNOSIS — Z124 Encounter for screening for malignant neoplasm of cervix: Secondary | ICD-10-CM | POA: Insufficient documentation

## 2023-08-23 DIAGNOSIS — Z01419 Encounter for gynecological examination (general) (routine) without abnormal findings: Secondary | ICD-10-CM | POA: Insufficient documentation

## 2023-09-01 LAB — GYN CYTOLOGY

## 2023-09-15 ENCOUNTER — Other Ambulatory Visit: Payer: Self-pay

## 2023-09-17 ENCOUNTER — Ambulatory Visit
Admission: RE | Admit: 2023-09-17 | Discharge: 2023-09-17 | Disposition: A | Discharge status: Home / Self Care | Payer: Medicare (Managed Care) | Source: Ambulatory Visit | Attending: Obstetrics and Gynecology | Admitting: Obstetrics and Gynecology

## 2023-09-17 ENCOUNTER — Encounter
Admission: RE | Disposition: A | Discharge status: Home / Self Care | Payer: Self-pay | Source: Ambulatory Visit | Attending: Obstetrics and Gynecology

## 2023-09-17 ENCOUNTER — Other Ambulatory Visit: Payer: Self-pay

## 2023-09-17 ENCOUNTER — Ambulatory Visit: Payer: Medicare (Managed Care) | Admitting: Anesthesiology

## 2023-09-17 ENCOUNTER — Encounter: Payer: Self-pay | Admitting: Obstetrics and Gynecology

## 2023-09-17 DIAGNOSIS — E669 Obesity, unspecified: Secondary | ICD-10-CM | POA: Insufficient documentation

## 2023-09-17 DIAGNOSIS — Z9889 Other specified postprocedural states: Secondary | ICD-10-CM

## 2023-09-17 DIAGNOSIS — N189 Chronic kidney disease, unspecified: Secondary | ICD-10-CM | POA: Insufficient documentation

## 2023-09-17 DIAGNOSIS — R9389 Abnormal findings on diagnostic imaging of other specified body structures: Secondary | ICD-10-CM | POA: Insufficient documentation

## 2023-09-17 DIAGNOSIS — Z6836 Body mass index (BMI) 36.0-36.9, adult: Secondary | ICD-10-CM | POA: Insufficient documentation

## 2023-09-17 DIAGNOSIS — N840 Polyp of corpus uteri: Secondary | ICD-10-CM | POA: Insufficient documentation

## 2023-09-17 HISTORY — PX: PR HYSTEROSCOPY BX ENDOMETRIUM&/POLYPC W/WO D&C: 58558

## 2023-09-17 SURGERY — HYSTEROSCOPY, WITH OR WITHOUT DILATION AND CURETTAGE OF UTERUS, WITH OR WITHOUT POLYPECTOMY
Anesthesia: General | Site: Uterus | Wound class: Clean Contaminated

## 2023-09-17 MED ORDER — KETOROLAC TROMETHAMINE 15 MG/ML IJ SOLN *I*
INTRAMUSCULAR | Status: DC | PRN
Start: 2023-09-17 — End: 2023-09-17
  Administered 2023-09-17: 15 mg via INTRAVENOUS

## 2023-09-17 MED ORDER — IBUPROFEN 600 MG PO TABS *I*
600.0000 mg | ORAL_TABLET | Freq: Three times a day (TID) | ORAL | 0 refills | Status: AC | PRN
Start: 2023-09-17 — End: 2023-10-17
  Filled 2023-09-17: qty 30, 10d supply, fill #0

## 2023-09-17 MED ORDER — MIDAZOLAM HCL 1 MG/ML IJ SOLN *I* WRAPPED
INTRAMUSCULAR | Status: AC
Start: 2023-09-17 — End: 2023-09-17
  Filled 2023-09-17: qty 2

## 2023-09-17 MED ORDER — ONDANSETRON HCL 2 MG/ML IV SOLN *I*
INTRAMUSCULAR | Status: DC | PRN
Start: 2023-09-17 — End: 2023-09-17
  Administered 2023-09-17: 4 mg via INTRAVENOUS

## 2023-09-17 MED ORDER — LIDOCAINE HCL 2 % IJ SOLN *I*
INTRAMUSCULAR | Status: DC | PRN
Start: 2023-09-17 — End: 2023-09-17
  Administered 2023-09-17: 100 mg via INTRAVENOUS

## 2023-09-17 MED ORDER — DEXTROSE 5 % FLUSH FOR PUMPS *I*
0.0000 mL/h | INTRAVENOUS | Status: DC | PRN
Start: 2023-09-17 — End: 2023-09-17

## 2023-09-17 MED ORDER — MIDAZOLAM HCL 1 MG/ML IJ SOLN *I* WRAPPED
INTRAMUSCULAR | Status: DC | PRN
Start: 2023-09-17 — End: 2023-09-17
  Administered 2023-09-17: 2 mg via INTRAVENOUS

## 2023-09-17 MED ORDER — FENTANYL CITRATE 50 MCG/ML IJ SOLN *WRAPPED*
INTRAMUSCULAR | Status: DC | PRN
Start: 2023-09-17 — End: 2023-09-17
  Administered 2023-09-17: 100 ug via INTRAVENOUS

## 2023-09-17 MED ORDER — OXYCODONE HCL 5 MG/5ML PO SOLN *I*
5.0000 mg | Freq: Once | ORAL | Status: DC | PRN
Start: 2023-09-17 — End: 2023-09-17

## 2023-09-17 MED ORDER — LACTATED RINGERS IV SOLN *I*
125.0000 mL/h | INTRAVENOUS | Status: DC
Start: 2023-09-17 — End: 2023-09-17
  Administered 2023-09-17: 125 mL/h via INTRAVENOUS

## 2023-09-17 MED ORDER — OXYCODONE HCL 5 MG/5ML PO SOLN *I*
10.0000 mg | Freq: Once | ORAL | Status: DC | PRN
Start: 2023-09-17 — End: 2023-09-17

## 2023-09-17 MED ORDER — FENTANYL CITRATE 50 MCG/ML IJ SOLN *WRAPPED*
INTRAMUSCULAR | Status: AC
Start: 2023-09-17 — End: 2023-09-17
  Filled 2023-09-17: qty 2

## 2023-09-17 MED ORDER — HYDROMORPHONE HCL PF 1 MG/ML IJ SOLN *WRAPPED*
0.5000 mg | INTRAMUSCULAR | Status: DC | PRN
Start: 2023-09-17 — End: 2023-09-17

## 2023-09-17 MED ORDER — DEXAMETHASONE SODIUM PHOSPHATE 4 MG/ML INJ SOLN *WRAPPED*
INTRAMUSCULAR | Status: DC | PRN
Start: 2023-09-17 — End: 2023-09-17
  Administered 2023-09-17: 4 mg via INTRAVENOUS

## 2023-09-17 MED ORDER — ACETAMINOPHEN 325 MG PO TABS *I*
650.0000 mg | ORAL_TABLET | Freq: Four times a day (QID) | ORAL | 0 refills | Status: AC | PRN
Start: 2023-09-17 — End: 2023-10-17
  Filled 2023-09-17: qty 100, 12d supply, fill #0

## 2023-09-17 MED ORDER — PROPOFOL 10 MG/ML IV EMUL (INTERMITTENT DOSING) WRAPPED *I*
INTRAVENOUS | Status: DC | PRN
Start: 2023-09-17 — End: 2023-09-17
  Administered 2023-09-17: 200 mg via INTRAVENOUS

## 2023-09-17 MED ORDER — HALOPERIDOL LACTATE 5 MG/ML IJ SOLN *I*
0.5000 mg | Freq: Once | INTRAMUSCULAR | Status: DC | PRN
Start: 2023-09-17 — End: 2023-09-17

## 2023-09-17 MED ORDER — LACTATED RINGERS IV SOLN *I*
INTRAVENOUS | Status: DC | PRN
Start: 2023-09-17 — End: 2023-09-17

## 2023-09-17 MED ORDER — SODIUM CHLORIDE 0.9 % FLUSH FOR PUMPS *I*
0.0000 mL/h | INTRAVENOUS | Status: DC | PRN
Start: 2023-09-17 — End: 2023-09-17

## 2023-09-17 SURGICAL SUPPLY — 14 items
ACCESSORY WASTE MANAGEMENT 3.0 FOR AVETA SYSTEM (Other) ×1 IMPLANT
AVETA FLUID MANAGEMENT ACCESSORY (Supply) ×1 IMPLANT
AVETA PEARL HYSTEROSCOPE DISP 5.7MM (Supply) ×1 IMPLANT
AVETA RESECTING DEVICE DISPO FLEX 2.9MM (Supply) ×1 IMPLANT
CURETTE ENDOMETRIAL SUCT 3.1MM (Supply) ×1 IMPLANT
DRAPE UNDERBUTTOCKS 33.5X14X32IN 100% PP SMS W/POUCH SCREEN-PORT (Drape) ×1 IMPLANT
GLOVE BIOGEL PI INDICATOR UNDER SZ 7 LF (Glove) ×1 IMPLANT
GLOVE BIOGEL PI MICRO IND UNDER SZ 7.0 LF (Glove) ×1 IMPLANT
GLOVE BIOGEL PI MICRO IND UNDER SZ 7.5 LF (Glove) ×1 IMPLANT
GLOVE SURG BIOGEL PI ULTRATOUCH SZ 6.5 (Glove) ×1 IMPLANT
GLOVE SURG BIOGEL PI ULTRATOUCH SZ 7.0 (Glove) ×2 IMPLANT
PACK CUSTOM HYSTEROSCOPY (Pack) ×1 IMPLANT
SOL SOD CHL IV .9PCT 1000ML BAG (Drug) ×1 IMPLANT
SOL SODIUM CHLORIDE IRRIG 1000ML BTL (Solution) ×1 IMPLANT

## 2023-09-17 NOTE — Discharge Instructions (Signed)
 DISCHARGE INSTRUCTIONS  Hysteroscopy, dilation and curretage        You have received sedative medication and/or general anesthesia which may make you drowsy for as long as 24 hours:     A)  DO NOT drive or operate any machinery for 24 hours     B)  DO NOT drink alcoholic beverages for 24 hours     C)  DO NOT make major decisions, sign contracts, etc for 24 hours  Please continue to adhere to these precautions if you are taking narcotic medication.    Discomfort after surgery:  - It is normal to have mild to moderate abdominal cramping for a few days to a few weeks  - Use acetaminophen (Tylenol) or ibuprofen for pain unless you are allergic to these drugs    Surgery site care after surgery  - It is normal to have some vaginal bleeding for up to 2 weeks after surgery.    Activity after surgery  - You should rest the day of the procedure  - You can resume normal activities the day following your procedure  - Shower: You may shower  - Driving: you may drive after 24 hours  - Exercise: Do not resume vigorous exercise for 1 week.  - Intercourse: Nothing in the vagina (no tampons, douching or intercourse) for 1 week or until bleeding/discharge has resolved    Diet after surgery  - You may feel nauseated from the surgery or anesthesia.  Advance diet as tolerated.    Medications  See medication reconciliation sheet    When to call your doctor:  - fever greater than 100 degrees F and/or chills  - Nausea and vomiting  - inability to urinate  - severe cramping or abdominal pain not relieved by acetaminophen (Tylenol) or ibuprofen  - foul smelling discharge  - heavy vaginal bleeding (soaking through 1 pad every 1-2 hours)  - With any other concerns or questions  - If you have an emergency and are unable to contact your doctor, go to the ED

## 2023-09-17 NOTE — Op Note (Signed)
 Operative Note (Surgical Case/Log ID: [id number])       Date of Surgery: 09/17/2023       Surgeons: Surgeons and Role:     * Parks Neptune, DO - Primary     * Durenda Age, MD - Assisting   Assistants:         Pre-op Diagnosis: Pre-Op Diagnosis Codes:      * Thickened endometrium [R93.89]       Post-op Diagnosis: Post-Op Diagnosis Codes:     * Thickened endometrium [R93.89]       Procedure(s) Performed: Procedure(s) (LRB):  HYSTEROSCOPY, WITH DILATION AND CURETTAGE OF UTERUS, POLYPECTOMY, WITH AVETA (N/A)       Anesthesia Type: General        Fluid Totals: I/O this shift:  01/24 0700 - 01/24 1459  In: 560 (6.4 mL/kg) [P.O.:60; I.V.:500]  Out: 20 (0.2 mL/kg) [Blood:20]  Net: 540  Weight: 87.1 kg        Estimated Blood Loss: 1 cc       Specimens to Pathology:  ID Type Source Tests Collected by Time Destination   A : Endometrial Currettings and endometrial polyps TISSUE Uterus SURGICAL PATHOLOGY Gustavus Messing, RN 09/17/2023 0915             Temporary Implants: None       Packing:  None               Patient Condition: good       Indications: Veronica Bishop is a 68 y.o. G2P2002 F who presents with thickened endometrial stripe with atypical features.       Findings (Including unexpected complications): Exam under anesthesia revealed normal appearing external genitalia, urethra, vagina and small cervix.  Mobile, anteverted uterus with normal contour.     Hysteroscopy revealed posterior wall pedunculated endometrial polyp and posterior wall endometrial polyp. Bilateral tubal ostia visualized and no cavitary pathology noted.           Description of Procedure:     The patient was taken to the operating room and stop check was performed confirming the patient and procedure to be completed.  General anesthesia was administered without difficulty.  The patient was positioned in dorsal lithotomy position, prepped and draped in the usual sterile fashion.  The surgical pause was completed.     Two handheld retractors were placed  in the patient's vagina, the cervix was visualized and a single tooth tenaculum was applied to the anterior lip of the cervix at 12 o'clock.  The cervix was progressively dilated to a 18 Hank dilator.  The hysteroscope was inserted into the cervical canal and advanced with visualization of the uterine body, fundus and both tubal ostia.  Two endometrial polyps were noted. Pictures were taken to document findings.  The hysteroscopic morcellator was prepared and primed according to the manufacturer's instructions.  The morcellator was then advanced through the operative channel into the uterine cavity and the polyps was successfully removed using morcellation.  Pictures were taken to document satisfactory results.  The morecellator and hysteroscope were removed with minimal bleeding noted, the a sharp curettage was then performed. The specimen was labeled and sent to pathology. The tenaculum was removed with good hemostasis noted.      All sponge, needle and instrument counts were correct at the end of the procedure.  The patient was reversed from anesthesia, extubated in the operating room 3 and taken to the PACU in stable condition.      Dr. Edwyna Shell  was scrubbed in for the entire procedure.    Durenda Age, MD   Obstetrics & Gynecology PGY-3  Pager [phone]        Signed:  Durenda Age, MD  on 09/17/2023 at 9:58 AM

## 2023-09-17 NOTE — Anesthesia Procedure Notes (Signed)
---------------------------------------------------------------------------------------------------------------------------------------    AIRWAY   GENERAL INFORMATION AND STAFF    Patient location during procedure: OR       Date of Procedure: 09/17/2023 9:07 AM  CONDITION PRIOR TO MANIPULATION     Current Airway/Neck Condition:  Normal        For more airway physical exam details, see Anesthesia PreOp Evaluation  AIRWAY METHOD     Patient Position:  Sniffing    Preoxygenated: yes      Maintained In-Line Stability: not needed, normal c-spine condition          To see details of medications used, see MAR    Induction: IV    Number of Attempts at Approach:  1    Number of Other Approaches Attempted:  0  FINAL AIRWAY DETAILS    Final Airway Type:  LMA    Final LMA: Unique    LMA Size: 3  ----------------------------------------------------------------------------------------------------------------------------------------

## 2023-09-17 NOTE — Anesthesia Preprocedure Evaluation (Addendum)
 Anesthesia Pre-operative History and Physical for Veronica Bishop    Highlighted Issues for this Procedure:  68 y.o. female with THICKENED ENDOMETRIAL LINING presenting for Procedure(s):  HYSTEROSCOPY, WITH OR WITHOUT DILATION AND CURETTAGE OF UTERUS, by Surgeon(s):  Parks Neptune, DO scheduled for 75 minutes.    Allergies:   -- Sulfa Antibiotics     --  Created by Conversion - 0;   -- Sodium Lauryl Sulfate -- Other (See Comments)    --  Makes mouth feel "slimey"   -- No Known Latex Allergy     Habitus:  - Estimated body mass index is 36.28 kg/m as calculated from the following:     Height as of this encounter: 1.549 m (5\' 1" ).    Weight as of this encounter: 87.1 kg (192 lb).                  .  .  Anesthesia Evaluation Information Source: patient, records     ANESTHESIA HISTORY  Pertinent(-):  No History of anesthetic complications or Family hx of anesthetic complications    GENERAL    + Obesity  Pertinent (-):  No history of anesthetic complications or Family Hx of Anesthetic Complications    HEENT  Comment: Hx vitreous hemorrhage, posterior vitreous detachment PULMONARY  Pertinent(-):  No smoking, asthma or COPD    CARDIOVASCULAR  Good(4+METs) Exercise Tolerance  Pertinent(-):  No hypertension    GI/HEPATIC/RENAL   NPO: > 8hrs ago (solids) and > 2hrs ago (clears)      + Renal Issues          CKD NEURO/PSYCH/ORTHO  Pertinent(-):  No seizures or cerebrovascular event    ENDO/OTHER  Pertinent(-):  No diabetes mellitus, thyroid disease             Physical Exam    Airway            Mouth opening: normal            Mallampati: III            TM distance (fb): >3 FB            Neck ROM: limited  Dental   Normal Exam   Cardiovascular  Normal Exam         Pulmonary   Normal Exam    Mental Status   Normal Exam           ________________________________________________________________________  PLAN  ASA Score  2  Anesthetic Plan general       Induction (routine IV) General Anesthesia/Sedation Maintenance Plan (inhaled  agents and IV bolus);  Airway Manipulation (none); Airway (LMA); Line ( use current access); Monitoring (standard ASA); Positioning (supine); PONV Plan (dexamethasone and ondansetron); Pain (per surgical team); PostOp (PACU)Standard Attestation    Informed Consent     Risks:          Risks discussed were commensurate with the plan listed above with the following specific points: N/V, aspiration, sore throat and hypotension, Damage to: eyes, nerves, blood vessels and teeth, allergic Rx, unexpected serious injury and awareness.    Anesthetic Consent:         Anesthetic plan (and risks as noted above) were discussed with patient    Blood products Consent:        Use of blood products discussed with: patient and they consented    Plan also discussed with team members including:       attending    Responsible Anesthesia Provider  Attestation:  I attest that the patient or proxy understands and accepts the risks and benefits of the anesthesia plan. I also attest that I have personally performed a pre-anesthetic examination and evaluation, and prescribed the anesthetic plan for this particular location within 48 hours prior to the anesthetic as documented. Lauro Regulus, MD  09/17/23, 9:11 AM

## 2023-09-17 NOTE — Anesthesia Case Conclusion (Signed)
 CASE CONCLUSION  Emergence  Actions:  LMA removed  Criteria Used for Airway Removal:  Adequate Tv & RR and acceptable O2 saturation  Assessment:  Routine  Transport  Directly to: PACU  Airway:  Facemask  Oxygen Delivery:  10 lpm  Position:  Recumbent  Patient Condition on Handoff  Level of Consciousness:  Moderately sedated  Patient Condition:  Stable  Handoff Report to:  RN

## 2023-09-17 NOTE — Preop H&P (Signed)
 UPDATES TO PATIENT'S CONDITION on the DAY OF SURGERY/PROCEDURE    I. Updates to Patient's Condition (to be completed by a provider privileged to complete a H&P, following reassessment of the patient by the provider):    Day of Surgery/Procedure Update:  History  History reviewed and no change    Physical  Physical exam updated and no change            II. Procedure Readiness   I have reviewed the patient's H&P and updated condition. By completing and signing this form, I attest that this patient is ready for surgery/procedure.    III. Attestation   I have reviewed the updated information regarding the patient's condition and it is appropriate to proceed with the planned surgery/procedure.      Parks Neptune, DO as of 8:23 AM 09/17/2023

## 2023-09-20 ENCOUNTER — Encounter: Payer: Self-pay | Admitting: Obstetrics and Gynecology

## 2023-09-21 NOTE — Anesthesia Postprocedure Evaluation (Signed)
 Anesthesia Post-Op Note    Patient: Veronica Bishop    Procedure(s) Performed:  Procedure Summary  Date:  09/17/2023 Anesthesia Start: 09/17/2023  8:47 AM Anesthesia Stop: 09/17/2023  9:28 AM Room / Location:  [facility]_OR_03 / [facility] MAIN OR   Procedure(s):  HYSTEROSCOPY, WITH DILATION AND CURETTAGE OF UTERUS, POLYPECTOMY, WITH AVETA Diagnosis:  THICKENED ENDOMETRIAL LINING Surgeon(s):  Parks Neptune, DO  Durenda Age, MD Responsible Anesthesia Provider:  Lauro Regulus, MD         Recovery Vitals  No data recorded  Anesthesia type:  general  Complications Noted During Procedure or in PACU:  None   Comment:    Patient Location:  PACU  Level of Consciousness:    Recovered to baseline  Patient Participation:     Able to participate  Temperature Status:    Normothermic  Oxygen Saturation:    Within patient's normal range  Cardiac Status:   within patient's normal range  Fluid Status:    Stable  Airway Patency:     Yes  Pulmonary Status:    Baseline  Pain Management:    Adequate analgesia  Nausea and Vomiting:    Controlled    Post Op Assessment:    Tolerated procedure well  Responsible Anesthesia Provider Attestation:  All indicated post anesthesia care provided       -

## 2023-09-23 LAB — SURGICAL PATHOLOGY

## 2023-09-27 ENCOUNTER — Telehealth: Payer: Self-pay | Admitting: Retina Ophthalmology

## 2023-09-27 ENCOUNTER — Telehealth: Payer: Self-pay | Admitting: Primary Care

## 2023-09-27 NOTE — Telephone Encounter (Signed)
09/27/2023    I wanted you to be aware that the following patient has cancelled their appointment.    Provider Name: Dr. Johnnette Litter   Reason for Cancellation: Patient will be out of town   Patient Rescheduled (Date/Time): 10/26/23 at 3:30pm    Patient did not reschedule, will call back N/A   Patient does not want to reschedule N/A   Patient aware of change of location from original scheduled appointment Patient is aware this will be at [facility]   Date of Appointment: 10/01/23   Patient Name: Veronica Bishop   MRN: [id number]   DOB: 01/19/1956       Thank you,  Hassel Neth

## 2023-09-27 NOTE — Telephone Encounter (Addendum)
-----   Message from Karen Kays, MD sent at 03/26/2023  8:00 AM EDT -----  Regarding: adenexal lesion  Pelvic US in six months    Patient is now managed by gyn

## 2023-10-01 ENCOUNTER — Ambulatory Visit: Payer: Medicare (Managed Care) | Admitting: Retina Ophthalmology

## 2023-10-25 NOTE — Assessment & Plan Note (Signed)
PVD with VH and retinal tears  --s/p barrier LPC OD (05/20/14)  --s/p barrier LPC OD (05/21/14)  --s/p barrier LPC OD (05/24/14)   --doing great, no new heme or tears  --f/u 1 yr OCT OU, dilated OU  --SSX of RD explained and understood    Chronic pseudophakic ME OS  --hx of RD repair x 3 (2011), now resolved    Hx RD repair OS  --RD repair x  3 (2011)  --See above    PCIOL OS  --PCO OS, poor visual prognosis, follow

## 2023-10-26 ENCOUNTER — Encounter: Payer: Self-pay | Admitting: Retina Ophthalmology

## 2023-10-26 ENCOUNTER — Other Ambulatory Visit: Payer: Self-pay

## 2023-10-26 ENCOUNTER — Ambulatory Visit: Payer: Medicare (Managed Care) | Attending: Retina Ophthalmology | Admitting: Retina Ophthalmology

## 2023-10-26 DIAGNOSIS — H33311 Horseshoe tear of retina without detachment, right eye: Secondary | ICD-10-CM | POA: Insufficient documentation

## 2023-10-26 NOTE — Progress Notes (Signed)
 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 Pt notes it seems like a   cataract. Pt notes no flashes or new floaters or pain.    Ocular meds:  None               Last edited by Richarda Osmond on 10/26/2023  3:29 PM.        Current Medications[1]  Sulfa antibiotics, Sodium lauryl sulfate, and No known latex allergy   No birth history on file.  Past Medical History:   Diagnosis Date    Cataract     Chronic kidney disease     IC (interstitial cystitis)     Macular edema     PERSISITENT PSEUDOPHAKIC OS    PVD (posterior vitreous detachment), right 05/21/2014    Refractive error 03/27/2011    Varicella     Vitreous hemorrhage 05/21/2014      Past Surgical History:   Procedure Laterality Date    25G PPV/IVT/AFX/EL/22%SF6 OS  05/16/2009    Avastin #1 OS 08/14/11 consent 08/14/11 maxitrol      barrier laser, right eye  05/20/2014    Dr Karalee Height    BARRIER LPC OS  07/30/2009    BUNIONECTOMY      Hallux Valgus (Bunion) Correction Conversion Data     CATARACT REMOVAL  10/24/2009    PC IOL OS    CESAREAN SECTION, UNSPECIFIED      Cesarean Section Conversion Data     HX TONSILLECTOMY/ADENOIDECTOMY      Tonsillectomy Conversion Data     INTRA VITREAL TRIESENCE OS  05/30/2010    4MG     PHACO/IOL/25G  PPV/PCOX/MP/AFX/ICG/ILMX/14% C3 F8 OS  10/24/2009    PR HYSTEROSCOPY BX ENDOMETRIUM&/POLYPC W/WO D&C N/A 09/17/2023    Procedure: HYSTEROSCOPY, WITH DILATION AND CURETTAGE OF UTERUS, POLYPECTOMY, WITH AVETA;  Surgeon: Parks Neptune, DO;  Location: [facility];  Service: OBGYN    RETINAL DETACHMENT SURGERY      X 3    SB/20G PPV/MO/PFC/RTX/AFX/15% C3 F8 OS  06/20/2009      Tobacco Use History[2]   Social History     Substance and Sexual Activity   Alcohol Use Yes    Comment: on weekends      Social History     Substance and Sexual Activity   Drug Use No      Specialty Problems          Ophthalmology Problems    Cataract  Cystoid macular edema of left eye        Refractive error        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       Objective:   Objective There were no vitals filed for this visit.    Base Eye Exam       Visual Acuity (Snellen - Linear)         Right Left    Dist cc 20/20 -1 CF at face              Tonometry (Tonopen, 3:33 PM)         Right Left    Pressure 22 21              Pupils         Dark Light Shape React APD    Right 5 3 Round Brisk None    Left 5 4 Round Brisk None              Neuro/Psych       Oriented x3: Yes    Mood/Affect: Normal              Dilation       Both eyes: 2.5% Phenylephrine, 1.0% Tropicamide, 0.5% Proparacaine @ 3:35 PM                  Slit Lamp and Fundus Exam       External Exam         Right Left    External Normal Normal              Slit Lamp Exam         Right Left    Lids/Lashes Normal Normal    Conjunctiva/Sclera White and quiet White and quiet    Cornea Clear Synechia    Anterior Chamber Deep and quiet  Deep and quiet    Iris Round and reactive Round and reactive    Lens 1+ Nuclear sclerosis Posterior chamber intraocular lens; 4+ PCO    Vitreous Posterior vitreous detachment; mild VH poor view due to dense PCO              Fundus Exam         Right Left    Disc Normal Normal    C/D Ratio 0.3 0.5    Macula Normal SR band, resolved ME    Vessels Normal attenuated    Periphery Multiple tears scattered 360 all lasered with no new heme, tear or break inf 180 Retx; attached, Laser scar, Macula attached, laser                                 OCT, mac-OU          Right Eye  Findings include: Stable. Testing reliability good.     Left Eye  Findings include: Stable. OCT- Macula findings: Atrophy. Testing reliability poor.     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                                   [1]   Current Outpatient Medications:     VITAMIN D PO, Take by mouth., Disp: , Rfl:     probiotic (ALIGN) capsule, Take 1 capsule by mouth daily., Disp: , Rfl:     tolterodine (DETROL LA) 4 mg 24 hr capsule, Take 1 capsule (4 mg total) by mouth daily. Swallow whole. Do not crush, break, or chew., Disp: 90 capsule, Rfl: 2    albuterol HFA (PROVENTIL, VENTOLIN, PROAIR HFA) 108 (90 Base) MCG/ACT inhaler, Inhale 1-2 puffs into the lungs every 4-6 hours as needed for Wheezing or Shortness of Breath  Shake well before each use. (Patient not taking: Reported on 07/01/2023), Disp: 1 each, Rfl: 1     EPINEPHrine (EPIPEN) 0.3 mg/0.3 mL auto-injector, INJECT 0.3ML INTRAMUSCULARLY AS DIRECTED, Disp: 2 each, Rfl: 1    naproxen sodium (ANAPROX) 220 MG tablet, Take 1 tablet (220 mg total) by mouth as needed., Disp: , Rfl:     aspirin 81 MG tablet, Take 1 tablet (81 mg total) by mouth daily., Disp: , Rfl:   [2]   Social History  Tobacco Use   Smoking Status Former    Types: Cigarettes    Quit date: 02/15/1981    Years since quitting: 42.7   Smokeless Tobacco Never

## 2024-01-31 ENCOUNTER — Other Ambulatory Visit: Payer: Self-pay

## 2024-01-31 ENCOUNTER — Ambulatory Visit: Payer: Medicare (Managed Care) | Attending: Primary Care

## 2024-01-31 VITALS — BP 128/72 | HR 77 | Temp 97.1°F | Wt 196.2 lb

## 2024-01-31 DIAGNOSIS — H00012 Hordeolum externum right lower eyelid: Secondary | ICD-10-CM

## 2024-01-31 NOTE — Progress Notes (Signed)
 [facility] [place]: Outpatient Follow-up Progress Note    Chief Complaint   Patient presents with    Follow-up     Right eye stye (legally blind left eye), right ear pain some blood on q-tip when cleaning   SUBJECTIVE    Veronica Bishop  is a 68 y.o. female  here for a follow-up visit. Today we discussed:    Patient has concerns of stye on right eye. She noticed it a few days ago. She recently started applying warm compresses to the right eye. It is a little tender. She denies any vision changes in her right eye. It is not obstructing her vision.     She also has some concerns for her right ear. She noticed a little blood on a qtip after cleaning her right ear.     The patient's medications were reviewed and reconciled with the patient.   The patient's allergies were reviewed and confirmed with the patient.  The patient's past medical, surgical, social and/or family histories were reviewed and updated in the appropriate sections of eRecord.    REVIEW OF SYSTEMS  As Above    OBJECTIVE  Vitals:    01/31/24 1408   BP: 128/72   Pulse: 77   Temp: 36.2 C (97.1 F)   Weight: 89 kg (196 lb 3.2 oz)     Physical Exam  Constitutional:       General: She is not in acute distress.     Appearance: Normal appearance. She is not ill-appearing or toxic-appearing.   HENT:      Right Ear: Tympanic membrane, ear canal and external ear normal. Tympanic membrane is not erythematous or bulging.      Left Ear: Tympanic membrane, ear canal and external ear normal. Tympanic membrane is not erythematous or bulging.      Ears:      Comments: Small amount of cerumen present in right ear. TM visible, no signs of infection.   Eyes:      General: Lids are normal. Gaze aligned appropriately.         Right eye: Hordeolum (lower lid) present.      Conjunctiva/sclera: Conjunctivae normal.      Pupils: Pupils are equal, round, and reactive to light.      Comments: Small hordeolum present right outer lower lid    Pulmonary:       Effort: Pulmonary effort is normal.   Neurological:      Mental Status: She is alert and oriented to person, place, and time.   Psychiatric:         Behavior: Behavior is cooperative.     ASSESSMENT & PLAN    DDX: Hordeolum vs chalazion     1. Hordeolum externum of right lower eyelid (Primary)  -Continue with warm compresses  -Return to office if symptoms fail to improve or worsen      The following patient instructions were reviewed verbally with the patient, printed on their after visit summary and given to them at the end of today's visit.     Patient Instructions   -Warm compresses 4 times a day 5-10 minutes at a time  -Avoid eye make up and contact lenses until the stye has resolved  -Wash pillow cases  -Return to the office if you develop any worsening symptoms or if no resolution by the end of next week       Follow up is scheduled as follows:  Future Appointments  Provider Department Center    01/31/2024 2:00 PM Salem Crater, NP [facility] [place]     03/30/2024 10:00 AM Deiss, Hilarie Lovely, MD [facility] [place]     11/07/2024 10:30 AM Diloreto, Myrtie Atkinson, MD [facility] [place]            Patient was also instructed to call office or send MyChart for any questions or concerns and to return to the office sooner than next scheduled appointment if needed.      Salem Crater, FNP-BC  Family Medicine    This note has been dictated using Animal nutritionist.  Reasonable attempts to correct typing mistakes have been done.  Please excuse any inherent inaccuracy that might have escaped.

## 2024-01-31 NOTE — Patient Instructions (Signed)
-  Warm compresses 4 times a day 5-10 minutes at a time  -Avoid eye make up and contact lenses until the stye has resolved  -Wash pillow cases  -Return to the office if you develop any worsening symptoms or if no resolution by the end of next week

## 2024-02-11 ENCOUNTER — Other Ambulatory Visit: Payer: Self-pay | Admitting: Primary Care

## 2024-02-11 NOTE — Telephone Encounter (Signed)
 Last office visit with doctor:   01/31/2024  Last office visit with APP:   Visit date not found  Patients upcoming appointments:  Future Appointments   Date Time Provider Department Center   03/30/2024 10:00 AM Deiss, Pierce, MD [facility] None   11/07/2024 10:30 AM Genaro Lenis, MD [facility] None     Recent Lab results:  GENERAL CHEMISTRY   Recent Labs     04/09/23  1250 03/26/23  0153   NA 140 141   K 4.2 3.6   CL 101 104   CO2 28 26   GAP 11 11   UN 11 11   CREAT 0.76 0.77   GLU 95 109*   CA 9.9 9.2      LIPID PROFILE   Recent Labs     04/09/23  1250   CHOL 206*   TRIG 174*   HDL 41   LDLC 130*      LIVER PROFILE   Recent Labs     03/26/23  0153   ALT 19   AST 20   ALK 83   TB 0.5      DIABETES THYROID   Recent Labs     04/09/23  1250   HA1C 5.8*    No value within the past 365 days      Pending/Orders Labs:  Lab Frequency Next Occurrence        .pcn

## 2024-03-30 ENCOUNTER — Encounter: Payer: Medicare (Managed Care) | Admitting: Primary Care

## 2024-03-31 ENCOUNTER — Telehealth: Payer: Self-pay | Admitting: Primary Care

## 2024-03-31 DIAGNOSIS — E782 Mixed hyperlipidemia: Secondary | ICD-10-CM

## 2024-03-31 DIAGNOSIS — R7303 Prediabetes: Secondary | ICD-10-CM

## 2024-03-31 DIAGNOSIS — E559 Vitamin D deficiency, unspecified: Secondary | ICD-10-CM

## 2024-03-31 NOTE — Telephone Encounter (Signed)
 Patient called office for upcoming appointment with PA on 04/05/24.SABRAShe is requesting labs to be done prior.Please authorize attached if acceptable.No response needed as patient will see orders in her MyChart.

## 2024-03-31 NOTE — Telephone Encounter (Signed)
 Labs ordered - fasting

## 2024-04-03 ENCOUNTER — Other Ambulatory Visit
Admission: RE | Admit: 2024-04-03 | Discharge: 2024-04-03 | Disposition: A | Discharge status: Home / Self Care | Payer: Medicare (Managed Care) | Source: Ambulatory Visit | Attending: Medical | Admitting: Medical

## 2024-04-03 DIAGNOSIS — R7303 Prediabetes: Secondary | ICD-10-CM | POA: Insufficient documentation

## 2024-04-03 DIAGNOSIS — E559 Vitamin D deficiency, unspecified: Secondary | ICD-10-CM | POA: Insufficient documentation

## 2024-04-03 DIAGNOSIS — E782 Mixed hyperlipidemia: Secondary | ICD-10-CM | POA: Insufficient documentation

## 2024-04-03 LAB — CBC AND DIFFERENTIAL
Baso # K/uL: 0 THOU/uL (ref 0.0–0.2)
Eos # K/uL: 0.1 THOU/uL (ref 0.0–0.5)
Hematocrit: 42 % (ref 34–49)
Hemoglobin: 14.1 g/dL (ref 11.2–16.0)
IMM Granulocytes #: 0 THOU/uL
IMM Granulocytes: 0 %
Lymph # K/uL: 2.4 THOU/uL (ref 1.0–5.0)
MCV: 93 fL (ref 75–100)
Mono # K/uL: 0.4 THOU/uL (ref 0.1–1.0)
Neut # K/uL: 2.7 THOU/uL (ref 1.5–6.5)
Platelets: 272 THOU/uL (ref 150–450)
RBC: 4.5 MIL/uL (ref 4.0–5.5)
RDW: 12.9 % (ref 0.0–15.0)
Seg Neut %: 47.9 %
WBC: 5.7 THOU/uL (ref 3.5–11.0)

## 2024-04-03 LAB — COMPREHENSIVE METABOLIC PANEL
ALT: 19 U/L (ref 0–35)
AST: 21 U/L (ref 0–35)
Albumin: 4.3 g/dL (ref 3.5–5.2)
Alk Phos: 84 U/L (ref 35–105)
Anion Gap: 12 (ref 7–16)
Bilirubin,Total: 0.5 mg/dL (ref 0.0–1.2)
CO2: 27 mmol/L (ref 20–28)
Calcium: 9.9 mg/dL (ref 8.6–10.2)
Chloride: 102 mmol/L (ref 96–108)
Creatinine: 0.74 mg/dL (ref 0.51–0.95)
Glucose: 94 mg/dL (ref 60–99)
Lab: 16 mg/dL (ref 6–20)
Potassium: 4.4 mmol/L (ref 3.3–5.1)
Sodium: 141 mmol/L (ref 133–145)
Total Protein: 6.6 g/dL (ref 6.3–7.7)
eGFR BY CREAT: 89

## 2024-04-03 LAB — LIPID PANEL
Chol/HDL Ratio: 4.3
Cholesterol: 251 mg/dL — AB
HDL: 59 mg/dL (ref 40–60)
LDL Calculated: 160 mg/dL — AB
Non HDL Cholesterol: 192 mg/dL
Triglycerides: 179 mg/dL — AB

## 2024-04-03 LAB — VITAMIN D: 25-OH Vit Total: 43 ng/mL (ref 30–60)

## 2024-04-03 LAB — TSH: TSH: 1.5 u[IU]/mL (ref 0.27–4.20)

## 2024-04-03 LAB — HEMOGLOBIN A1C: Hemoglobin A1C: 5.9 % — ABNORMAL HIGH (ref <=5.6)

## 2024-04-04 ENCOUNTER — Other Ambulatory Visit: Payer: Self-pay

## 2024-04-05 ENCOUNTER — Encounter: Payer: Self-pay | Admitting: Medical

## 2024-04-05 ENCOUNTER — Ambulatory Visit: Payer: Self-pay | Admitting: Medical

## 2024-04-05 ENCOUNTER — Ambulatory Visit: Payer: Medicare (Managed Care) | Attending: Primary Care | Admitting: Medical

## 2024-04-05 VITALS — BP 126/78 | HR 73 | Temp 97.6°F | Ht 61.5 in | Wt 194.1 lb

## 2024-04-05 DIAGNOSIS — E782 Mixed hyperlipidemia: Secondary | ICD-10-CM

## 2024-04-05 DIAGNOSIS — Z0001 Encounter for general adult medical examination with abnormal findings: Secondary | ICD-10-CM

## 2024-04-05 DIAGNOSIS — R9389 Abnormal findings on diagnostic imaging of other specified body structures: Secondary | ICD-10-CM

## 2024-04-05 DIAGNOSIS — R200 Anesthesia of skin: Secondary | ICD-10-CM

## 2024-04-05 DIAGNOSIS — N3281 Overactive bladder: Secondary | ICD-10-CM

## 2024-04-05 DIAGNOSIS — Z0000 Encounter for general adult medical examination without abnormal findings: Secondary | ICD-10-CM

## 2024-04-05 DIAGNOSIS — R7303 Prediabetes: Secondary | ICD-10-CM

## 2024-04-05 DIAGNOSIS — R202 Paresthesia of skin: Secondary | ICD-10-CM

## 2024-04-05 DIAGNOSIS — Z1231 Encounter for screening mammogram for malignant neoplasm of breast: Secondary | ICD-10-CM

## 2024-04-05 MED ORDER — METFORMIN HCL 500 MG PO TB24 *I*: 500.0000 mg | ORAL_TABLET | Freq: Every day | ORAL | 1 refills | Status: DC

## 2024-04-05 NOTE — Patient Instructions (Addendum)
 Please call the imaging office to schedule your Mammography screening BILATERAL  as soon at possible.[facility]: [phone] & [facility]: [phone]: [phone]: [phone] or [phone]: [phone]: [phone]: [phone]: [phone] [phone] going outside of the [facility] please let us  know as the other offices require us  to manually fax over the order requisition.Healthy Living Diet Recommendations:Diet should be lower in calories, limit carbohydrates/starches (bread, pasta, rice, potatoes, cakes, cookies, and white foods). Reduce intake of saturated fats, processed foods, refined sugars/fructose, and inflammatory seed oils. Increase fresh veggies and fruits in diet and get 150 minutes of moderate exercise per week. The Mediterranean Diet is a good choice. http://www.wall-moore.info/ for some basic nutritional information and information about portion sizes. Atkins.com Carbohydrate counter tool, meal plans and recipes, and more free resources https://www.atkins.com/how-it-works/free-toolsApps: Programmer, systems (https://www.supertracker.[place]da.gov/)MyFitnessPalWeightWatchers (subscription required)Noom (subscription required)Thank you for completing your Annual Exam and Initial Annual Medicare visit with us  today. The purpose of this visits was un:Drmzzw for diseaseAssess risk of future medical problemsHelp develop a healthy lifestyleUpdate vaccinesGet to know your doctor in case of an illnessPatient Care Team:Deiss, Katharine, MD as PCP - [facility] ([facility]ology)Cranston, Cheri, MD (Obstetrics and Gynecology) Medicare 5 Year PlanThe following items were identified as areas of concern during your screening today:BMI  greater than 25 - This is a risk for Heart Attack, Stroke, High Blood Pressure, Diabetes, High Cholesterol and other complications. The Health Maintenance table below identifies screening tests and immunizations recommended by your health care team:Health Maintenance: These screening recommendations are based on [place], [employer], and [place] state guidelines Topic Date Due  HIV Screening  Never done  MMR Vaccine (1 of 1 - Standard series) Never done  COVID-19 Vaccine (4 - 2024-25 season) 04/25/2023  Flu Shot (1) 04/24/2024  Breast Cancer Screening  06/15/2024  Cervical Cancer Screening  08/22/2024  Depression - Yearly  04/05/2025  Fall Risk Screening  04/05/2025  Colon Cancer Screening - Other  11/15/2029  DTaP/Tdap/Td Vaccines (3 - Td or Tdap) 03/30/2033  Shingles Vaccine  Completed  Hepatitis C Screening  Completed  Osteoporosis Screening  Completed  Pneumococcal Vaccination  Completed  Hepatitis B Vaccine  Aged Out  HIB Vaccine  Aged Out  HPV Vaccine  Aged Out  Meningococcal Vaccine  Aged Out  Rotavirus Vaccine  Aged Out  Meningitis Vaccine  Aged Out In addition, goals and orders placed to address these recommendations are listed in the Today's Visit section.We wish you the best of health and look forward to seeing you again next year for your Annual Medicare Wellness Visit. If you have any health care concerns before then, please do not hesitate to contact us .

## 2024-04-05 NOTE — Progress Notes (Signed)
 Initial Wellness VisitVisit performed as:    Office Visit, met with patient in personToday we reviewed and updated Veronica Bishop's smoking status, activities of daily living, depression screen, fall risk, medications and allergies. I have counseled the patient in the above areas. Subjective: Chief Complaint: Veronica Bishop is a 68 y.o. female here for a/an Annual Exam and Initial Annual Medicare visitIn general, Veronica Bishop rates their overall health jd:hnniEjupzwu Care Team:Deiss, Pierce, MD as PCP - [facility] ([facility])Cranston, Cheri, MD (Obstetrics and Gynecology) Current Outpatient Medications on File Prior to Visit Medication Sig Dispense Refill  tolterodine  (DETROL  LA) 4 mg 24 hr capsule TAKE 1 CAPSULE BY MOUTH EVERY DAY SWALLOW WHOLE, DO NOT BREAK, CRUSH OR CHEW 90 capsule 0  VITAMIN D  PO Take by mouth.    probiotic (ALIGN) capsule Take 1 capsule by mouth daily.    naproxen sodium (ANAPROX) 220 MG tablet Take 1 tablet (220 mg total) by mouth as needed.    aspirin 81 MG tablet Take 1 tablet (81 mg total) by mouth daily.    EPINEPHrine  (EPIPEN ) 0.3 mg/0.3 mL auto-injector INJECT 0.3ML INTRAMUSCULARLY AS DIRECTED 2 each 1 No current facility-administered medications on file prior to visit. 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, right 05/20/2014   PVD with VH and retinal tears--s/p  barrier LPC OD (05/20/14)--s/p barrier LPC OD (05/21/14)--s/p barrier LPC OD (05/24/14) --doing great, no new heme or tears--f/u 1 yr OCT OU, dilated OU--SSX of RD explained and understoodChronic pseudophakic ME OS--hx of RD repair x 3 (2011), now resolvedHx RD repair OS--RD repair x  3 (2011)--See abovePCIOL OS--PCO OS, poor visual prognosis, follow  Dysuria 01/09/2014  NASH (nonalcoholic steatohepatitis) 12/28/2013  Joint pain, foot 01/02/2013  Plantar fasciitis of right foot 01/02/2013  Contracture, Achilles tendon 01/02/2013  Incomplete bladder emptying 08/22/2012  Interstitial cystitis 02/25/2012  Recurrent UTI 02/25/2012  IC (interstitial cystitis) 06/30/2011  OAB (overactive bladder) 06/30/2011  Refractive error 03/27/2011  Cystoid macular edema of left eye 02/20/2011   Chronic pseudophakic ME OS HX OF RD repair X 3 (3/11), now resolvedF/u 12 month OCT and for re-evaluationPVD OD--no tearsHx RD repair OSRD repair X 3 (3/11)See abovePCIOL OS-PCO OS, follow for nowLattice OD- no tears, SSx of RD explained and understood  Cataract  Past Medical History: Diagnosis Date  Cataract   Chronic kidney disease   IC (interstitial cystitis)   Macular edema   PERSISITENT PSEUDOPHAKIC OS  PVD (posterior vitreous detachment), right 05/21/2014  Refractive error 03/27/2011  Varicella   Vitreous hemorrhage 05/21/2014 Past Surgical History: Procedure Laterality Date  25G PPV/IVT/AFX/EL/22%SF6 OS  05/16/2009  Avastin  #1 OS 08/14/11 consent 08/14/11 maxitrol    barrier laser, right eye  05/20/2014  Dr Sheree Moras  BARRIER LPC OS  07/30/2009  BUNIONECTOMY    Hallux Valgus (Bunion) Correction Conversion Data   CATARACT REMOVAL  10/24/2009  PC IOL OS  CESAREAN SECTION, UNSPECIFIED    Cesarean Section Conversion Data   HX TONSILLECTOMY/ADENOIDECTOMY    Tonsillectomy Conversion Data    INTRA VITREAL TRIESENCE OS  05/30/2010  4MG   PHACO/IOL/25G PPV/PCOX/MP/AFX/ICG/ILMX/14% C3 F8 OS  10/24/2009  PR HYSTEROSCOPY BX ENDOMETRIUM&/POLYPC W/WO D&C N/A 09/17/2023  Procedure: HYSTEROSCOPY, WITH DILATION AND CURETTAGE OF UTERUS, POLYPECTOMY, WITH AVETA;  Surgeon: Shiela Perkins, DO;  Location[facility];  Service: OBGYN  RETINAL DETACHMENT SURGERY    X 3  SB/20G PPV/MO/PFC/RTX/AFX/15% C3 F8 OS  06/20/2009 Family History Problem Relation Name Age of Onset  Glaucoma Mother    Stroke Mother    Glaucoma Father    Cataracts Father    Retinal detachment Father    Diabetes Father    Heart Disease Father    Stroke Father    High Blood Pressure Sister    Conversion Other        [id number]^Ipjazuzd Mellitus^250.00^Active^  Conversion Other        [id number]^Active^  Conversion Other        [id number]^Hypertension^401.9^Active^ Social History Socioeconomic History  Marital status: Married Tobacco Use  Smoking status: Former   Types: Cigarettes   Quit date: 02/15/1981   Years since quitting: 43.1  Smokeless tobacco: Never Substance and Sexual Activity  Alcohol use: Yes   Comment: on weekends  Drug use: No  Sexual activity: Yes   Partners: Male Objective: Vital Signs: BP 126/78   Pulse 73   Temp 36.4 C (97.6 F) (Temporal)   Ht 1.562 m (5' 1.5)   Wt 88 kg (194 lb 1.6 oz)   LMP 06/01/2013   SpO2 96%   Breastfeeding No   BMI 36.08 kg/m  BMI: Body mass index is 36.08 kg/m.Vision Screening Results (Welcome visit only):No results found.Depression Screening Results:Review Flowsheet    03/31/2023 07/05/2020 06/11/2020 03/06/2019 07/12/2018 07/06/2018 PHQ Scores PSQ2 Q1 - Interest/Pleasure - - - N N N PSQ2 Q2 - Down, Depressed, Hopeless - - - N N N PHQ Calculated Score 0 0 0 - - - Promis Cat V1.0 - Depression  04/04/2024 11:19 AM EDT - Filed by Patient I felt depressed Sometimes I  felt hopeless Rarely I felt worthless Never I felt helpless Never PROMIS Depression T-Score (range: 10 - 90) 54 (within normal limits)  Opioid Use/DAST- 10 Screening Results: How many times in the past year have you used an illegal drug or used a prescription medication for nonmedical reasons?: (Patient-Rptd) 0 (04/04/2024 11:20 AM)Activities of Daily Living/Functional Screening Results:Is this person blind or does he/she have serious difficulty seeing even when wearing glasses?: Y (04/05/2024  8:27 AM)*Vision Status: Visual aid  (04/05/2024  8:27 AM)Does this person have serious difficulty walking or climbing stairs?: N (04/05/2024  8:27 AM)Does this person have difficulty dressing or bathing?: N (04/05/2024  8:27 AM)*Shopping: Independent (04/05/2024  8:27 AM)*House Keeping: Independent (04/05/2024  8:27 AM)*Managing Own Medications: Independent (04/05/2024  8:27 AM)*Handling Finances: Independent (04/05/2024  8:27 AM)Difficulty doing errands due to a physicial, mental or emotional condition: No (04/05/2024  8:27 AM)Difficulty remembering or making decisions due to a physicial, mental or emotional condition: No (04/05/2024  8:27 AM)Fall Risk Screening Results:Have you fallen in the last year?: Yes (04/05/2024  8:28 AM)Did you sustain an injury which required medical attention?: No (04/05/2024  8:28 AM)Do you feel you are at risk for falling?: No (04/05/2024  8:28 AM)Assessment and Plan: Cognitive Function:Recall of recent and remote events appears:Normal  Advanced Care Planning:was discussed and patient received paperwork to review The following health maintenance plan was reviewed with the patient:Health Maintenance Topics with due status: Overdue   Topic Date Due  HIV Screening [place] Never done  IMM-MMR (  1-18 YRS or Born [id number]) Never done  COVID-19 Vaccine 04/25/2023 Health Maintenance Topics with due status: Not Due    Topic Last Completion Date  Breast Cancer Screening USPSTF 06/15/2022  Colon Cancer Screening Other 11/16/2022  IMM DTaP/Tdap/Td 03/31/2023  IMM-Influenza 07/23/2023  Cervical Cancer Screening Other 08/23/2023  Depression Screen Yearly 04/05/2024  Fall Risk Screening 04/05/2024 Health Maintenance Topics with due status: Completed   Topic Last Completion Date  Hepatitis C Screening USPSTF/[place] 10/17/2013  IMM-Zoster 01/16/2022  IMM Pneumo: 50+ Years 03/31/2023  Osteoporosis Screening USPSTF 05/06/2023 Health Maintenance Topics with due status: Aged [employer] Date Due  IMM-Hepatitis B Vaccine Aged Out  IMM-HIB 0-5 Yrs or At-Risk Patients Aged Out  IMM-HPV 9-26 Yrs or Shared Decision (27-45 Yrs) Aged Out  IMM-MCV4 0-18 Yrs or At-Risk Patients Aged Out  IMM-Rotavirus 0-8 Months Aged Out  IMM-MenB (2 Plans: Shared decision & Increased Risk Plans) Aged Out This health maintenance schedule, identified risks, a list of orders placed today and patient goals have been provided to Cy Jansky Killough in the after visit summary. Plan for any concerns identified during screening or risk assessments:Mammogram ordered. Sabine Tenenbaum PA-CFamily Medicine

## 2024-04-05 NOTE — Progress Notes (Signed)
 [facility] [place]: Annual Physical ExamComplete History & Physical Exam: Author: Isbella Arline, PAPCP: Deiss, Katharine, MDSUBJECTIVE: Chief Complaint Patient presents with  Annual Exam  Initial Annual Medicare visit See separate note for initial wellness visit.History of Present Illness:Veronica Bishop is a 68 y.o.female presents to the office today for annual comprehensive history and physical. Today we discussed:History of Present IllnessShe reports feeling well overall but has been under significant stress this year due to personal issues with her daughter. She rates her overall health as fair to good. She is up to date with dental care and needs to schedule a mammogram. She had a bone density test on 08/02/2023 and is unsure how often it should be repeated. She has completed her colonoscopy. She has been sleeping better recently. She takes vitamin D  supplements and baby aspirin daily.  She has noticed increased gum bleeding, which she attributes to her daily baby aspirin intake. She flosses regularly due to tightly spaced teeth. She reports no abdominal issues, changes in stool, or blood in stool.She has been on Detrol  since her 30s for bladder issues, which she finds helpful. She has a history of recurrent UTIs and overactive bladder, for which she was previously on daily antibiotics. She was also diagnosed with interstitial cystitis (IC).She experiences numbness and tingling in her right wrist, which has been ongoing for years. The symptoms are intermittent and worsen with stress or overuse. She often wakes up in the middle of the night with numbness in her hand. She uses a brace, which provides some relief. She believes she has similar symptoms in both hands, but the right hand is more affected. She has been living with this condition for at least 10 years. She uses Voltaren gel for relief and takes Advil  as needed.She experiences occasional  heartburn, which she believes is related to her weight. She was previously on medication for acid reflux but has since weaned herself off and only takes over-the-counter remedies as needed. She reports no chest pain or shortness of breath with exertion.She admits to not exercising as much as she should but maintains an active lifestyle. She has gained about 11 pounds since last year and aims to lose 40 pounds. Her diet varies depending on her work schedule, with healthier choices when she is not working. She is trying to reduce her intake of sweets and increase her fruit consumption. She occasionally drinks soda or iced tea but primarily drinks water. She usually has a cup of tea in the morning. She is considering metformin  for weight loss.Hyperlipidemia/risk modification:Last Lipids:   Lab results: 08/11/250904 Cholesterol 251* HDL 59 LDL Calculated 160* Triglycerides 179* Chol/HDL Ratio 4.3 No components found with this basename: NHLDCThe 10-year ASCVD risk score (Arnett DK, et al., 2019) is: 6.5%  Values used to calculate the score:    Age: 59 years    Sex: Female    Is Non-Hispanic African American: No    Diabetic: No    Tobacco smoker: No    Systolic Blood Pressure: 126 mmHg    Is BP treated: No    HDL Cholesterol: 59 mg/dL    Total Cholesterol: 748 mg/dLSafety/Social History:-Living situation: Lives with spouse and dogs-Smoke detectors present Yes-Wears seatbelt yes-Wears cycling helmet yes-Dental care:  done, discussed-Eye care:  done, discussed-Exercise: No formal regimen-Caffeine: 1 tea daily-Sleep: No issues, sleep has improved-Domestic violence noThe patient's medications, allergies, medical history, surgical history, family history, social history were reviewed and reconciled with Cy at the time of their appointment  and updated in the appropriate sections of eRecord.Problems:Patient Active Problem List  Diagnosis Code  Cataract 366  Cystoid macular edema of left eye H35.352  Refractive error H52.7  IC (interstitial cystitis) N30.10  OAB (overactive bladder) N32.81  Interstitial cystitis N30.10  Recurrent UTI N39.0  Incomplete bladder emptying R33.9  Joint pain, foot M25.579  Plantar fasciitis of right foot M72.2  Contracture, Achilles tendon M67.00  NASH (nonalcoholic steatohepatitis) K75.81  Dysuria R30.0  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, right H33.311  Vitreous hemorrhage H43.10  PVD (posterior vitreous detachment), right H43.811  Pelvic floor dysfunction M62.89  Uterine leiomyoma D25.9  Status post hysteroscopic polypectomy Z98.890  Mixed hyperlipidemia E78.2  Prediabetes R73.03 Past Medical/Surgical History: Past Medical History[1]Past Surgical History[2]Family History:  Family History[3]Social/Occupational History: Social History Socioeconomic History  Marital status: Married Tobacco Use  Smoking status: Former   Types: Cigarettes   Quit date: 02/15/1981   Years since quitting: 43.1  Smokeless tobacco: Never Substance and Sexual Activity  Alcohol use: Yes   Comment: on weekends  Drug use: No  Sexual activity: Yes   Partners: Male Review of Systems:Constitutional: Negative for fever, chills, night sweats, fatigue, unexplained weight loss.Eyes: Negative for vision changes, blurred vision, flashes or floaters, double vision. ENMT: Negative for sore throat, nasal congestion, epistaxis, ear pain, hearing loss, tinnitus, mouth lesions.Respiratory: Negative for shortness of breath, cough, wheeze, hemoptysis.Cardiovascular: Negative for chest pain, chest pressure, palpitations, dyspnea on exertion, orthopnea, lower extremity edema, near syncope.  Gastrointestinal: Negative for abdominal pain, change in bowel habits, constipation, diarrhea, melena, hematochezia, nausea, vomiting, heartburn, reflux  symptoms.Genitourinary: Negative for dysuria, frequency, hematuria, urinary incontinence.Skin: Negative for rash, lumps, masses, change in moles, skin irritation.Hematologic/Lymphatic: Negative for bleeding, easy bruising, lymphadenopathy.Musculoskeletal: Negative for arthralgias, neck pain, back pain, focal muscle weakness, joint swelling or deformity.Neurological: Positive for numbness/tingling/pain of right wrist and fingers.  Negative for coordination problems, dizziness, vertigo, headaches, weakness, falls, memory problems, or sleep disturbances.Behavioral/Psychiatric: Negative for anxiety, panic attacks, depression, suicidal thoughts, homicidal thoughts.Endocrine: Negative for polydipsia, polyphagia, polyuria, unintentional weight changes, heat or cold intolerance.Allergic/Immunologic: Negative for anaphylaxis, angioedema, seasonal allergies, hives, or frequent illness.OBJECTIVE: Physical Exam:Vitals:  04/05/24 0829 BP: 126/78 Pulse: 73 Temp: 36.4 C (97.6 F) TempSrc: Temporal SpO2: 96% Weight: 88 kg (194 lb 1.6 oz) Height: 1.562 m (5' 1.5) Constitutional: Well-developed, well-nourished female awake and alert, in no acute distress. Answers questions appropriately, appears stated age.HEENT: Normocephalic and atraumatic. Eyes are without scleral icterus or injection, conjunctivae pink without exudates. PERRLA, EOMI. Tympanic membranes are normal bilaterally with good light reflex and normal landmarks, no erythema, bulging, or retraction. Nares are patent with midline and intact septum, no nasal polyps, rhinorrhea, or edema of inferior turbinates. Mouth has moist mucus membranes, posterior pharynx is non-erythematous. Uvula midline, palate rises symmetrically. Tongue is midline and without coating.Neck: Supple, no lymphadenopathy, normal range of motion. Thyroid  is symmetrical with no thyromegaly, tenderness, or nodules palpated. Lungs: Clear to auscultation  bilaterally with no rales, wheezes, or rhonchi.Cardiovascular: Regular rate and rhythm. Normal S1/S2, no murmurs, gallops, or rubs.Abdomen: Normal bowel sounds in all 4 quadrants with no renal or aortic bruits. Abdomen is nontender to light and deep palpation with no masses, guarding, or rigidity. No hepatosplenomegaly. Neurological: Alert & oriented x3. CN II-XII intact by observation. Motor strength normal and symmetric in all major muscle groups of bilateral upper and lower extremities. Gait normal, coordination normal. Sensation to light touch normal and symmetric of bilateral upper and lower extremities. (+) Tinel testing at right wrist.Musculoskeletal: Normal  range of motion of all major joints of upper and lower extremities. Gait is non-antalgic.Extremities: No edema. Pedal pulses intact 2+ bilaterally. No cyanosis or clubbing. Skin: Skin is warm and dry. No pallor, cyanosis, ecchymosis, rashes, or lesions.Psych: Normal mood, congruent affect, normal eye contact. Speech is articulate and fluid. No signs of anxiety. Recent Lab Results:Results for orders placed or performed during the hospital encounter of 04/03/24 Vitamin D  Result Value Ref Range  25-OH Vit Total 43 30 - 60 ng/mL TSH Result Value Ref Range  TSH 1.50 0.27 - 4.20 uIU/mL Hemoglobin A1c Result Value Ref Range  Hemoglobin A1C 5.9 (H) <=5.6 % Lipid Panel (Reflex to Direct  LDL if Triglycerides more than 400) Result Value Ref Range  Cholesterol 251 (!) mg/dL  Triglycerides 820 (!) mg/dL  HDL 59 40 - 60 mg/dL  LDL Calculated 839 (!) mg/dL  Non HDL Cholesterol 807 mg/dL  Chol/HDL Ratio 4.3  Comprehensive metabolic panel Result Value Ref Range  Sodium 141 133 - 145 mmol/L  Potassium 4.4 3.3 - 5.1 mmol/L  Chloride 102 96 - 108 mmol/L  CO2 27 20 - 28 mmol/L  Anion Gap 12 7 - 16  UN 16 6 - 20 mg/dL  Creatinine 9.25 9.48 - 0.95 mg/dL  eGFR BY CREAT 89   Glucose 94 60 - 99 mg/dL   Calcium 9.9 8.6 - 89.7 mg/dL  Total Protein 6.6 6.3 - 7.7 g/dL  Albumin 4.3 3.5 - 5.2 g/dL  Bilirubin,Total 0.5 0.0 - 1.2 mg/dL  AST 21 0 - 35 U/L  ALT 19 0 - 35 U/L  Alk Phos 84 35 - 105 U/L CBC and differential Result Value Ref Range  WBC 5.7 3.5 - 11.0 THOU/uL  RBC 4.5 4.0 - 5.5 MIL/uL  Hemoglobin 14.1 11.2 - 16.0 g/dL  Hematocrit 42 34 - 49 %  MCV 93 75 - 100 fL  RDW 12.9 0.0 - 15.0 %  Platelets 272 150 - 450 THOU/uL  Seg Neut % 47.9 %  Neut # K/uL 2.7 1.5 - 6.5 THOU/uL  Lymph # K/uL 2.4 1.0 - 5.0 THOU/uL  Mono # K/uL 0.4 0.1 - 1.0 THOU/uL  Eos # K/uL 0.1 0.0 - 0.5 THOU/uL  Baso # K/uL 0.0 0.0 - 0.2 THOU/uL  IMM Granulocytes # 0.0 THOU/uL  IMM Granulocytes 0.0 % Health Maintenance:Health Maintenance: These screening recommendations are based on USPSTF, [employer], and [place] state guidelines Topic Date Due  HIV Screening  Never done  MMR Vaccine (1 of 1 - Standard series) Never done  COVID-19 Vaccine (4 - 2024-25 season) 04/25/2023  Flu Shot (1) 04/24/2024  Breast Cancer Screening  06/15/2024  Cervical Cancer Screening  08/22/2024  Depression - Yearly  04/05/2025  Fall Risk Screening  04/05/2025  Colon Cancer Screening - Other  11/15/2029  DTaP/Tdap/Td Vaccines (3 - Td or Tdap) 03/30/2033  Shingles Vaccine  Completed  Hepatitis C Screening  Completed  Osteoporosis Screening  Completed  Pneumococcal Vaccination  Completed  Hepatitis B Vaccine  Aged Out  HIB Vaccine  Aged Out  HPV Vaccine  Aged Out  Meningococcal Vaccine  Aged Out  Rotavirus Vaccine  Aged Out  Meningitis Vaccine  Aged Out Immunizations:Immunization History Administered Date(s) Administered  Covid-19 mRNA vaccine (MODERNA) IM 100 mcg/0.5 mL 11/11/2019, 12/09/2019, 07/22/2020  Influenza Adjuvanted Trivalent PF Vaccine, 65y+ 07/23/2023  Influenza Inj Quad Historical(aka FLU,unspecified) 08/04/2021  Influenza Quad 0.26mL prefilled  syringe/single dose vial (FluLaval,Fluzone,Afluria,Fluarix)Historical 07/06/2018, 07/05/2020  Influenza Quad Adjuvanted prefilled syringe (FluAd) 42yrs+ historical 06/15/2022  Influenza Quad Cell-Based prefilled syringe (Flucelvax) 71mo+ Historical 07/24/2021  Influenza multi-dose vial historical 08/21/2013  Pneumococcal 20-valent Conj vaccine 03/31/2023  Tdap 09/10/2010, 03/31/2023  Zoster(Shingrix) 07/24/2021, 01/16/2022  Zoster(Zostavax) 10/22/2013 Specialists:Care Team          Deiss, Katharine, MD PCP - General [phone]  [place], [facility] [phone]  Cordie Ensign, MD Obstetrics and Gynecology [phone]  ASSESSMENT & PLAN: 1. Preventative health care (Primary)See separate note for initial wellness visit.2. Annual visit for general adult medical examination with abnormal findingsHealth maintenance recommendations reviewed in detail at today's appointment.  See health maintenance section of chart with details/updates. Vaccines reviewed and updated as indicated. Her bone density test from 08/02/2023 showed no signs of osteopenia or osteoporosis, indicating a normal result. Her colonoscopy results are also satisfactory. Her electrolyte, kidney, and liver function tests are within normal limits. Her vitamin D  levels are optimal, thyroid  function is normal. Her eye exam is current. A mammogram order will be sent to [facility]. A healthcare proxy form will be provided for her to complete and return.Counseled on AHA step I diet and regular exercise Discussed importance of adequate dietary Calcium intakeRecommended taking Vit D 1000-2000 international units dailyCounseled regarding safety, seat belts, helmets, and common causes of morbidity/mortality in this patient's age groupCounseled about risks of skin cancer and importance of sunscreen protection (USPSTF recommends ages 77-24 with fair skin limit UV  exposure)Counseled on alcohol use (CDC recommends limiting intake to <2 drinks/day for men or <1 drink/day for women)Advance directives discussed as warranted3. Mixed hyperlipidemia- Her cholesterol levels have increased since last year, with a total cholesterol of 251, triglycerides at 179, and LDL above 100. - ASCVD risk score 6.5%, placing her in the low to moderate risk category. Statin therapy not indicated at this time.- Weight loss and dietary modifications were discussed as potential strategies to improve her cholesterol panel.4. Prediabetes- Her A1c level is 5.9, indicating prediabetes. - The possibility of using metformin  to prevent progression to diabetes was discussed, along with the potential side effects of abdominal cramps or diarrhea. The use of weight loss medications was also considered. - Dietary modifications, including reducing processed foods and limiting excess carbohydrates, were recommended to help manage her A1c levels. - A prescription for metformin  500 mg extended release, to be taken once daily with meals, was provided and sent to pharmacy.- Fasting blood work will be ordered for her next visit in mid-November 2025.5. OAB (overactive bladder)- She has been taking Detrol  for overactive bladder since her 30s and finds it helpful.6. Numbness and tingling in right hand- Her symptoms suggest carpal tunnel syndrome, likely due to overuse and strain over time. - EMG will be ordered to confirm the diagnosis and assess its severity. - She was advised to continue using the brace if it provides relief. The use of Voltaren gel was also suggested. - Depending on the results of the nerve test, a referral to orthopedics may be considered.7. Encounter for screening mammogram for malignant neoplasm of breast- Mammography screening BILATERAL; FutureOrders:Orders Placed This Encounter  Mammography screening BILATERAL  Comprehensive metabolic  panel  Lipid Panel (Reflex to Direct  LDL if Triglycerides more than 400)  Hemoglobin A1c  EMG  metFORMIN  (GLUCOPHAGE -XR) 500 mg 24 hr tablet Follow up: Follow up in 3 months or sooner if needed.Future Appointments Date Time Provider Department Center 05/05/2024  2:30 PM NEURO, EMG [facility] NME None 07/07/2024  9:30 AM Tamilyn Lupien, Edsel SAUNDERS, PA [facility] / [facility] None 11/07/2024 10:30 AM Diloreto, David, MD  [facility] None The following patient instructions were reviewed verbally with the patient, printed on their after visit summary and given to them at the end of today's visit: Patient Instructions Please call the imaging office to schedule your Mammography screening BILATERAL  as soon at possible.[facility]: [phone] & [facility]: [phone]: [phone]: [phone] or [phone]: [phone]: [phone]: [phone]: [phone] [phone] going outside of the [facility] please let us  know as the other offices require us  to manually fax over the order requisition.Healthy Living Diet Recommendations:Diet should be lower in calories, limit carbohydrates/starches (bread, pasta, rice, potatoes, cakes, cookies, and white foods). Reduce intake of saturated fats, processed foods, refined sugars/fructose, and inflammatory seed oils. Increase fresh veggies and fruits in diet and get 150 minutes of moderate exercise per week. The Mediterranean Diet is a good choice. http://www.wall-moore.info/ for some basic nutritional information and information about portion sizes. Atkins.com Carbohydrate counter tool, meal plans and recipes, and more free resources https://www.atkins.com/how-it-works/free-toolsApps: Programmer, systems (https://www.supertracker.[place]da.gov/)MyFitnessPalWeightWatchers (subscription  required)Noom (subscription required)Thank you for completing your Annual Exam and Initial Annual Medicare visit with us  today. The purpose of this visits was un:Drmzzw for diseaseAssess risk of future medical problemsHelp develop a healthy lifestyleUpdate vaccinesGet to know your doctor in case of an illnessPatient Care Team:Deiss, Katharine, MD as PCP - GeneralRochester, [facility] ([facility])Cranston, Cheri, MD (Obstetrics and Gynecology) Medicare 5 Year PlanThe following items were identified as areas of concern during your screening today:BMI greater than 25 - This is a risk for Heart Attack, Stroke, High Blood Pressure, Diabetes, High Cholesterol and other complications. The Health Maintenance table below identifies screening tests and immunizations recommended by your health care team:Health Maintenance: These screening recommendations are based on USPSTF, [employer], and [place] state guidelines Topic Date Due  HIV Screening  Never done  MMR Vaccine (1 of 1 - Standard series) Never done  COVID-19 Vaccine (4 - 2024-25 season) 04/25/2023  Flu Shot (1) 04/24/2024  Breast Cancer Screening  06/15/2024  Cervical Cancer Screening  08/22/2024  Depression - Yearly  04/05/2025  Fall Risk Screening  04/05/2025  Colon Cancer Screening - Other  11/15/2029  DTaP/Tdap/Td Vaccines (3 - Td or Tdap) 03/30/2033  Shingles Vaccine  Completed  Hepatitis C Screening  Completed  Osteoporosis Screening  Completed  Pneumococcal Vaccination  Completed  Hepatitis B Vaccine  Aged Out  HIB Vaccine  Aged Out  HPV Vaccine  Aged Out  Meningococcal Vaccine  Aged Out  Rotavirus Vaccine  Aged Out  Meningitis Vaccine  Aged Out In addition, goals and orders placed to address these recommendations are listed in the Today's Visit section.We wish you the best of health and look forward to seeing you again next year for  your Annual Medicare Wellness Visit. If you have any health care concerns before then, please do not hesitate to contact us . SIGNED:Berklie Dethlefs, PA-CFamily MedicineThis note has been dictated using Animal nutritionist.  Reasonable attempts to correct typing mistakes have been done.  Please excuse any inherent inaccuracy that might have escaped.  [1] Past Medical History:Diagnosis Date  Cataract   Chronic kidney disease   IC (interstitial cystitis)   Macular edema   PERSISITENT PSEUDOPHAKIC OS  PVD (posterior vitreous detachment), right 05/21/2014  Refractive error 03/27/2011  Varicella   Vitreous hemorrhage 05/21/2014 [2] Past Surgical History:Procedure Laterality Date  25G PPV/IVT/AFX/EL/22%SF6 OS  05/16/2009  Avastin  #1 OS 08/14/11 consent 08/14/11 maxitrol    barrier laser, right eye  05/20/2014  Dr Sheree Moras  BARRIER LPC OS  07/30/2009  BUNIONECTOMY    Hallux Valgus (Bunion) Correction Conversion Data   CATARACT REMOVAL  10/24/2009  PC IOL OS  CESAREAN SECTION, UNSPECIFIED    Cesarean Section Conversion Data   HX TONSILLECTOMY/ADENOIDECTOMY    Tonsillectomy Conversion Data   INTRA VITREAL TRIESENCE OS  05/30/2010  4MG   PHACO/IOL/25G PPV/PCOX/MP/AFX/ICG/ILMX/14% C3 F8 OS  10/24/2009  PR HYSTEROSCOPY BX ENDOMETRIUM&/POLYPC W/WO D&C N/A 09/17/2023  Procedure: HYSTEROSCOPY, WITH DILATION AND CURETTAGE OF UTERUS, POLYPECTOMY, WITH AVETA;  Surgeon: Shiela Perkins, DO;  Location: [facility];  Service: OBGYN  RETINAL DETACHMENT SURGERY    X 3  SB/20G PPV/MO/PFC/RTX/AFX/15% C3 F8 OS  06/20/2009 [3] Family HistoryProblem Relation Name Age of Onset  Glaucoma Mother    Stroke Mother    Glaucoma Father    Cataracts Father    Retinal detachment Father    Diabetes Father    Heart Disease Father    Stroke Father    High Blood Pressure Sister    Conversion Other        [id number]^Ipjazuzd  Mellitus^250.00^Active^  Conversion Other        [id number]^Active^  Conversion Other        [id number]^Hypertension^401.9^Active^

## 2024-04-06 ENCOUNTER — Telehealth: Payer: Self-pay

## 2024-04-06 NOTE — Telephone Encounter (Signed)
 Called patient to schedule EMG/RUE Numbness and tingling suspect CTS

## 2024-04-18 ENCOUNTER — Other Ambulatory Visit: Payer: Self-pay | Admitting: Gastroenterology

## 2024-04-21 ENCOUNTER — Encounter: Payer: Self-pay | Admitting: Medical

## 2024-05-04 ENCOUNTER — Other Ambulatory Visit: Payer: Self-pay

## 2024-05-05 ENCOUNTER — Ambulatory Visit: Payer: Medicare (Managed Care) | Attending: Oncology

## 2024-05-05 DIAGNOSIS — R200 Anesthesia of skin: Secondary | ICD-10-CM | POA: Insufficient documentation

## 2024-05-05 DIAGNOSIS — R202 Paresthesia of skin: Secondary | ICD-10-CM | POA: Insufficient documentation

## 2024-05-05 DIAGNOSIS — G5601 Carpal tunnel syndrome, right upper limb: Secondary | ICD-10-CM | POA: Insufficient documentation

## 2024-05-05 NOTE — Procedures (Signed)
 Exam location: [address] Attending physician: Dr. Manus Garland Patient: Veronica Bishop, Veronica Bishop  Patient ID: [id number]  Date of Birth: [birth year] Height: 0'0 Gender: Female Report ID: [id number] Exam Date: 05/05/2024 Referring Physician(s):  Danielle Sauvageau, PA / Dr. Pierce Ache Provider(s):  Dr. Manus Garland / Ismael Sheen Referring Diagnosis:  Median Neuropathy at the Wrist, right upper limb Final Diagnosis:  Median Neuropathy at the Wrist, right upper limb Patient History [name] is a 68 year old woman who was referred by Edsel Fleck, PA, for electrodiagnostic evaluation of right upper extremity numbness.   [name] reports that for the past 10 years she has been experiencing intermittent numbness at digits 1-4 and intermittent pain at the wrist. She also reports wearing a wrist brace when symptoms are intolerable for the past 2 years and nighttime paresthesia.  She reports difficulty performing activities she previously enjoyed including bicycling and kayaking due to symptoms.    A time out procedure was completed to verify the patient's identity including first and last name, date of birth and confirmed with id band. The correct procedure and site were reviewed with the patient.  Verbal consent was obtained prior to testing.  During performance of nerve conduction studies, distal limb temperature was maintained between 32 to 36 degrees Centigrade. Clinical Examination Negative Tinel's at bilateral wrists. Negative Finkelstein's bilaterally. Motor: Normal muscle bulk and 5/5 strength throughout bilateral upper extremities.   Muscle:  R  L  Upper extremity  Shoulder abduction  5  5  Shoulder forward flexion  5  5  Elbow flexion  5  5  Elbow extension  5  5  Wrist flexion  5  5  Wrist extension  5  5  Finger extension  5  5  Finger spread  5  5  Thumb flexion (FPL)  5  5  Thumb abduction  5  5   Sensory:   Reduced pinprick at right digit 2 relative to digit 5.   Reflexes:  R  L  Biceps  2  2  Triceps  2  2  Brachioradialis  2  2    Motor Nerve Conduction Nerve and Stimulus Site  Onset Latency  Amplitude  Conduction Velocity  Distance  Normal  Median.R/APB  Wrist  4.9 ms  9.9 mV       70 mm  no  Elbow  8.6 ms  8 mV  51 m/s  190 mm  yes  Ulnar.R/ADM  Wrist  2.4 ms  13.7 mV       70 mm  yes  Bel elbow  5.3 ms  12.7 mV  65 m/s  185 mm  yes  Ab elbow  7 ms  11.5 mV  58 m/s  100 mm  yes  Sensory Nerve Conduction Nerve and Stimulus Site  Segment  Onset Latency  Amplitude  Conduction Velocity  Distance  Normal  Median (Dig II).R  Mid palm  Mid palm-Dig II  1 ms  19.2 uV  56 m/s  55 mm  no  Wrist  Wrist-Dig II  3.8 ms  12.7 uV  34 m/s  130 mm  no       Wrist-Mid palm            27 m/s  75 mm  no  Ulnar (Dig V).R  Wrist  Wrist-Dig V  2.2 ms  13.3 uV  51 m/s  110 mm  yes      Interpretation & Conclusions  This is an abnormal study.   There is electrodiagnostic evidence of a right sided moderate median neuropathy at the wrist (i.e. carpal tunnel syndrome).   There is no electrodiagnostic evidence of a right sided ulnar neuropathy at the elbow.   As the teaching physician identified below, I was physically present for the key portions of the electrodiagnostic examination and I prepared the interpretation at the time of the examination.  Signature This report signed electronically by  Dr. Manus Garland on 05/05/2024 5:26 PM   Dr. Manus Garland / Ismael Sheen

## 2024-05-06 ENCOUNTER — Encounter: Payer: Self-pay | Admitting: Primary Care

## 2024-05-06 DIAGNOSIS — G5611 Other lesions of median nerve, right upper limb: Secondary | ICD-10-CM | POA: Insufficient documentation

## 2024-05-10 ENCOUNTER — Telehealth: Payer: Self-pay | Admitting: Primary Care

## 2024-05-10 NOTE — Telephone Encounter (Signed)
 Patient called. Pt reports waking up to a red itchy rash on her chest yesterday. Pt reports slight HA, worse on Tues, denies fever, denies pain, denies N/V, endorses fatigue. Pt denies rash spreading, but is very itchy.Pt was in [place] three weeks ago.Pt would like appt with PCP or PA. RN did advise pt to leave skin clean and dry. May try baking soda and water paste for comfort.Pt V/U . Transferred to front desk for scheduling.

## 2024-05-10 NOTE — Telephone Encounter (Signed)
 Call transferred up front.Patient is now scheduled with PC tomorrow at 11:00 am.She is all set.

## 2024-05-10 NOTE — Telephone Encounter (Signed)
 RashTemperature greater than 100.4 F?noIs the rash on face and near eye? noAdditional information:When did symptoms start? 9/16/2025Painful? noItchy? yesSpreading? yesBlistering? noDid you try anything to lessen the symptoms? NO  Patient called state that she woke up and she had a rash on her chest its very itchy and started to spread. Patient wanted to know if a nurse could give her a call back before she schedule apptioinmnet.

## 2024-05-11 ENCOUNTER — Ambulatory Visit: Payer: Medicare (Managed Care) | Admitting: Medical

## 2024-05-12 ENCOUNTER — Encounter: Payer: Self-pay | Admitting: Medical

## 2024-05-12 ENCOUNTER — Other Ambulatory Visit: Payer: Self-pay | Admitting: Primary Care

## 2024-05-12 DIAGNOSIS — G5601 Carpal tunnel syndrome, right upper limb: Secondary | ICD-10-CM

## 2024-05-12 MED ORDER — TOLTERODINE TARTRATE 4 MG PO CP24 *I*
4.0000 mg | ORAL_CAPSULE | Freq: Every day | ORAL | 3 refills | Status: AC
Start: 2024-05-12 — End: ?

## 2024-05-12 NOTE — Telephone Encounter (Signed)
 Last office visit with doctor: 6/9/2025Last office visit with APP: 8/13/2025Patients upcoming appointments:Future Appointments Date Time Provider Department Center 07/07/2024  9:30 AM Sauvageau, Edsel SAUNDERS, PA [facility] None 11/07/2024 10:30 AM Diloreto, Alm, MD [facility] None Recent Lab results:GENERAL CHEMISTRY Recent Labs   08/11/250904 NA 141 K 4.4 CL 102 CO2 27 GAP 12 UN 16 CREAT 0.74 GLU 94 CA 9.9  LIPID PROFILE Recent Labs   08/11/250904 CHOL 251* TRIG 179* HDL 59 LDLC 160*  LIVER PROFILE Recent Labs   08/11/250904 ALT 19 AST 21 ALK 84 TB 0.5  DIABETES THYROID  Recent Labs   08/11/250904 HA1C 5.9*  Recent Labs   08/11/250904 TSH 1.50   Pending/Orders Labs:Lab Frequency Next Occurrence Mammography screening BILATERAL Once 04/05/2024 Comprehensive metabolic panel Once 06/24/2024 Lipid Panel (Reflex to Direct  LDL if Triglycerides more than 400) Once 06/24/2024 Hemoglobin A1c Once 06/24/2024  .pcn

## 2024-05-15 ENCOUNTER — Encounter: Payer: Self-pay | Admitting: Primary Care

## 2024-05-15 ENCOUNTER — Encounter: Payer: Self-pay | Admitting: Medical

## 2024-06-19 ENCOUNTER — Other Ambulatory Visit: Payer: Self-pay

## 2024-06-20 ENCOUNTER — Ambulatory Visit
Payer: Medicare (Managed Care) | Attending: Student in an Organized Health Care Education/Training Program | Admitting: Student in an Organized Health Care Education/Training Program

## 2024-06-20 ENCOUNTER — Other Ambulatory Visit: Payer: Self-pay | Admitting: Student in an Organized Health Care Education/Training Program

## 2024-06-20 ENCOUNTER — Encounter: Payer: Self-pay | Admitting: Student in an Organized Health Care Education/Training Program

## 2024-06-20 VITALS — Ht 62.0 in | Wt 183.0 lb

## 2024-06-20 DIAGNOSIS — G5601 Carpal tunnel syndrome, right upper limb: Secondary | ICD-10-CM | POA: Insufficient documentation

## 2024-06-20 NOTE — Progress Notes (Signed)
 Orthopaedic Hand Surgery History & PhysicalCC: R hand numbness/tingling, painHPI: Veronica Bishop is a 68 y.o. female presenting with complaints of intermittent R hand numbness/tingling and pain for the past couple years. She feels as if her symptoms have gotten significantly worse over the past 6 months. Although she does not have any numbness/tingling at baseline, she reports that it comes on after increased use. For instance, she has has been unable to ride a bike, kayak, or clean her house due to these symptoms. She also states the paresthesias often wake her up at night. She has been using a brace on-and-off for the past few years, but it has not provided sustained relief. She is not interested in trying a steroid injection. She endorses similar symptoms on the L side, but the L hand is much milder and not bothersome at this pointEMG performed on 05/05/24 demonstrated moderate R carpal tunnel syndrome.EFY:Ejdu Medical History[1]Current Outpatient Medications Medication  tolterodine  (DETROL  LA) 4 mg 24 hr capsule  metFORMIN  (GLUCOPHAGE -XR) 500 mg 24 hr tablet  VITAMIN D  PO  probiotic (ALIGN) capsule  EPINEPHrine  (EPIPEN ) 0.3 mg/0.3 mL auto-injector  aspirin 81 MG tablet  naproxen sodium (ANAPROX) 220 MG tablet No current facility-administered medications for this visit.  Past Surgical History[2] Social History[3]ROS: Negative except per HPI Exam:Gen: Alert and oriented. Cooperative with exam.Breathing comfortably on room airR upper Extremity:No obvious deformity. Skin is intact.No tenderness throughout hand, wrist, forearm, elbowFull painless ROM throughout fingers, wrist, elbow5/5 strength throughout. Positive modified Durkan's test, Tinel's at wristNegative Tinel's at elbowSensation intact to light touch throughout radial, median and ulnar distributions. Palpable radial pulse. Brisk capillary refill L upper  extremity:Positive Tinel's at wristNegative modified Durkan's testImaging: EMG 05/05/24 demonstrates moderate R carpal tunnel syndromeAssessment & Plan: Due to persistent symptoms of carpal tunnel syndrome for many years, patient is interested in surgical intervention. Discuss the risks/benefits of carpal tunnel release, which the patient is agreeable/amenable to. Tentatively will plan to undergo surgery on 12/5.Gifford Skiff, MDATTESTATION: I, Fairy Cooler, MD, evaluated the patient. I agree with the resident's findings and plan as documented in the note and have made any necessary corrections.  I personally reviewed the EMG of the right upper extremity which demonstrates electrodiagnostic findings consistent with moderate right carpal tunnel syndrome.  There is increased motor and sensory latency.  She does demonstrate positive provocative findings for carpal tunnel syndrome today.  She has tried multiple months of bracing without significant relief.  We discussed the findings on the EMG consistent with carpal tunnel syndrome as well as treatment options. We discussed the risks and benefits of carpal tunnel release surgery. Risks include infection, bleeding, damage to surrounding structures, pain, incomplete relief of symptoms. We discussed that the patient will have a soft dressing in place for 3 days after surgery. The dressing can be removed thereafter and they can begin washing the hand with warm soapy water. They are to refrain from soaking/ scrubbing the hand as well as refrain from any heavy lifting. They are to practice continuous finger, and wrist ROM exercises post operative to prevent stiffness. They can take over the counter medication as needed for any pain. They will return to the office 10days post op for suture removal and clinical examination.The patient was amenable to proceed with right carpal tunnel release and will be scheduled at their earliest convenience which  will tentatively be scheduled for December 5. The patient understands and agrees with the plan and all questions were answered.Fairy Cooler, MDUniversity of RochesterAssistant Professor of Clinical  OrthopedicsHand, Wrist, and Upper Extremity DivisionOffice #: [phone] fax: [phone]**Note is dictated utilizing Conservation officer, historic buildings. Unfortunately, this leads to occasional typographical errors. I apologize in advance if the situation occurs. If questions occur, please do not hesitate to call our office **  Answers submitted by the patient for this visit:NEW PATIENT VISIT on 06/20/2024  9:15 AM with Fairy Cooler, MDRight hand (Submitted on 06/19/2024)Chief Complaint: Right hand painWhat is your goal for today's visit?: Right HandedDate of onset: : 1/1/2012Was this the result of an injury?: NoWhat is your pain level?: 2/10Please describe the quality of your pain: : aching, discomfort, numbnessWhat diagnostic workup have you had for this condition?: EMGWhat treatments have you tried for this condition?: acetaminophen , bracing, iceProgression since onset: : unchanged since onsetIs this a work related condition? : NoCurrent work status: : usual Automotive Engineer about: Right hand pain (Submitted on 06/19/2024)Chief Complaint: Right hand pain [1] Past Medical History:Diagnosis Date  Cataract   Chronic kidney disease   IC (interstitial cystitis)   Macular edema   PERSISITENT PSEUDOPHAKIC OS  PVD (posterior vitreous detachment), right 05/21/2014  Refractive error 03/27/2011  Varicella   Vitreous hemorrhage 05/21/2014 [2] Past Surgical History:Procedure Laterality Date  25G PPV/IVT/AFX/EL/22%SF6 OS  05/16/2009  Avastin  #1 OS 08/14/11 consent 08/14/11 maxitrol    barrier laser, right eye  05/20/2014  Dr Sheree Moras  BARRIER LPC OS  07/30/2009  BUNIONECTOMY    Hallux Valgus (Bunion)  Correction Conversion Data   CATARACT REMOVAL  10/24/2009  [facility] IOL OS  CESAREAN SECTION, UNSPECIFIED    Cesarean Section Conversion Data   HX TONSILLECTOMY/ADENOIDECTOMY    Tonsillectomy Conversion Data   INTRA VITREAL TRIESENCE OS  05/30/2010  4MG   PHACO/IOL/25G PPV/PCOX/MP/AFX/ICG/ILMX/14% C3 F8 OS  10/24/2009  PR HYSTEROSCOPY BX ENDOMETRIUM&/POLYPC W/WO D&C N/A 09/17/2023  Procedure: HYSTEROSCOPY, WITH DILATION AND CURETTAGE OF UTERUS, POLYPECTOMY, WITH AVETA;  Surgeon: Shiela Perkins, DO;  Location: [facility] OR;  Service: OBGYN  RETINAL DETACHMENT SURGERY    X 3  SB/20G PPV/MO/PFC/RTX/AFX/15% C3 F8 OS  06/20/2009 [3] Social HistoryTobacco Use  Smoking status: Former   Current packs/day: 0.00   Types: Cigarettes   Quit date: 02/15/1981   Years since quitting: 43.3  Smokeless tobacco: Never Substance Use Topics  Alcohol use: Yes   Comment: on weekends  Drug use: No

## 2024-06-20 NOTE — Invasive Procedure Plan of Care (Signed)
 CONSENT FOR MEDICALOR SURGICAL PROCEDURE                            Patient Name: Veronica Bishop [id number] MR                                                              DOB: 14-May-1956     Please read this form or have someone read it to you. It's important to understand all parts of this form. If something isn't clear, ask us  to explain. When you sign it, that means you understand the form and give us  permission to do this surgery or procedure. I agree for Jillian Fairy Ned, MD , and Orthopedic Hand Surgery team along with any assistants* they may choose, to treat the following condition(s): Carpal tunnel syndrome By doing this surgery or procedure on me: Relieving pressure on the nerve in the wrist This is also known as: Carpal tunnel release, open Laterality: Right *if you'd like a list of the assistants, please ask. We can give that to you.1. The care provider has explained my condition to me. They have told me how the procedure can help me. They have told me about other ways of treating my condition. I understand the care provider cannot guarantee the result of the procedure. If I don't have this procedure, my other choices are: Nonoperative management, activity modification2. The care provider has told me the risks (problems that can happen) of the procedure. I understand there may be unwanted results. The risks that are related to this procedure include: Pain, bleeding, infection, anesthesia risks, injury to blood vessels and/or nerves, scar formation, loss of function, stiffness, recurrence and/or incomplete relief of symptoms3. I understand that during the procedure, my care provider may find a condition that we didn't know about before the treatment started. Therefore, I agree that my care provider can perform any other treatment which they think is necessary and available.4. I give permission to the hospital and/or its departments to examine and  keep tissue, blood, body parts, fluids or materials removed from my body during the procedure(s) to aid in diagnosis and treatment, after which they may be used for scientific research or teaching by appropriate persons. If these materials are used for science or teaching, my identity will be protected. I will no longer own or have any rights to these materials regardless of how they may be used.5. My care provider might want a representative from a medical device company to be there during my procedure. I understand that person works for:        The ways they might help my care provider during my procedure include:      6. Here are my decisions about receiving blood, blood products, or tissues. I understand my decisions cover the time before, during and after my procedure, my treatment, and my time in the hospital. After my procedure, if my condition changes a lot, my care provider will talk with me again about receiving blood or blood products. At that time, my care provider might need me to review and sign another consent form, about getting or refusing blood.I understand that the blood is from the community blood supply. Volunteers donated the blood, the volunteers were screened  for health problems. The blood was examined with very sensitive and accurate tests to look for hepatitis, HIV/AIDS, and other diseases. Before I receive blood, it is tested again to make sure it is the correct type.My chances of getting a sickness from blood products are small. But no transfusion is 100% safe. I understand that my care provider feels the good I will receive from the blood is greater than the chances of something going wrong. My care provider has answered my questions about blood products.My decisionabout blood orblood products Not applicable.  My decision about tissueImplants Not applicable.  I understand thisform.My care provideror his/herassistants  haveexplained: What I am having done and why I need it.What other choices I can make instead of having this done.The benefits and possible risks (problems) to me of having this done.The benefits and possible risks (problems) to me of receiving transplants, blood, or blood products.There is no guarantee of the results.The care provider may not stay with me the entire time that I am in the operating or procedure room.My provider has explained how this may affect my procedure. My provider has answered myquestions about this. I give mypermission forthis surgery orprocedure.        _________________________________________                                   My signature(or parent or other person authorized to sign for you, if you are unable to sign for yourself or if you are under 67 years old)       _____________   Date    (MM/DD/YYYY)     _______  Time  Electronic Signatures will display at the bottom of the consent form.Care provider's statement: I have discussed the planned procedure, including the possibility for transfusion of bloodproducts or receipt of tissue as necessary; expected benefits; the possible complications and risks; and possible alternativesand their benefits and risks with the patients or the patient's surrogate. In my opinion, the patient or the patient's surrogateunderstands the proposed procedure, its risks, benefits and alternatives.      Electronically signed by: Fairy Debby Cooler, MD                                          06/20/2024       Date    9:33 AM      Time

## 2024-07-04 ENCOUNTER — Other Ambulatory Visit
Admission: RE | Admit: 2024-07-04 | Discharge: 2024-07-04 | Disposition: A | Discharge status: Home / Self Care | Payer: Medicare (Managed Care) | Source: Ambulatory Visit | Attending: Medical | Admitting: Medical

## 2024-07-04 DIAGNOSIS — R7303 Prediabetes: Secondary | ICD-10-CM | POA: Insufficient documentation

## 2024-07-04 DIAGNOSIS — E782 Mixed hyperlipidemia: Secondary | ICD-10-CM | POA: Insufficient documentation

## 2024-07-04 LAB — COMPREHENSIVE METABOLIC PANEL
ALT: 16 U/L (ref 0–35)
AST: 23 U/L (ref 0–35)
Albumin: 4.2 g/dL (ref 3.5–5.2)
Alk Phos: 99 U/L (ref 35–105)
Anion Gap: 9 (ref 7–16)
Bilirubin,Total: 0.6 mg/dL (ref 0.0–1.2)
CO2: 27 mmol/L (ref 20–28)
Calcium: 9.9 mg/dL (ref 8.6–10.2)
Chloride: 104 mmol/L (ref 96–108)
Creatinine: 0.8 mg/dL (ref 0.51–0.95)
Glucose: 99 mg/dL (ref 60–99)
Lab: 13 mg/dL (ref 6–20)
Potassium: 4.3 mmol/L (ref 3.3–5.1)
Sodium: 140 mmol/L (ref 133–145)
Total Protein: 6.4 g/dL (ref 6.3–7.7)
eGFR BY CREAT: 80

## 2024-07-05 LAB — LIPID PANEL
Chol/HDL Ratio: 3.9
Cholesterol: 226 mg/dL — AB
HDL: 58 mg/dL (ref 40–60)
LDL Calculated: 146 mg/dL — AB
Non HDL Cholesterol: 168 mg/dL
Triglycerides: 125 mg/dL

## 2024-07-05 LAB — HEMOGLOBIN A1C: Hemoglobin A1C: 5.6 % (ref <=5.6)

## 2024-07-06 ENCOUNTER — Other Ambulatory Visit: Payer: Self-pay

## 2024-07-07 ENCOUNTER — Ambulatory Visit: Payer: Self-pay | Admitting: Medical

## 2024-07-07 ENCOUNTER — Ambulatory Visit: Payer: Medicare (Managed Care) | Attending: Primary Care | Admitting: Medical

## 2024-07-07 VITALS — BP 110/70 | HR 81 | Temp 97.3°F | Wt 184.7 lb

## 2024-07-07 DIAGNOSIS — E782 Mixed hyperlipidemia: Secondary | ICD-10-CM

## 2024-07-07 DIAGNOSIS — K5900 Constipation, unspecified: Secondary | ICD-10-CM

## 2024-07-07 DIAGNOSIS — Z23 Encounter for immunization: Secondary | ICD-10-CM

## 2024-07-07 DIAGNOSIS — R7303 Prediabetes: Secondary | ICD-10-CM

## 2024-07-07 NOTE — Patient Instructions (Addendum)
 For Constipation:1.  Start docusate (Colace) 100 mg by mouth twice a day (stool softener)2.  Drink a lot of water; eat prunes, apricots, lots of fiber3. You can also use Miralax powder (polyethylene glycol) 1 tablespoon (17 g) in 8 ounces of water every day.  If your stools start getting too loose then you can cut back on the Miralax. Did you know, self check-in is quick, easy and protects your privacy? We encourage you, before your next visit with us , to check in from home through your MyChart app and let us  know that you have arrived to your appointment on our Welcome Kiosk located in the main lobby. If you don't have a MyChart account, you can check in on our Welcome Kiosk upon arrival to the office. This enhanced check-in option makes your check-in and rooming experience faster, more efficient and offers you more privacy. And it will give you more time to spend with your provider. Our staff will always be available to help!

## 2024-07-07 NOTE — Progress Notes (Signed)
 [facility] [place]: Outpatient Follow-up Progress NoteAuthor: Zymere Patlan, PAPCP: Deiss, Katharine, MDSUBJECTIVE: Chief Complaint Patient presents with  Follow-up   3 months History of Present Illness:History of Present IllnessJean Jaquitta Bishop  is a 68 year old female presenting for a 67-month follow-up. She has a history of mixed hyperlipidemia and prediabetes. Labs repeated prior to this visit showed improvements in her hemoglobin A1c, which was previously 5.9 and is now 5.6. At the last visit, she was started on low-dose metformin  XR 500 mg once daily, which seems to be working well. Her cholesterol has improved, with total cholesterol decreasing from 251 to 226, triglycerides from 179 to 125, and LDL from 160 to 146. She has been implementing dietary changes and reduced alcohol consumption.She has been adhering to a modified diet, utilizing MyFitnessPal for tracking and following the calendar religiously. Regular exercise has been incorporated into her routine, including Peloton workouts on Monday, Wednesday, and Friday, and walking her dog. She reports feeling better overall and believes the metformin  is aiding in appetite control. However, she experiences constipation as a side effect of the medication, which initially caused diarrhea for two weeks. Hydration and fiber intake need improvement. She expresses a desire to lose more weight. She is scheduled for carpal tunnel surgery in the coming weeks.She is interested in receiving the influenza vaccine today. She is uncertain about her RSV vaccination status.She does not typically have blood pressure issues but notes that her blood pressure was lower during a recent doctor's visit. She exercised this morning and feels slightly fatigued.The 10-year ASCVD risk score (Arnett DK, et al., 2019) is: 5.4%  Values used to calculate the score:    Age: 49 years    Clinically relevant sex: Female     Is Non-Hispanic African American: No    Diabetic: No    Tobacco smoker: No    Systolic Blood Pressure: 110 mmHg    Is BP treated: No    HDL Cholesterol: 58 mg/dL    Total Cholesterol: 773 mg/dLAllergies:Allergies Allergen Reactions  Sulfa Antibiotics    Created by Conversion - 0;   Sodium Lauryl Sulfate Other (See Comments)   Makes mouth feel slimey  No Known Latex Allergy  Medications: Current Outpatient Medications Medication  tolterodine  (DETROL  LA) 4 mg 24 hr capsule  metFORMIN  (GLUCOPHAGE -XR) 500 mg 24 hr tablet  VITAMIN D  PO  probiotic (ALIGN) capsule  EPINEPHrine  (EPIPEN ) 0.3 mg/0.3 mL auto-injector  naproxen sodium (ANAPROX) 220 MG tablet  aspirin 81 MG tablet No current facility-administered medications for this visit. The patient's medications, allergies, medical history, surgical history, family history, social history were reviewed and reconciled with Cy at the time of their appointment and updated in the appropriate sections of eRecord.Review of Systems: As aboveOBJECTIVE: Physical Exam:Vitals:  07/07/24 0932 07/07/24 0959 BP: 132/82 110/70 Pulse: 81  Temp: 36.3 C (97.3 F)  TempSrc: Temporal  SpO2: 95%  Weight: 83.8 kg (184 lb 11.2 oz)  Constitutional: WD/WN, in NAD.Psych: Normal mood, congruent affect, normal eye contact. Speech is articulate and fluid. No signs of anxiety.Recent Lab Results:Results for orders placed or performed during the hospital encounter of 07/04/24 Hemoglobin A1c Result Value Ref Range  Hemoglobin A1C 5.6 <=5.6 % Lipid Panel (Reflex to Direct  LDL if Triglycerides more than 400) Result Value Ref Range  Cholesterol 226 (!) mg/dL  Triglycerides 874 mg/dL  HDL 58 40 - 60 mg/dL  LDL Calculated 853 (!) mg/dL  Non HDL Cholesterol 831 mg/dL  Chol/HDL Ratio 3.9  Comprehensive metabolic  panel Result Value Ref Range  Sodium 140 133 - 145 mmol/L  Potassium  4.3 3.3 - 5.1 mmol/L  Chloride 104 96 - 108 mmol/L  CO2 27 20 - 28 mmol/L  Anion Gap 9 7 - 16  UN 13 6 - 20 mg/dL  Creatinine 9.19 9.48 - 0.95 mg/dL  eGFR BY CREAT 80   Glucose 99 60 - 99 mg/dL  Calcium 9.9 8.6 - 89.7 mg/dL  Total Protein 6.4 6.3 - 7.7 g/dL  Albumin 4.2 3.5 - 5.2 g/dL  Bilirubin,Total 0.6 0.0 - 1.2 mg/dL  AST 23 0 - 35 U/L  ALT 16 0 - 35 U/L  Alk Phos 99 35 - 105 U/L ASSESSMENT & PLAN: Assessment & Plan1. Mixed hyperlipidemia:- Cholesterol levels have shown improvement with total cholesterol decreasing from 251 to 226, triglycerides from 179 to 125, and LDL from 160 to 146.- She has been implementing dietary changes and exercising regularly.- She is advised to continue these lifestyle modifications.- ASCVD risk score has decreased to 5.4% which does not warrant statin therapy.2. Prediabetes:- Hemoglobin A1c has improved from 5.9 to 5.6, which is now within the normal range.- Metformin  500 mg once daily appears to be effective. She reports that metformin  helps curb her appetite but causes constipation.- She is advised to continue taking metformin  and a refill has been provided. If she experiences any issues with bowel movements or wishes to change the formulation of metformin , she should inform the provider.3. Constipation:- Constipation is a reported side effect of metformin . We discussed that this medication is typically more likely to cause diarrhea.- She is advised to increase her water intake and consider adding a fiber supplement such as Konsyl (psyllium) to her diet.- If constipation persists, she may use MiraLAX or Colace as needed.4. Carpal tunnel syndrome:- She is scheduled for carpal tunnel surgery in a few weeks.- She is advised to follow post-operative instructions and continue with any recommended physical therapy.Follow-up: The patient will follow up in 4 months or sooner if needed.Orders:Orders  Placed This Encounter  Influenza Trivalent ADJ PF 40yr+  Lipid Panel (Reflex to Direct  LDL if Triglycerides more than 400)  Hemoglobin A1c Follow up: Future Appointments Date Time Provider Department Center 08/11/2024  9:00 AM Abigail Sauer, PA [facility] None 11/07/2024 10:30 AM Diloreto, David, MD [facility] None 11/10/2024  9:30 AM Deiss, Katharine, MD [facility] None The following patient instructions were reviewed verbally with the patient, printed on their after visit summary and given to them at the end of today's visit. Patient Instructions For Constipation:1.  Start docusate (Colace) 100 mg by mouth twice a day (stool softener)2.  Drink a lot of water; eat prunes, apricots, lots of fiber3. You can also use Miralax powder (polyethylene glycol) 1 tablespoon (17 g) in 8 ounces of water every day.  If your stools start getting too loose then you can cut back on the Miralax. Did you know, self check-in is quick, easy and protects your privacy? We encourage you, before your next visit with us , to check in from home through your MyChart app and let us  know that you have arrived to your appointment on our Welcome Kiosk located in the main lobby. If you don't have a MyChart account, you can check in on our Welcome Kiosk upon arrival to the office. This enhanced check-in option makes your check-in and rooming experience faster, more efficient and offers you more privacy. And it will give you more time to spend with your provider. Our staff will  always be available to help!  SIGNED:Dezi Schaner, PA-CFamily Medicine11/14/2511:08 AMThis note has been dictated using Animal nutritionist.  Reasonable attempts to correct typing mistakes have been done.  Please excuse any inherent inaccuracy that might have escaped.

## 2024-07-25 ENCOUNTER — Other Ambulatory Visit: Payer: Self-pay

## 2024-07-28 ENCOUNTER — Encounter
Admission: RE | Disposition: A | Discharge status: Home / Self Care | Payer: Self-pay | Source: Ambulatory Visit | Attending: Student in an Organized Health Care Education/Training Program

## 2024-07-28 ENCOUNTER — Ambulatory Visit
Admission: RE | Admit: 2024-07-28 | Discharge: 2024-07-28 | Disposition: A | Payer: Medicare (Managed Care) | Attending: Student in an Organized Health Care Education/Training Program | Admitting: Student in an Organized Health Care Education/Training Program

## 2024-07-28 DIAGNOSIS — G5601 Carpal tunnel syndrome, right upper limb: Secondary | ICD-10-CM | POA: Insufficient documentation

## 2024-07-28 HISTORY — PX: PR NEUROPLASTY &/TRANSPOS MEDIAN NRV CARPAL TUNNE: 64721

## 2024-07-28 SURGERY — RELEASE, CARPAL TUNNEL
Anesthesia: Local | Site: Hand | Laterality: Right | Wound class: Clean

## 2024-07-28 SURGICAL SUPPLY — 16 items
CORD ELECTROSURGICAL L12FT FOR BIPOLAR FORCEPS VALLEYLAB (Supply) IMPLANT
DRAPE STERI TOWEL 1010 NL (Drape) ×1 IMPLANT
GLOVE BIOGEL PI INDICATOR UNDER SZ 7 LF (Glove) IMPLANT
GLOVE BIOGEL PI INDICATOR UNDER SZ 8 LF (Glove) ×1 IMPLANT
GLOVE SURG BIOGEL PI ULTRATOUCH SZ 6.5 (Glove) IMPLANT
GLOVE SURG BIOGEL PI ULTRATOUCH SZ 7.5 (Glove) ×1 IMPLANT
GOWN SIRIUS RAGLAN NONREINFORCED XL (Gown) ×1 IMPLANT
PACK CUSTOM HAND PACK CLIFTON CROSSINGS (Pack) ×1 IMPLANT
PREP CHG DYNA-HEX LIQUID 4PCT 4OZ (Supply) ×1 IMPLANT
SOL H2O IRRIG 500ML STERILE BTL (Solution) ×1 IMPLANT
SOL SOD CHL IRRIG 500ML BTL (Solution) ×1 IMPLANT
SPONGE GZ W2XL2IN COT 8 PLY (Dressing) ×1 IMPLANT
SUTR PROLENE MONO 4-0 PS-2 BLUE (Suture) ×1 IMPLANT
SYRINGE 10ML LEUR LOCK STERILE (Syringe) IMPLANT
SYRINGE 5 ML LUER LOCK STERILE (Syringe) ×1 IMPLANT
WRAP SLF ADHER 2IN X 5YD LF NONSTER (Supply) ×1 IMPLANT

## 2024-07-28 NOTE — Discharge Instructions (Signed)
 DISCHARGE INSTRUCTIONSDr. FerraroPhone [phone] CARE:The patient will return to the office in 10 to 14 days for suture removal. INSTRUCTIONS:Notify Dr. Lillard Office [phone]) or after hours call the answering service at [phone]) promptly if you experience any of the following symptoms:Fever of 101 degrees F/38.4 degrees C or higherFoul smelling drainage from incision or dressingPain medication ineffectivePersistent nausea or vomiting into the next dayExcessive swellingBleeding that soaks the bandage that doesn't stop after applying pressure to the wound for 10 minutesPale, blue, or cold fingers/toesLoss of sensation/movement of fingers/toesDIET:Resume your usual diet.CARE OF DRESSING/INCISION:The dry dressing will stay in place for 3 days. After 3 days, the patient can remove the dressing and begin washing normally. Do not soak or scrub the wound.  If you have a splint, the splint must stay clean and dry. Do not remove the splint until you follow up with the doctor or therapy.BATHING/SHOWERING:Cover dressing with a plastic bag. May shower.ACTIVITY:No heavy gripping or lifting. Do not lift anything heavy with the operative hand. Keep your hand elevated so that it is above the level of your heart for 2-3 days. Move your fingers through a full range of motion and gently move your shoulder and elbow to prevent stiffness. They are to practice continual finger and wrist range of motion to prevent stiffness.MEDICATIONS:Resume your routine medications.PAIN MANAGEMENT AFTER SURGERY:Post-operative pain is common, and it is usually worse for the first 24 - 48 hours after surgery. We recommend alternating 600-800 mg ibuprofen/Advil with 1,000 mg acetaminophen /Tylenol  every 6 hours around the clock for the first day, then as needed for pain and swelling. If you take ibuprofen, take Tylenol  3 hours later and continue alternating  every 3 hours that way for best pain coverage. Elevation, moving your arm, hand, and fingers is also important to control swelling. You may also ice over your bandage as long as it stays dry.Ibuprofen is a NSAID. If you cannot take NSAIDs, please take Tylenol  every 4-6 hours, but not more than 4 times per day.Surgical Site Infections FAQsWhat is a Surgical Site infection (SSI)?A surgical site infection is an infection that occurs after surgery in the part of the body where the surgery took place. Most patients who have surgery do not develop an infection. However, Infections develop in about 1 to 3 out of every 100 patients who have surgery. Some of the common symptoms of a surgical site infection are:  Redness and pain around the area where you had surgery  Drainage of cloudy fluid from your surgical wound  Fever Can SSIs be treated? Yes. Most surgical site infections can be treated with antibiotics. The antibiotic given to you depends on the bacteria (germs) causing the Infection. Sometimes patients with SSIs also need another surgery to treat the infection. What are some of the things that hospitals are doing to prevent SSls? To prevent SSIs, doctors, nurses, and other healthcare providers:  Clean their hands and arms up to their elbows with an antiseptic agent just before the surgery.  Clean their hands with soap and water  or an alcohol-based hand rub before and after caring for each patient.  May remove some of your hair Immediately before your surgery using electric clippers If the hair Is in the same area where the procedure will occur. They should not shave you with a razor.  Wear special hair covers, masks, gowns, and gloves during surgery to keep the surgery area clean.  Give you antibiotics before your surgery starts. in most cases, you should get antibiotics within 60 minutes before  the surgery starts, and the antibiotics should be stopped within 24  hours after surgery.  Clean the skin at the site of your surgery with a special soap that kills germs. What can I do to help prevent SSIs? Before your surgery:  Tell your doctor about other medical problems you may have. Health problems such as allergies, diabetes, and obesity could affect your surgery and your treatment.  Quit smoking. Patients who smoke get more Infections. Talk to your doctor about how you can quit before your surgery.  Do not shave near where you will have surgery. Shaving with a razor can Irritate your skin and make it easier to develop an infection. At the time of your surgery:  Speak up if someone tries to shave you with a razor before surgery. Ask why you need to be shaved and talk with your surgeon if you have any concerns.  Ask if you will get antibiotics before surgery. After your surgery:  Make sure that your healthcare providers clean their hands before examining you, either with soap and water  or an alcohol-based hand rub,  If you do not see your healthcare providers wash their hands, please ask them to do so.   Family and friends who visit you should not touch the surgical wound or dressings.  Family and friends should clean their hands with soap and water  or an alcohol-based hand rub before and after visiting you. If you do not see them clean their hands, ask them to clean their hands. What do I need to do when I go home from the hospital?  Before you go home, your doctor or nurse should explain everything you need to know about taking care of your wound. Make sure you understand how to care for your wound before you leave the hospital.  Always clean your hands before and after caring for your wound.  Before you go home, make sure you know who to contact If you have questions or problems after you get home.  If you have any symptoms of an Infection, such as redness and pain at the surgery site, drainage, or fever, call your doctor  immediately. if you have additional questions, please ask your doctor or nurse.

## 2024-07-28 NOTE — Op Note (Signed)
 Operative Note (Surgical Case/Log ID: [id number])   Date of Surgery: 07/28/2024   Surgeons: Surgeons and Role:   * Jillian Veronica Ned, MD - Primary Assistants:     Pre-op Diagnosis: Pre-Op Diagnosis Codes:    * Carpal tunnel syndrome on right [G56.01]   Post-op Diagnosis: Post-Op Diagnosis Codes:   * Carpal tunnel syndrome on right [G56.01]   Procedure(s) Performed: Procedure(s) (LRB):RIGHT RELEASE, CARPAL TUNNEL (Right)   Anesthesia Type: Anesthesia type not filed in the log.       Fluid Totals: No intake/output data recorded.   Estimated Blood Loss: * No values recorded between 07/28/2024 12:00 AM and 07/28/2024 11:51 AM *   Specimens to Pathology:  * No specimens in log *   Temporary Implants:    Packing:           Patient Condition: good   Indications:  Veronica Bishop is a 67 y.o. female with a history of right carpal tunnel syndrome confirmed by electrodiagnostic studies which has failed nonoperative management   Findings (Including unexpected complications): N/a Description of Procedure: OPERATIVE/PROCEDURE REPORT SURGERY/PROCEDURE DATE:  07/28/2024 SURGEON:Larsen Dungan Ned Jillian, MDANESTHESIA: localPRE-OPERATIVE DIAGNOSIS: Right carpal tunnel syndromePOST-OPERATIVE DIAGNOSIS: SameSURGERY/PROCEDURE(S): Right carpal tunnel release, open  Operative Note:  The patient was seen pre-operatively and the risks, benefits and alternatives of surgery were reviewed with the patient.  All questions were answered.  Surgical consent was obtained and the patient was marked.  The correct patient, operative site, procedure to be done was confirmed.  Preoperative local anesthesia was then administered with 7ml of 1% lidocaine /0.5% bupivacaine.  The patient was taken to the operative suite and placed supine on the operative table with arm out on a hand table. Tourniquet was placed on the forearm. The arm was then prepped and  draped in a sterile manner. Time out was performed confirming patient, operative site, operation to be performed and all were in agreement. The arm was exsanguinated with esmarch bandage and tourniquet inflated to .The skin in the palm was incised with a 15 blade in line with the 3rd webspace to the level of the subcutaneous fat.  Superficial veins were then cauterized with bipolar cautery.  Blunt dissection was carried out with scissors to the level of the palmar fascia.  A self-retaining retractor was placed within the wound.  The palmar fascia was clearly identified and was sharply incised with a 15 blade.  The self-retaining retractor was placed deeper within the wound.  The transverse carpal ligament was clearly identified. The transverse carpal ligament was then sharply incised in the midportion, exposing the underlying median nerve.  The self-retaining retractor was then placed deeper within the wound.  The transverse carpal ligament was then sharply incised distally with a 15 blade until the sentinel fat pad was encountered.  At this point, the distal release was completed with the spread of tenotomy scissors, confirming adequate distal release.  The proximal extent of the release was then continued with 15 blade to the level of the antebrachial fascia.  At this point, the wrist was held in neutral flexion, and tenotomy scissors were used to bluntly create a path above and below the transverse carpal ligament and antebrachial fascia proximally.  After confirming the nerve was not adhered to the underlying fascia, a tenotomy scissors were used to sharply release the proximal extent of the transverse carpal ligament and the antebrachial fascia.  A Freer elevator was introduced into the proximal and distal extents of the release confirming that there was in  fact wide release. The wound was then irrigated with normal saline.  The tourniquet was deflated and hemostasis achieved with bipolar  cautery.  The skin was closed with 4-0 prolene suture. All counts were correct. A sterile soft dressing was placed.The patient was then escorted out of the operating room and into PACU in stable condition.ESTIMATED BLOOD LOSS: TIME: : None DISPOSITION: PACUPOST-OPERATIVE PLAN:The patient was informed that the dry dressing will stay in place for 3 days.  After 3 days, the patient can remove the dressing and begin washing normally.  They were informed to not soak or scrub the wound.  They are to practice continual finger and wrist range of motion to prevent stiffness.  They were told to not lift anything heavy with the operative hand.  They will return to the office in 10 to 14 days for suture removal.**Note is dictated utilizing Dragon voice recognition software. Unfortunately, this leads to occasional typographical errors. I apologize in advance if the situation occurs. If questions occur, please do not hesitate to call our office ** Signed:  Fairy Debby Cooler, MD  on 07/28/2024 at 11:51 AM

## 2024-07-31 ENCOUNTER — Encounter: Payer: Self-pay | Admitting: Student in an Organized Health Care Education/Training Program

## 2024-08-10 ENCOUNTER — Other Ambulatory Visit: Payer: Self-pay

## 2024-08-11 ENCOUNTER — Ambulatory Visit: Payer: Medicare (Managed Care) | Attending: Student in an Organized Health Care Education/Training Program

## 2024-08-11 ENCOUNTER — Other Ambulatory Visit: Payer: Self-pay

## 2024-08-11 VITALS — BP 132/94 | HR 76 | Ht 62.0 in | Wt 184.0 lb

## 2024-08-11 DIAGNOSIS — G5601 Carpal tunnel syndrome, right upper limb: Secondary | ICD-10-CM

## 2024-08-11 NOTE — Progress Notes (Signed)
 [facility] Upper Extremity SurgeryCC:  s/p open right carpal tunnel release 12/5/25HPI: Veronica Bishop is 68 y.o., here today for a post-operative visit following the above procedure.  Pain is minimal.  Pre-operative paresthesias are improved.  Pre-operative electrodiagnostics are moderate.   Has been keeping the wound clean and dry. Denies issues with the wound. Answers submitted by the patient for this visit:POST OP on 08/11/2024  9:00 AM with Rollo Bring, PARight wrist (Submitted on 08/11/2024)Chief Complaint: Right Wrist PainWhat is your goal for today's visit?: Post opHandedness: Right HandedDate of onset (if unknown, leave blank):: 12/5/2025Was this the result of an injury?: NoWhat is your current pain level?: 2/10Please describe the quality of your pain: : aching, discomfort, dull acheWhat diagnostic workup have you had for this condition?: no prior workupWhat treatments have you tried for this condition?: acetaminophen , no previous treatmentsProgression since onset: : can't tellIs this a work related condition? : NoCurrent work/school status:: usual activitiesQuestionnaire about: Right Wrist Pain (Submitted on 08/11/2024)Chief Complaint: Right Wrist PainROS: Pertinent review of systems per HPIPertinent Medical and Surgical History, Medications, and Allergies noted above and/or reviewed in the electronic record.Exam:Vital Signs: BP (!) 132/94   Pulse 76   Ht 1.575 m (5' 2)   Wt 83.5 kg (184 lb)   LMP 06/01/2013   BMI 33.65 kg/m General Appearance:  No distress.  Alert and oriented. Mood and Affect:  Cooperative with exam.Right upper Extremity:On focused exam the surgical incision is intact with prolene suture. Surgical wound is benign and sutures removed.  There is the expected amount of swelling with resolving ecchymosis.  No infection.  Some peri-incisional tenderness.  Finger motion is full.  Thumb  opposition is intactSensation intact to light touch throughout radial, median and ulnar distributions. Digits were warm with normal color and brisk capillary refillPalpable radial pulse. Imaging/ Studies: None todayEMG 05/05/24 demonstrates moderate R carpal tunnel syndrome Assessment & Plan:  Veronica Bishop is s/p open right carpal tunnel release.  2 weeks out.  Sutures are removed today without difficulty.  Satisfactory initial postop course.  Reassurance is provided that paresthesias improve over time, and could potentially take up to a year to fully improve.  They are aware that in the setting of severe pre -operative electrodiagnostics and/or co morbidities, recovery can be incomplete. Education regarding wound care, activity, elevation, and scar management is provided.  They understand the above and agree with the plan. All questions answered.Rollo Bring, PA as of 9:10 AM 08/11/2024

## 2024-09-24 ENCOUNTER — Other Ambulatory Visit: Payer: Self-pay | Admitting: Medical

## 2024-09-24 DIAGNOSIS — R7303 Prediabetes: Secondary | ICD-10-CM

## 2024-09-25 NOTE — Telephone Encounter (Signed)
 Last office visit with doctor: 6/9/2025Last office visit with APP: 11/14/2025Patients upcoming appointments:Future Appointments Date Time Provider Department Center 11/07/2024 10:30 AM Genaro Lenis, MD [facility] None 12/20/2024  8:00 AM Deiss, Pierce, MD [facility] None Recent Lab results:GENERAL CHEMISTRY Recent Labs   11/11/250849 08/11/250904 NA 140 141 K 4.3 4.4 CL 104 102 CO2 27 27 GAP 9 12 UN 13 16 CREAT 0.80 0.74 GLU 99 94 CA 9.9 9.9  LIPID PROFILE Recent Labs   11/11/250849 08/11/250904 CHOL 226* 251* TRIG 125 179* HDL 58 59 LDLC 146* 160*  LIVER PROFILE Recent Labs   11/11/250849 08/11/250904 ALT 16 19 AST 23 21 ALK 99 84 TB 0.6 0.5  DIABETES THYROID  Recent Labs   11/11/250849 08/11/250904 HA1C 5.6 5.9*  Recent Labs   08/11/250904 TSH 1.50   Pending/Orders Labs:Lab Frequency Next Occurrence Mammography screening BILATERAL Once 04/05/2024 Lipid Panel (Reflex to Direct  LDL if Triglycerides more than 400) Once 10/07/2024 Hemoglobin A1c Once 10/07/2024  .pcn

## 2024-11-07 ENCOUNTER — Ambulatory Visit: Payer: Medicare (Managed Care) | Admitting: Retina Ophthalmology

## 2024-11-10 ENCOUNTER — Ambulatory Visit: Payer: Medicare (Managed Care) | Admitting: Primary Care

## 2024-12-20 ENCOUNTER — Ambulatory Visit: Payer: Medicare (Managed Care) | Admitting: Primary Care
